# Patient Record
Sex: Male | Born: 1960 | Race: White | Hispanic: No | Marital: Married | Smoking: Former smoker
Health system: Southern US, Community
[De-identification: ages and names within clinical notes are randomized; demographics above are authoritative.]

## PROBLEM LIST (undated history)

## (undated) DIAGNOSIS — E785 Hyperlipidemia, unspecified: Secondary | ICD-10-CM

## (undated) DIAGNOSIS — H3320 Serous retinal detachment, unspecified eye: Secondary | ICD-10-CM

## (undated) DIAGNOSIS — I10 Essential (primary) hypertension: Secondary | ICD-10-CM

## (undated) DIAGNOSIS — I219 Acute myocardial infarction, unspecified: Secondary | ICD-10-CM

## (undated) DIAGNOSIS — J45909 Unspecified asthma, uncomplicated: Secondary | ICD-10-CM

## (undated) DIAGNOSIS — I2510 Atherosclerotic heart disease of native coronary artery without angina pectoris: Secondary | ICD-10-CM

## (undated) DIAGNOSIS — K219 Gastro-esophageal reflux disease without esophagitis: Secondary | ICD-10-CM

## (undated) HISTORY — PX: KNEE SURGERY: SHX244

## (undated) HISTORY — DX: Hyperlipidemia, unspecified: E78.5

## (undated) HISTORY — DX: Unspecified asthma, uncomplicated: J45.909

## (undated) HISTORY — PX: OTHER SURGICAL HISTORY: SHX169

## (undated) HISTORY — DX: Gastro-esophageal reflux disease without esophagitis: K21.9

## (undated) HISTORY — PX: CORONARY ARTERY BYPASS GRAFT: SHX141

## (undated) HISTORY — PX: TONSILLECTOMY: SUR1361

---

## 1999-11-22 ENCOUNTER — Ambulatory Visit (HOSPITAL_BASED_OUTPATIENT_CLINIC_OR_DEPARTMENT_OTHER): Admission: RE | Admit: 1999-11-22 | Discharge: 1999-11-22 | Payer: Self-pay | Admitting: Orthopaedic Surgery

## 2005-07-21 ENCOUNTER — Ambulatory Visit (HOSPITAL_BASED_OUTPATIENT_CLINIC_OR_DEPARTMENT_OTHER): Admission: RE | Admit: 2005-07-21 | Discharge: 2005-07-21 | Payer: Self-pay | Admitting: Internal Medicine

## 2005-07-30 ENCOUNTER — Ambulatory Visit: Payer: Self-pay | Admitting: Internal Medicine

## 2006-05-25 ENCOUNTER — Ambulatory Visit (HOSPITAL_BASED_OUTPATIENT_CLINIC_OR_DEPARTMENT_OTHER): Admission: RE | Admit: 2006-05-25 | Discharge: 2006-05-25 | Payer: Self-pay | Admitting: Orthopedic Surgery

## 2013-06-03 ENCOUNTER — Ambulatory Visit: Payer: BC Managed Care – PPO | Admitting: Emergency Medicine

## 2013-06-03 ENCOUNTER — Ambulatory Visit: Payer: BC Managed Care – PPO

## 2013-06-03 VITALS — BP 156/90 | HR 85 | Temp 97.9°F | Resp 18 | Ht 66.0 in | Wt 161.0 lb

## 2013-06-03 DIAGNOSIS — M25551 Pain in right hip: Secondary | ICD-10-CM

## 2013-06-03 DIAGNOSIS — M25559 Pain in unspecified hip: Secondary | ICD-10-CM

## 2013-06-03 DIAGNOSIS — I10 Essential (primary) hypertension: Secondary | ICD-10-CM

## 2013-06-03 MED ORDER — HYDROCODONE-ACETAMINOPHEN 5-325 MG PO TABS: 1.0000 | ORAL_TABLET | ORAL | Status: DC | PRN

## 2013-06-03 MED ORDER — NAPROXEN SODIUM 550 MG PO TABS: 550.0000 mg | ORAL_TABLET | Freq: Two times a day (BID) | ORAL | Status: AC

## 2013-06-03 MED ORDER — LISINOPRIL-HYDROCHLOROTHIAZIDE 20-12.5 MG PO TABS: 1.0000 | ORAL_TABLET | Freq: Every day | ORAL | Status: DC

## 2013-06-03 NOTE — Patient Instructions (Addendum)
Hip Pain  The hips join the upper legs to the lower pelvis. The bones, cartilage, tendons, and muscles of the hip joint perform a lot of work each day holding your body weight and allowing you to move around.  Hip pain is a common symptom. It can range from a minor ache to severe pain on 1 or both hips. Pain may be felt on the inside of the hip joint near the groin, or the outside near the buttocks and upper thigh. There may be swelling or stiffness as well. It occurs more often when a person walks or performs activity. There are many reasons hip pain can develop.  CAUSES   It is important to work with your caregiver to identify the cause since many conditions can impact the bones, cartilage, muscles, and tendons of the hips. Causes for hip pain include:  · Broken (fractured) bones.  · Separation of the thighbone from the hip socket (dislocation).  · Torn cartilage of the hip joint.  · Swelling (inflammation) of a tendon (tendonitis), the sac within the hip joint (bursitis), or a joint.  · A weakening in the abdominal wall (hernia), affecting the nerves to the hip.  · Arthritis in the hip joint or lining of the hip joint.  · Pinched nerves in the back, hip, or upper thigh.  · A bulging disc in the spine (herniated disc).  · Rarely, bone infection or cancer.  DIAGNOSIS   The location of your hip pain will help your caregiver understand what may be causing the pain. A diagnosis is based on your medical history, your symptoms, results from your physical exam, and results from diagnostic tests. Diagnostic tests may include X-ray exams, a computerized magnetic scan (magnetic resonance imaging, MRI), or bone scan.  TREATMENT   Treatment will depend on the cause of your hip pain. Treatment may include:  · Limiting activities and resting until symptoms improve.  · Crutches or other walking supports (a cane or brace).  · Ice, elevation, and compression.  · Physical therapy or home exercises.  · Shoe inserts or special  shoes.  · Losing weight.  · Medications to reduce pain.  · Undergoing surgery.  HOME CARE INSTRUCTIONS   · Only take over-the-counter or prescription medicines for pain, discomfort, or fever as directed by your caregiver.  · Put ice on the injured area:  · Put ice in a plastic bag.  · Place a towel between your skin and the bag.  · Leave the ice on for 15-20 minutes at a time, 3-4 times a day.  · Keep your leg raised (elevated) when possible to lessen swelling.  · Avoid activities that cause pain.  · Follow specific exercises as directed by your caregiver.  · Sleep with a pillow between your legs on your most comfortable side.  · Record how often you have hip pain, the location of the pain, and what it feels like. This information may be helpful to you and your caregiver.  · Ask your caregiver about returning to work or sports and whether you should drive.  · Follow up with your caregiver for further exams, therapy, or testing as directed.  SEEK MEDICAL CARE IF:   · Your pain or swelling continues or worsens after 1 week.  · You are feeling unwell or have chills.  · You have increasing difficulty with walking.  · You have a loss of sensation or other new symptoms.  · You have questions or concerns.  SEEK   IMMEDIATE MEDICAL CARE IF:   · You cannot put weight on the affected hip.  · You have fallen.  · You have a sudden increase in pain and swelling in your hip.  · You have a fever.  MAKE SURE YOU:   · Understand these instructions.  · Will watch your condition.  · Will get help right away if you are not doing well or get worse.  Document Released: 04/05/2010 Document Revised: 01/08/2012 Document Reviewed: 04/05/2010  ExitCare® Patient Information ©2014 [employer].

## 2013-06-03 NOTE — Progress Notes (Signed)
[facility] [address], [place] [address] [phone]  Date:  06/03/2013   Name:  Cheston Coury Louks   DOB:  05/05/1960   MRN:  [id number]  PCP:  No primary provider on file.    Chief Complaint: right leg pain   History of Present Illness:  Shawn Chang is a 52 y.o. very pleasant male patient who presents with the following:  Pain and unstable feeling in right hip as though the hip is out of place and slips back in as he walks and turns.   Has no history of hip injury or remote problems.  No fever or chills. No weakness in leg.  Pain worse with standing or walking and less with recumbent posture or ASA.  No improvement with over the counter medications or other home remedies.  Denies other complaint or health concern today.  Out of antihypertensive for 6 weeks   There are no active problems to display for this patient.   History reviewed. No pertinent past medical history.  History reviewed. No pertinent past surgical history.  History  Substance Use Topics  . Smoking status: Current Every Day Smoker -- 0.50 packs/day  . Smokeless tobacco: Not on file  . Alcohol Use: Not on file    Family History  Problem Relation Age of Onset  . Heart disease Mother   . Heart disease Father     No Known Allergies  Medication list has been reviewed and updated.  No current outpatient prescriptions on file prior to visit.   No current facility-administered medications on file prior to visit.    Review of Systems:  As per HPI, otherwise negative.    Physical Examination: Filed Vitals:   06/03/13 1522  BP: 156/90  Pulse: 85  Temp: 97.9 F (36.6 C)  Resp: 18   Filed Vitals:   06/03/13 1522  Height: 5\' 6"  (1.676 m)  Weight: 161 lb (73.029 kg)   Body mass index is 26 kg/(m^2). Ideal Body Weight: Weight in (lb) to have BMI = 25: 154.6   GEN: WDWN, NAD, Non-toxic, Alert & Oriented x 3 HEENT: Atraumatic, Normocephalic.  Ears and Nose: No  external deformity. EXTR: No clubbing/cyanosis/edema NEURO: Normal gait.  PSYCH: Normally interactive. Conversant. Not depressed or anxious appearing.  Calm demeanor.  RIGHT hip:  Full active and passive painless ROM.  No crepitus or tenderness  Assessment and Plan: Right hip pain vicodin Anaprox Ortho consult  Signed,  Phillips Odor, MD   [facility] reading (PRIMARY) by  Dr. Dareen Piano.  Negative .

## 2013-12-04 ENCOUNTER — Other Ambulatory Visit: Payer: Self-pay | Admitting: Emergency Medicine

## 2015-02-15 ENCOUNTER — Ambulatory Visit (INDEPENDENT_AMBULATORY_CARE_PROVIDER_SITE_OTHER): Payer: 59 | Admitting: Family Medicine

## 2015-02-15 VITALS — BP 184/98 | HR 96 | Temp 98.0°F | Resp 16 | Ht 65.5 in | Wt 161.4 lb

## 2015-02-15 DIAGNOSIS — I10 Essential (primary) hypertension: Secondary | ICD-10-CM

## 2015-02-15 MED ORDER — LISINOPRIL-HYDROCHLOROTHIAZIDE 20-12.5 MG PO TABS: 1.0000 | ORAL_TABLET | Freq: Every day | ORAL | Status: DC

## 2015-02-15 NOTE — Progress Notes (Signed)
° °  Subjective:  This chart was scribed for [name] MD by Veverly FellsHatice Demirci,scribe, at [facility].  This patient was seen in room 13 and the patient's care was started at 6:08 PM.    Patient ID: Shawn NestAlan J Lauf, male    DOB: 03/23/1961, 54 y.o.   MRN: [id number] Chief Complaint  Patient presents with   Medication Refill   HPI  HPI Comments: Shawn Chang is a 54 y.o. male with a history of hypertension who presents to [facility] for medication refill for his blood pressure which he has been out of for the past two weeks. His Lisinopril has been working well for him and he has no complaints regarding the medication.  Patient states he can tell when his blood pressure goes up.   He is looking for a physician to [facility] at [facility] since he is constantly traveling. 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     Review of Systems  Constitutional: Negative for fever and chills.  Respiratory: Negative for cough and shortness of breath.        Objective:   Physical Exam BP 184/98 mmHg   Pulse 96   Temp(Src) 98 F (36.7 C) (Oral)   Resp 16   Ht 5' 5.5" (1.664 m)   Wt 161 lb 6.4 oz (73.211 kg)   BMI 26.44 kg/m2   SpO2 98% Is a healthy-appearing middle-aged man in no acute distress Neck is supple Chest is clear Heart is regular without murmur or gallop Extremities show no edema     Assessment & Plan:   This chart was scribed in my presence and reviewed by me personally.    ICD-9-CM ICD-10-CM   1. Essential hypertension 401.9 I10 lisinopril-hydrochlorothiazide (PRINZIDE,ZESTORETIC) 20-12.5 MG per tablet     Signed, [name], MD \

## 2015-02-15 NOTE — Patient Instructions (Signed)
You need to have your blood pressure rechecked in 6-8 weeks..   Hypertension Hypertension, commonly called high blood pressure, is when the force of blood pumping through your arteries is too strong. Your arteries are the blood vessels that carry blood from your heart throughout your body. A blood pressure reading consists of a higher number over a lower number, such as 110/72. The higher number (systolic) is the pressure inside your arteries when your heart pumps. The lower number (diastolic) is the pressure inside your arteries when your heart relaxes. Ideally you want your blood pressure below 120/80. Hypertension forces your heart to work harder to pump blood. Your arteries may become narrow or stiff. Having hypertension puts you at risk for heart disease, stroke, and other problems.  RISK FACTORS Some risk factors for high blood pressure are controllable. Others are not.  Risk factors you cannot control include:   Race. You may be at higher risk if you are African American.  Age. Risk increases with age.  Gender. Men are at higher risk than women before age 54 years. After age 54, women are at higher risk than men. Risk factors you can control include:  Not getting enough exercise or physical activity.  Being overweight.  Getting too much fat, sugar, calories, or salt in your diet.  Drinking too much alcohol. SIGNS AND SYMPTOMS Hypertension does not usually cause signs or symptoms. Extremely high blood pressure (hypertensive crisis) may cause headache, anxiety, shortness of breath, and nosebleed. DIAGNOSIS  To check if you have hypertension, your health care provider will measure your blood pressure while you are seated, with your arm held at the level of your heart. It should be measured at least twice using the same arm. Certain conditions can cause a difference in blood pressure between your right and left arms. A blood pressure reading that is higher than normal on one occasion  does not mean that you need treatment. If one blood pressure reading is high, ask your health care provider about having it checked again. TREATMENT  Treating high blood pressure includes making lifestyle changes and possibly taking medicine. Living a healthy lifestyle can help lower high blood pressure. You may need to change some of your habits. Lifestyle changes may include:  Following the DASH diet. This diet is high in fruits, vegetables, and whole grains. It is low in salt, red meat, and added sugars.  Getting at least 2 hours of brisk physical activity every week.  Losing weight if necessary.  Not smoking.  Limiting alcoholic beverages.  Learning ways to reduce stress. If lifestyle changes are not enough to get your blood pressure under control, your health care provider may prescribe medicine. You may need to take more than one. Work closely with your health care provider to understand the risks and benefits. HOME CARE INSTRUCTIONS  Have your blood pressure rechecked as directed by your health care provider.   Take medicines only as directed by your health care provider. Follow the directions carefully. Blood pressure medicines must be taken as prescribed. The medicine does not work as well when you skip doses. Skipping doses also puts you at risk for problems.   Do not smoke.   Monitor your blood pressure at home as directed by your health care provider. SEEK MEDICAL CARE IF:   You think you are having a reaction to medicines taken.  You have recurrent headaches or feel dizzy.  You have swelling in your ankles.  You have trouble with your vision.  SEEK IMMEDIATE MEDICAL CARE IF:  You develop a severe headache or confusion.  You have unusual weakness, numbness, or feel faint.  You have severe chest or abdominal pain.  You vomit repeatedly.  You have trouble breathing. MAKE SURE YOU:   Understand these instructions.  Will watch your condition.  Will get  help right away if you are not doing well or get worse. Document Released: 10/16/2005 Document Revised: 03/02/2014 Document Reviewed: 08/08/2013 [facility] Patient Information 2015 [place], [place]. This information is not intended to replace advice given to you by your health care provider. Make sure you discuss any questions you have with your health care provider.

## 2015-08-30 ENCOUNTER — Emergency Department (HOSPITAL_COMMUNITY): Payer: PRIVATE HEALTH INSURANCE

## 2015-08-30 ENCOUNTER — Encounter (HOSPITAL_COMMUNITY): Payer: Self-pay | Admitting: Emergency Medicine

## 2015-08-30 ENCOUNTER — Emergency Department (HOSPITAL_COMMUNITY)
Admission: EM | Admit: 2015-08-30 | Discharge: 2015-08-31 | Disposition: A | Discharge status: Home / Self Care | Payer: PRIVATE HEALTH INSURANCE | Attending: Emergency Medicine | Admitting: Emergency Medicine

## 2015-08-30 DIAGNOSIS — R1013 Epigastric pain: Secondary | ICD-10-CM | POA: Diagnosis not present

## 2015-08-30 DIAGNOSIS — Z720 Tobacco use: Secondary | ICD-10-CM | POA: Insufficient documentation

## 2015-08-30 DIAGNOSIS — R142 Eructation: Secondary | ICD-10-CM | POA: Insufficient documentation

## 2015-08-30 DIAGNOSIS — I10 Essential (primary) hypertension: Secondary | ICD-10-CM | POA: Diagnosis not present

## 2015-08-30 DIAGNOSIS — Z79899 Other long term (current) drug therapy: Secondary | ICD-10-CM | POA: Diagnosis not present

## 2015-08-30 DIAGNOSIS — R002 Palpitations: Secondary | ICD-10-CM | POA: Diagnosis not present

## 2015-08-30 DIAGNOSIS — R110 Nausea: Secondary | ICD-10-CM | POA: Insufficient documentation

## 2015-08-30 DIAGNOSIS — R432 Parageusia: Secondary | ICD-10-CM | POA: Insufficient documentation

## 2015-08-30 DIAGNOSIS — R079 Chest pain, unspecified: Secondary | ICD-10-CM | POA: Diagnosis present

## 2015-08-30 HISTORY — DX: Essential (primary) hypertension: I10

## 2015-08-30 LAB — BASIC METABOLIC PANEL
ANION GAP: 9 (ref 5–15)
BUN: 17 mg/dL (ref 6–20)
CALCIUM: 9.2 mg/dL (ref 8.9–10.3)
CHLORIDE: 100 mmol/L — AB (ref 101–111)
CO2: 30 mmol/L (ref 22–32)
Creatinine, Ser: 1.42 mg/dL — ABNORMAL HIGH (ref 0.61–1.24)
GFR calc Af Amer: >60 mL/min (ref >60)
GFR calc non Af Amer: 55 mL/min — ABNORMAL LOW (ref >60)
GLUCOSE: 106 mg/dL — AB (ref 65–99)
Potassium: 3.4 mmol/L — ABNORMAL LOW (ref 3.5–5.1)
Sodium: 139 mmol/L (ref 135–145)

## 2015-08-30 LAB — CBC
HCT: 37.4 % — ABNORMAL LOW (ref 39.0–52.0)
HEMOGLOBIN: 12.4 g/dL — AB (ref 13.0–17.0)
MCH: 31.3 pg (ref 26.0–34.0)
MCHC: 33.2 g/dL (ref 30.0–36.0)
MCV: 94.4 fL (ref 78.0–100.0)
PLATELETS: 213 10*3/uL (ref 150–400)
RBC: 3.96 MIL/uL — AB (ref 4.22–5.81)
RDW: 13.3 % (ref 11.5–15.5)
WBC: 10.4 10*3/uL (ref 4.0–10.5)

## 2015-08-30 LAB — I-STAT TROPONIN, ED: TROPONIN I, POC: 0 ng/mL (ref 0.00–0.08)

## 2015-08-30 MED ORDER — ACETAMINOPHEN 500 MG PO TABS
1000.0000 mg | ORAL_TABLET | Freq: Once | ORAL | Status: AC
  Administered 2015-08-31: 1000 mg via ORAL
  Filled 2015-08-30: qty 2

## 2015-08-30 MED ORDER — ALUM & MAG HYDROXIDE-SIMETH 200-200-20 MG/5ML PO SUSP
15.0000 mL | Freq: Once | ORAL | Status: AC
  Administered 2015-08-31: 15 mL via ORAL
  Filled 2015-08-30: qty 30

## 2015-08-30 MED ORDER — ASPIRIN 81 MG PO CHEW
324.0000 mg | CHEWABLE_TABLET | Freq: Once | ORAL | Status: AC
  Administered 2015-08-31: 324 mg via ORAL
  Filled 2015-08-30: qty 4

## 2015-08-30 MED ORDER — NITROGLYCERIN 0.4 MG SL SUBL: 0.4000 mg | SUBLINGUAL_TABLET | SUBLINGUAL | Status: DC | PRN

## 2015-08-30 MED ORDER — LIDOCAINE VISCOUS 2 % MT SOLN
15.0000 mL | Freq: Once | OROMUCOSAL | Status: AC
  Administered 2015-08-31: 15 mL via OROMUCOSAL
  Filled 2015-08-30: qty 15

## 2015-08-30 NOTE — ED Notes (Signed)
Pt states he was at work today and 1st and 2nd finger of right hand began feeling numb. When he looked at his fingers, they were both white and it took about 30 minutes of rubbing fingers to get color back. Pt also reports metallic taste in mouth when chest pain radiated to jaw/teeth.

## 2015-08-30 NOTE — ED Notes (Signed)
Pt. reports central chest pressure " heart burn" , palpitations with SOB and dry cough onset today , denies nausea or diaphoresis .

## 2015-08-30 NOTE — ED Provider Notes (Signed)
CSN: [id number]   Arrival date & time 08/30/15 2146  History  By signing my name below, I, Shawn Chang, attest that this documentation has been prepared under the direction and in the presence of Melene Plan, DO. Electronically Signed: Bethel Chang, ED Scribe. 08/31/2015. 1:44 AM.  Chief Complaint  Patient presents with  . Chest Pain    HPI The history is provided by the patient. No language interpreter was used.   Shawn Chang is a 54 y.o. male with history of HTN who presents to the Emergency Department complaining of intermittent central chest pain with onset tonight around 9:30 PM. He describes the non-radiating pain as a "rolling pressure" and rates it 7/10 in severity with no modifying factors. This pain is not pleuritic in nature and is different than heartburn. Pt notes taking Prilosec daily.  Associated symptoms include a metallic taste is the mouth and dental pain at the onset of chest pain,racing palpitations, intermittent SOB, belching, and nausea. He notes a tremendous leg cramp 5 days ago that woke him from sleeping. Pt denies diaphoresis, vomiting, LE swelling.  Tobacco+. Family history of MI (an uncle at 1, father at 44, brother at 24). 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 No personal history of DVT/PE.   Past Medical History  Diagnosis Date  . Hypertension     Past Surgical History  Procedure Laterality Date  . Knee surgery    . Shoulder surgery      Family History  Problem Relation Age of Onset  . Heart disease Mother   . Heart disease Father     Social History  Substance Use Topics  . Smoking status: Current Every Day Smoker -- 0.00 packs/day    Types: Cigarettes  . Smokeless tobacco: None  . Alcohol Use: Yes     Review of Systems  Constitutional: Negative for fever, chills and diaphoresis.  HENT: Negative for congestion and facial swelling.   Eyes: Negative for discharge and visual disturbance.  Respiratory: Negative for shortness of breath.    Cardiovascular: Positive for chest pain and palpitations. Negative for leg swelling.  Gastrointestinal: Positive for nausea. Negative for vomiting, abdominal pain and diarrhea.       Belching Metallic taste in the mouth  Musculoskeletal: Negative for myalgias and arthralgias.  Skin: Negative for color change and rash.  Neurological: Negative for tremors, syncope and headaches.  Psychiatric/Behavioral: Negative for confusion and dysphoric mood.   Home Medications   Prior to Admission medications   Medication Sig Start Date End Date Taking? Authorizing Provider  Aspirin-Acetaminophen-Caffeine (GOODY HEADACHE PO) Take 1 packet by mouth every 3 (three) hours as needed (headache).   Yes Historical Provider, MD  cetirizine (ZYRTEC) 10 MG tablet Take 10 mg by mouth daily.   Yes Historical Provider, MD  lisinopril-hydrochlorothiazide (PRINZIDE,ZESTORETIC) 20-12.5 MG per tablet Take 1 tablet by mouth daily. 02/15/15  Yes Elvina Sidle, MD  omeprazole (PRILOSEC) 10 MG capsule Take 10 mg by mouth daily.   Yes Historical Provider, MD    Allergies  Review of patient's allergies indicates no known allergies.  Triage Vitals: BP 129/83 mmHg  Pulse 87  Temp(Src) 98.3 F (36.8 C) (Oral)  Resp 12  Ht  (1.676 m)  Wt 160 lb (72.576 kg)  BMI 25.84 kg/m2  SpO2 100%  Physical Exam  Constitutional: He is oriented to person, place, and time. He appears well-developed and well-nourished.  HENT:  Head: Normocephalic and atraumatic.  Eyes: EOM are normal. Pupils are equal,  round, and reactive to light.  Neck: Normal range of motion. Neck supple. No JVD present.  Cardiovascular: Normal rate and regular rhythm.  Exam reveals no gallop and no friction rub.   No murmur heard. DP Pulses 2+ and equal Pain is reproducible with palpation to the epigastrium.  Pulmonary/Chest: No respiratory distress. He has no wheezes.  Abdominal: Soft. He exhibits no distension. There is no tenderness. There is no  rebound and no guarding.  Musculoskeletal: Normal range of motion.  No noted edema.  Neurological: He is alert and oriented to person, place, and time.  Skin: No rash noted. No pallor.  Psychiatric: He has a normal mood and affect. His behavior is normal.  Nursing note and vitals reviewed.   ED Course  Procedures   DIAGNOSTIC STUDIES: Oxygen Saturation is 100% on RA, normal by my interpretation.    COORDINATION OF CARE: 11:56 PM Discussed treatment plan which includes CXR, lab work, EKG, a GI cocktail, NTG, and aspirin with pt at bedside and pt agreed to plan  1:43 AM I re-evaluated the patient and informed him of his negative second troponin.  Labs Reviewed  BASIC METABOLIC PANEL - Abnormal; Notable for the following:    Potassium 3.4 (*)    Chloride 100 (*)    Glucose, Bld 106 (*)    Creatinine, Ser 1.42 (*)    GFR calc non Af Amer 55 (*)    All other components within normal limits  CBC - Abnormal; Notable for the following:    RBC 3.96 (*)    Hemoglobin 12.4 (*)    HCT 37.4 (*)    All other components within normal limits  D-DIMER, QUANTITATIVE (NOT AT [facility])  I-STAT TROPOININ, ED  Rosezena Sensor, ED    Imaging Review Dg Chest 2 View  08/30/2015  CLINICAL DATA:  Mid chest pain and shortness of breath for 1 day EXAM: CHEST - 2 VIEW COMPARISON:  None. FINDINGS: The heart size and mediastinal contours are within normal limits. Both lungs are clear. The visualized skeletal structures are unremarkable. IMPRESSION: No active disease. Electronically Signed   By: Alcide Clever M.D.   On: 08/30/2015 22:53    I personally reviewed and evaluated these images and lab results as a part of my medical decision-making.   EKG Interpretation  Date/Time:  Monday August 30 2015 21:49:21 EDT Ventricular Rate:  109 PR Interval:  164 QRS Duration: 106 QT Interval:  328 QTC Calculation: 441 R Axis:   54 Text Interpretation:  Sinus tachycardia Otherwise normal ECG No old tracing to  compare Confirmed by Mayleigh Tetrault MD, DANIEL [phone]) on 08/30/2015 11:46:57 PM    MDM   Final diagnoses:  Chest pain, unspecified chest pain type    54 yo M with a chief complaint chest pain. This pain is epigastric comes and goes squeezing in nature. This occurred right after eating [place] food. Improved with belching. Pain is reproduced on palpation to the epigastrium. Patient with significant risk factors, will obtain a delta troponin. Patient also with significant travel history d-dimer negative. Given GI cocktail, with complete resolution of his pain. Delta troponin negative.  5:11 AM:  I have discussed the diagnosis/risks/treatment options with the patient and believe the pt to be eligible for discharge home to follow-up with PCP. We also discussed returning to the ED immediately if new or worsening sx occur. We discussed the sx which are most concerning (e.g., sudden worsening pain, fever, inability to tolerate by mouth) that necessitate immediate return.  Medications administered to the patient during their visit and any new prescriptions provided to the patient are listed below.  Medications given during this visit Medications  nitroGLYCERIN (NITROSTAT) SL tablet 0.4 mg (not administered)  alum & mag hydroxide-simeth (MAALOX/MYLANTA) 200-200-20 MG/5ML suspension 15 mL (15 mLs Oral Given 08/31/15 0019)  lidocaine (XYLOCAINE) 2 % viscous mouth solution 15 mL (15 mLs Mouth/Throat Given 08/31/15 0019)  aspirin chewable tablet 324 mg (324 mg Oral Given 08/31/15 0019)  acetaminophen (TYLENOL) tablet 1,000 mg (1,000 mg Oral Given 08/31/15 0018)    Discharge Medication List as of 08/31/2015  1:41 AM      The patient appears reasonably screen and/or stabilized for discharge and I doubt any other medical condition or other [facility] requiring further screening, evaluation, or treatment in the ED at this time prior to discharge.   I personally performed the services described in this documentation, which  was scribed in my presence. The recorded information has been reviewed and is accurate.     Melene Planan Kyli Sorter, DO 08/31/15 [phone]

## 2015-08-31 ENCOUNTER — Telehealth: Payer: Self-pay

## 2015-08-31 LAB — D-DIMER, QUANTITATIVE (NOT AT ARMC): D DIMER QUANT: 0.3 ug{FEU}/mL (ref 0.00–0.48)

## 2015-08-31 LAB — I-STAT TROPONIN, ED: TROPONIN I, POC: 0.01 ng/mL (ref 0.00–0.08)

## 2015-08-31 NOTE — Telephone Encounter (Signed)
Patient is calling because he would like a cardiology referral to to see Dr. Jacinto HalimGanji. Patient doesn't know the name of the office.  [phone]

## 2015-08-31 NOTE — Discharge Instructions (Signed)
Try Zantac twice a day for this pain. Nonspecific Chest Pain  Chest pain can be caused by many different conditions. There is always a chance that your pain could be related to something serious, such as a heart attack or a blood clot in your lungs. Chest pain can also be caused by conditions that are not life-threatening. If you have chest pain, it is very important to follow up with your health care provider. CAUSES  Chest pain can be caused by:  Heartburn.  Pneumonia or bronchitis.  Anxiety or stress.  Inflammation around your heart (pericarditis) or lung (pleuritis or pleurisy).  A blood clot in your lung.  A collapsed lung (pneumothorax). It can develop suddenly on its own (spontaneous pneumothorax) or from trauma to the chest.  Shingles infection (varicella-zoster virus).  Heart attack.  Damage to the bones, muscles, and cartilage that make up your chest wall. This can include:  Bruised bones due to injury.  Strained muscles or cartilage due to frequent or repeated coughing or overwork.  Fracture to one or more ribs.  Sore cartilage due to inflammation (costochondritis). RISK FACTORS  Risk factors for chest pain may include:  Activities that increase your risk for trauma or injury to your chest.  Respiratory infections or conditions that cause frequent coughing.  Medical conditions or overeating that can cause heartburn.  Heart disease or family history of heart disease.  Conditions or health behaviors that increase your risk of developing a blood clot.  Having had chicken pox (varicella zoster). SIGNS AND SYMPTOMS Chest pain can feel like:  Burning or tingling on the surface of your chest or deep in your chest.  Crushing, pressure, aching, or squeezing pain.  Dull or sharp pain that is worse when you move, cough, or take a deep breath.  Pain that is also felt in your back, neck, shoulder, or arm, or pain that spreads to any of these areas. Your chest  pain may come and go, or it may stay constant. DIAGNOSIS Lab tests or other studies may be needed to find the cause of your pain. Your health care provider may have you take a test called an ambulatory ECG (electrocardiogram). An ECG records your heartbeat patterns at the time the test is performed. You may also have other tests, such as:  Transthoracic echocardiogram (TTE). During echocardiography, sound waves are used to create a picture of all of the heart structures and to look at how blood flows through your heart.  Transesophageal echocardiogram (TEE).This is a more advanced imaging test that obtains images from inside your body. It allows your health care provider to see your heart in finer detail.  Cardiac monitoring. This allows your health care provider to monitor your heart rate and rhythm in real time.  Holter monitor. This is a portable device that records your heartbeat and can help to diagnose abnormal heartbeats. It allows your health care provider to track your heart activity for several days, if needed.  Stress tests. These can be done through exercise or by taking medicine that makes your heart beat more quickly.  Blood tests.  Imaging tests. TREATMENT  Your treatment depends on what is causing your chest pain. Treatment may include:  Medicines. These may include:  Acid blockers for heartburn.  Anti-inflammatory medicine.  Pain medicine for inflammatory conditions.  Antibiotic medicine, if an infection is present.  Medicines to dissolve blood clots.  Medicines to treat coronary artery disease.  Supportive care for conditions that do not require medicines.  This may include:  Resting.  Applying heat or cold packs to injured areas.  Limiting activities until pain decreases. HOME CARE INSTRUCTIONS  If you were prescribed an antibiotic medicine, finish it all even if you start to feel better.  Avoid any activities that bring on chest pain.  Do not use any  tobacco products, including cigarettes, chewing tobacco, or electronic cigarettes. If you need help quitting, ask your health care provider.  Do not drink alcohol.  Take medicines only as directed by your health care provider.  Keep all follow-up visits as directed by your health care provider. This is important. This includes any further testing if your chest pain does not go away.  If heartburn is the cause for your chest pain, you may be told to keep your head raised (elevated) while sleeping. This reduces the chance that acid will go from your stomach into your esophagus.  Make lifestyle changes as directed by your health care provider. These may include:  Getting regular exercise. Ask your health care provider to suggest some activities that are safe for you.  Eating a heart-healthy diet. A registered dietitian can help you to learn healthy eating options.  Maintaining a healthy weight.  Managing diabetes, if necessary.  Reducing stress. SEEK MEDICAL CARE IF:  Your chest pain does not go away after treatment.  You have a rash with blisters on your chest.  You have a fever. SEEK IMMEDIATE MEDICAL CARE IF:   Your chest pain is worse.  You have an increasing cough, or you cough up blood.  You have severe abdominal pain.  You have severe weakness.  You faint.  You have chills.  You have sudden, unexplained chest discomfort.  You have sudden, unexplained discomfort in your arms, back, neck, or jaw.  You have shortness of breath at any time.  You suddenly start to sweat, or your skin gets clammy.  You feel nauseous or you vomit.  You suddenly feel light-headed or dizzy.  Your heart begins to beat quickly, or it feels like it is skipping beats. These symptoms may represent a serious problem that is an emergency. Do not wait to see if the symptoms will go away. Get medical help right away. Call your local emergency services (911 in the [place] Do not drive yourself  to the hospital.   This information is not intended to replace advice given to you by your health care provider. Make sure you discuss any questions you have with your health care provider.   Document Released: 07/26/2005 Document Revised: 11/06/2014 Document Reviewed: 05/22/2014 Elsevier Interactive Patient Education [employer]

## 2015-09-01 NOTE — Telephone Encounter (Signed)
Left message for pt to call back.  Why does he want referral? Has not been here in months. He may need to RTC.

## 2015-09-01 NOTE — Telephone Encounter (Signed)
Patient returned call. He was seen in ED, thought he was having a MI. They said there was a 99% chance it wasn't and a 1% chance it was. He was given some names of cardiologist so he could get in with them.

## 2015-09-09 ENCOUNTER — Encounter: Payer: Self-pay | Admitting: Physician Assistant

## 2015-09-09 ENCOUNTER — Ambulatory Visit (INDEPENDENT_AMBULATORY_CARE_PROVIDER_SITE_OTHER): Payer: Managed Care, Other (non HMO) | Admitting: Physician Assistant

## 2015-09-09 VITALS — BP 113/71 | HR 110 | Temp 98.8°F | Resp 18 | Ht 66.5 in | Wt 157.4 lb

## 2015-09-09 DIAGNOSIS — Z87898 Personal history of other specified conditions: Secondary | ICD-10-CM

## 2015-09-09 DIAGNOSIS — Z09 Encounter for follow-up examination after completed treatment for conditions other than malignant neoplasm: Secondary | ICD-10-CM

## 2015-09-09 DIAGNOSIS — Z299 Encounter for prophylactic measures, unspecified: Secondary | ICD-10-CM

## 2015-09-09 DIAGNOSIS — I209 Angina pectoris, unspecified: Secondary | ICD-10-CM | POA: Insufficient documentation

## 2015-09-09 DIAGNOSIS — R062 Wheezing: Secondary | ICD-10-CM | POA: Diagnosis not present

## 2015-09-09 DIAGNOSIS — Z418 Encounter for other procedures for purposes other than remedying health state: Secondary | ICD-10-CM

## 2015-09-09 DIAGNOSIS — Z139 Encounter for screening, unspecified: Secondary | ICD-10-CM | POA: Diagnosis not present

## 2015-09-09 LAB — LIPID PANEL
CHOLESTEROL: 105 mg/dL — AB (ref 125–200)
HDL: 31 mg/dL — ABNORMAL LOW (ref >=40)
LDL Cholesterol: 58 mg/dL (ref <130)
Total CHOL/HDL Ratio: 3.4 Ratio (ref <=5.0)
Triglycerides: 80 mg/dL (ref <150)
VLDL: 16 mg/dL (ref <30)

## 2015-09-09 LAB — COMPLETE METABOLIC PANEL WITH GFR
ALT: 17 U/L (ref 9–46)
AST: 23 U/L (ref 10–35)
Albumin: 4 g/dL (ref 3.6–5.1)
Alkaline Phosphatase: 63 U/L (ref 40–115)
BUN: 16 mg/dL (ref 7–25)
CHLORIDE: 103 mmol/L (ref 98–110)
CO2: 27 mmol/L (ref 20–31)
CREATININE: 1.1 mg/dL (ref 0.70–1.33)
Calcium: 9.1 mg/dL (ref 8.6–10.3)
GFR, Est African American: 88 mL/min (ref >=60)
GFR, Est Non African American: 76 mL/min (ref >=60)
Glucose, Bld: 93 mg/dL (ref 65–99)
POTASSIUM: 3.8 mmol/L (ref 3.5–5.3)
Sodium: 137 mmol/L (ref 135–146)
Total Bilirubin: 0.4 mg/dL (ref 0.2–1.2)
Total Protein: 6.5 g/dL (ref 6.1–8.1)

## 2015-09-09 LAB — POCT GLYCOSYLATED HEMOGLOBIN (HGB A1C): HEMOGLOBIN A1C: 5.2

## 2015-09-09 LAB — HEMOGLOBIN A1C: HEMOGLOBIN A1C: 5.2 % (ref 4.0–6.0)

## 2015-09-09 LAB — TSH: TSH: 0.597 u[IU]/mL (ref 0.350–4.500)

## 2015-09-09 MED ORDER — NITROGLYCERIN 0.3 MG SL SUBL: 0.3000 mg | SUBLINGUAL_TABLET | SUBLINGUAL | Status: DC | PRN

## 2015-09-09 MED ORDER — ALBUTEROL SULFATE HFA 108 (90 BASE) MCG/ACT IN AERS: 2.0000 | INHALATION_SPRAY | Freq: Four times a day (QID) | RESPIRATORY_TRACT | Status: DC | PRN

## 2015-09-09 MED ORDER — DOXYCYCLINE HYCLATE 100 MG PO CAPS: 100.0000 mg | ORAL_CAPSULE | Freq: Two times a day (BID) | ORAL | Status: AC

## 2015-09-09 MED ORDER — ALBUTEROL SULFATE (2.5 MG/3ML) 0.083% IN NEBU
2.5000 mg | INHALATION_SOLUTION | Freq: Once | RESPIRATORY_TRACT | Status: AC
  Administered 2015-09-09: 2.5 mg via RESPIRATORY_TRACT

## 2015-09-09 MED ORDER — ASPIRIN 81 MG PO TABS: 81.0000 mg | ORAL_TABLET | Freq: Every day | ORAL | Status: DC

## 2015-09-09 MED ORDER — IPRATROPIUM BROMIDE 0.02 % IN SOLN
0.5000 mg | Freq: Once | RESPIRATORY_TRACT | Status: AC
  Administered 2015-09-09: 0.5 mg via RESPIRATORY_TRACT

## 2015-09-09 MED ORDER — ALBUTEROL SULFATE (2.5 MG/3ML) 0.083% IN NEBU: 2.5000 mg | INHALATION_SOLUTION | Freq: Four times a day (QID) | RESPIRATORY_TRACT | Status: DC | PRN

## 2015-09-09 MED ORDER — FLUTICASONE PROPIONATE 50 MCG/ACT NA SUSP: 2.0000 | Freq: Every day | NASAL | Status: DC

## 2015-09-09 NOTE — Progress Notes (Signed)
09/09/2015 at 2:13 PM  Shawn Chang / DOB: Sep 26, 1961 / MRN: [id number]  The patient has Essential hypertension and History of chest pain on his problem list.  SUBJECTIVE  Shawn Chang is a 54 y.o. male who complains of need for follow up after a visit to the ED on 09/01/15.  He requested a referral to cardiology via phone messaging and was advised by staff to come into the clinic.  He is 54 years old and there is a paucity of data regarding his health.  He has a history of HTN and was placed on Lisinopril-HCTZ 20-12.5 which appears to be a historical medication and refilled by this office.  He reports a family history of CAD in his mother and father.  He is a current smoker. BMI ranges from 25-26 since 2014.  Lipids have never been quantified. Most recent blood sugar at 106, no A1C in [facility].  Per [facility] he is behind on his vaccinations.  No colonoscopy as of yet. Most recent visit to the ED as follows:  "Shawn Chang is a 54 y.o. male with history of HTN who presents to the Emergency Department complaining of intermittent central chest pain with onset tonight around 9:30 PM. He describes the non-radiating pain as a "rolling pressure" and rates it 7/10 in severity with no modifying factors. This pain is not pleuritic in nature and is different than heartburn. Pt notes taking Prilosec daily. Associated symptoms include a metallic taste is the mouth and dental pain at the onset of chest pain,racing palpitations, intermittent SOB, belching, and nausea. He notes a tremendous leg cramp 5 days ago that woke him from sleeping. Pt denies diaphoresis, vomiting, LE swelling. Tobacco+. Family history of MI (an uncle at 78, father at 30, brother at 13). 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 No personal history of DVT/PE".    He was given ASA 324, Tylenol, and a GI cocktail with complete resolution of his symptoms at that time.   Since his visit to the ED he has been complaining of shortness of breath and  fatigue.  Reports that when he travels he ty he can typically walk at the airport with a 40 lbs pack without difficulty.  Since his visit to the ED he reports he has been riding the tram as he gets short of breath with normal physical activity.   He complains of a head cold and chest congestion at this time that has been occuring for roughly 4 days now.  Denies fever, chills, nausea, facial pressure.  Has a 40 pack year history of smoking.    Complains that his fingers have been turning white when he is outside.  Reports the digits burn and tingle, particularly upon rewarming which takes roughly thirty minutes.    He  has a past medical history of Hypertension.    Medications reviewed and updated by myself where necessary, and exist elsewhere in the encounter.   Shawn Chang has No Known Allergies. He  reports that he has been smoking Cigarettes.  He does not have any smokeless tobacco history on file. He reports that he drinks alcohol. He reports that he does not use illicit drugs. He  has no sexual activity history on file. The patient  has past surgical history that includes Knee surgery and shoulder surgery.  His family history includes Heart disease in his father and mother.  Review of Systems  Constitutional: Negative for fever and chills.  Respiratory: Negative for shortness  of breath.   Cardiovascular: Negative for chest pain.  Gastrointestinal: Negative for nausea and abdominal pain.  Genitourinary: Negative.   Skin: Negative for rash.  Neurological: Negative for dizziness and headaches.    OBJECTIVE  His  height is 5' 6.5" (1.689 m) and weight is 157 lb 6.4 oz (71.396 kg). His oral temperature is 98.8 F (37.1 C). His blood pressure is 113/71 and his pulse is 110. His respiration is 18 and oxygen saturation is 95%.  The patient's body mass index is 25.03 kg/(m^2).  Physical Exam  Vitals reviewed. Constitutional: He is oriented to person, place, and time. He appears  well-developed. No distress.  Eyes: EOM are normal. Pupils are equal, round, and reactive to light. No scleral icterus.  Neck: Normal range of motion.  Cardiovascular: Normal rate and regular rhythm.   Respiratory: Effort normal and breath sounds normal.  GI: He exhibits no distension.  Musculoskeletal: Normal range of motion.  Neurological: He is alert and oriented to person, place, and time. No cranial nerve deficit.  Skin: Skin is warm and dry. No rash noted. He is not diaphoretic.  Psychiatric: He has a normal mood and affect.     EXAM: CHEST - 2 VIEW  COMPARISON: None.  FINDINGS: The heart size and mediastinal contours are within normal limits. Both lungs are clear. The visualized skeletal structures are unremarkable.  IMPRESSION: No active disease.  EKG 10/31: Sinus tachy with rate of 109.  Questionable ST segment elevation of leads I, III, and AVF.    Recent Results (from the past 2160 hour(s))  D-dimer, quantitative (not at [facility])     Status: None   Collection Time: 08/30/15 12:08 AM  Result Value Ref Range   D-Dimer, Quant 0.30 0.00 - 0.48 ug/mL-FEU    Comment:        AT THE INHOUSE ESTABLISHED CUTOFF VALUE OF 0.48 ug/mL FEU, THIS ASSAY HAS BEEN DOCUMENTED IN THE LITERATURE TO HAVE A SENSITIVITY AND NEGATIVE PREDICTIVE VALUE OF AT LEAST 98 TO 99%.  THE TEST RESULT SHOULD BE CORRELATED WITH AN ASSESSMENT OF THE CLINICAL PROBABILITY OF DVT / VTE.   Basic metabolic panel     Status: Abnormal   Collection Time: 08/30/15 10:16 PM  Result Value Ref Range   Sodium 139 135 - 145 mmol/L   Potassium 3.4 (L) 3.5 - 5.1 mmol/L   Chloride 100 (L) 101 - 111 mmol/L   CO2 30 22 - 32 mmol/L   Glucose, Bld 106 (H) 65 - 99 mg/dL   BUN 17 6 - 20 mg/dL   Creatinine, Ser 1.42 (H) 0.61 - 1.24 mg/dL   Calcium 9.2 8.9 - 10.3 mg/dL   GFR calc non Af Amer 55 (L) >60 mL/min   GFR calc Af Amer >60 >60 mL/min    Comment: (NOTE) The eGFR has been calculated using the CKD EPI  equation. This calculation has not been validated in all clinical situations. eGFR's persistently <60 mL/min signify possible Chronic Kidney Disease.    Anion gap 9 5 - 15  CBC     Status: Abnormal   Collection Time: 08/30/15 10:16 PM  Result Value Ref Range   WBC 10.4 4.0 - 10.5 K/uL   RBC 3.96 (L) 4.22 - 5.81 MIL/uL   Hemoglobin 12.4 (L) 13.0 - 17.0 g/dL   HCT 37.4 (L) 39.0 - 52.0 %   MCV 94.4 78.0 - 100.0 fL   MCH 31.3 26.0 - 34.0 pg   MCHC 33.2 30.0 - 36.0 g/dL  RDW 13.3 11.5 - 15.5 %   Platelets 213 150 - 400 K/uL  I-stat troponin, ED     Status: None   Collection Time: 08/30/15 10:25 PM  Result Value Ref Range   Troponin i, poc 0.00 0.00 - 0.08 ng/mL   Comment 3            Comment: Due to the release kinetics of cTnI, a negative result within the first hours of the onset of symptoms does not rule out myocardial infarction with certainty. If myocardial infarction is still suspected, repeat the test at appropriate intervals.   I-stat troponin, ED     Status: None   Collection Time: 08/31/15  1:13 AM  Result Value Ref Range   Troponin i, poc 0.01 0.00 - 0.08 ng/mL   Comment 3            Comment: Due to the release kinetics of cTnI, a negative result within the first hours of the onset of symptoms does not rule out myocardial infarction with certainty. If myocardial infarction is still suspected, repeat the test at appropriate intervals.   POCT glycosylated hemoglobin (Hb A1C)     Status: None   Collection Time: 09/09/15  2:08 PM  Result Value Ref Range   Hemoglobin A1C 5.2       ASSESSMENT & PLAN  Shawn Chang was seen today for follow-up, cough and wheezing.  Diagnoses and all orders for this visit:  Follow-up examination: I am very concerned about this patient's symptoms and his risk factors.  EKG sent to Dr. Andria Meuse office for review, who felt his EKG was non emergent. We will stat refer this patient regardless, as he has a history of smoking, HTN, and family  history of early CAD.  Will await labs.  Prescribing Nitroglycerin and have advised that he stay close to [facility] until seeing Dr. Tamsen Snider next week.    History of chest pain -     Ambulatory referral to Cardiology -     Nitroglycerin 0.3 mg prn -     Referral to Dr. Tamsen Snider next Wednesday.   Screening -     Lipid panel -     TSH -     HIV antibody -     Hepatitis C antibody -     POCT glycosylated hemoglobin (Hb A1C) -     COMPLETE METABOLIC PANEL WITH GFR  Need for prophylactic measure -     aspirin 81 MG tablet; Take 1 tablet (81 mg total) by mouth daily. Take one daily. -     Flu Vaccine QUAD 36+ mos IM -     Tdap vaccine greater than or equal to 7yo IM  Wheezing: Most likely secondary to long history of smoking.  Will bring back in one month for further evaluation of this problem when he is well to establish a baseline.  Will write for Doxy for now along with an Albuterol inhaler. Given sinus involvement will start Flonase.        The patient was advised to call or come back to clinic if he does not see an improvement in symptoms, or worsens with the above plan.   Philis Fendt, MHS, PA-C [facility] 09/09/2015 2:13 PM

## 2015-09-09 NOTE — Patient Instructions (Addendum)
You have an appointment with Dr. Christie NottinghamGhangi's office at 2:30 on Wednesday November 16th.    Please stop smoking as quickly as possible.  I suggest that you buy one month's worth of 14 mg patches and 2 mg gum.  Wear the patch daily and when you have a craving then use the gum.

## 2015-09-10 LAB — HEPATITIS C ANTIBODY: HCV AB: NEGATIVE

## 2015-09-10 LAB — HIV ANTIBODY (ROUTINE TESTING W REFLEX): HIV 1&2 Ab, 4th Generation: NONREACTIVE

## 2015-09-24 DIAGNOSIS — R9439 Abnormal result of other cardiovascular function study: Secondary | ICD-10-CM | POA: Diagnosis present

## 2015-09-24 DIAGNOSIS — I2584 Coronary atherosclerosis due to calcified coronary lesion: Secondary | ICD-10-CM

## 2015-09-24 DIAGNOSIS — I2510 Atherosclerotic heart disease of native coronary artery without angina pectoris: Secondary | ICD-10-CM

## 2015-09-24 NOTE — H&P (Signed)
OFFICE VISIT NOTES COPIED TO EPIC FOR DOCUMENTATION  Tami Ribaslan Finnell 09/15/2015 1:23 PM Location: [facility] Patient #: [id number] DOB: 06/27/1961 Married / Language: [place] / Race: White Male   History of Present Illness Marcy Salvo(Bridgette Allison AGNP-C; 09/15/2015 9:02 PM) The patient is a 54 year old male who presents with chest pain. He presented to the ED on 08/30/2015 after experiencing sudden onset chest discomfort while sitting on his couch at home, accompanied by trouble catching his breath and sweating. ACS was ruled out and he followed up with his PCP the next week. He was told that his EKG looked different than the one performed during ED visit and he was referred here for further evaluation.  He denies any episode as severe as the one he had initially, however, has continued to have intermittent episodes of chest pain and dyspnea and feels more fatigued since that episode. He has a significant family history of CAD and a personal history of HTN and tobacco use with an approximate 30-40 pack year history of smoking. Fortunately, lipids are controlled and no hisotry of diabetes.    Problem List/Past Medical Alysia Penna(Charavina Reader; 09/15/2015 2:55 PM) Benign essential hypertension (I10)  Allergies (Charavina Reader; 09/15/2015 2:55 PM) No Known Drug Allergies11/16/2016  Family History Marcy Salvo(Bridgette Allison, AGNP-C; 09/15/2015 3:40 PM) Mother In poor health. 54 yrs old; CHF, HTN, CABG x 4 at age 54; MI at age 54; no strokes or other cardiovascular conditions Father Deceased. at age 54 from MI complications; first MI in his 5450s; patient unsure if father had any other cardiovascular conditions; no strokes Sister 1 In stable health. 16 yrs older; CVA but no other cardiovascular conditions Sister 2 In stable health. 9 yrs older; no cardiovascular conditions Brother 1 In stable health. 14 yrs older; MI age 54 Brother 2 In good health. 8 yrs older; HTN but no other  cardiovascular conditions  Social History Alysia Penna(Charavina Reader; 09/15/2015 3:04 PM) Current tobacco use Current every day smoker. 1/2 ppd-1ppd Alcohol Use Occasional alcohol use. Marital status Married. Living Situation Lives with spouse. Number of Children 2.  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 ZyrTEC Allergy (10MG  Capsule, 1 Oral daily) Active. Lisinopril-Hydrochlorothiazide (20-12.5MG  Tablet, 1 Oral daily) Active. Nitrostat (0.3MG  Tab Sublingual, 1 Sublingual as needed for chest pain) Active. Ventolin HFA (108 (90 Base)MCG/ACT Aerosol Soln, 1 puff Inhalation as needed) Active. Doxycycline Hyclate (100MG  Capsule, 1 Oral two times daily for ten days) Active. Fluticasone Propionate (50MCG/ACT Suspension, 1 spray each nostril Nasal as needed) Active. PriLOSEC OTC (20MG  Tablet DR, 1 Oral as needed) Active. Medications Reconciled  Diagnostic Studies History Alysia Penna(Charavina Reader; 09/15/2015 3:08 PM) Loney LaurenceLabwork 09/09/2015: Total cholesterol 105, triglycerides 80, HDL 31, LDL 58, TSH 0.597, creatinine 1.10, potassium 3.8, CMP normal, HbA1c 5.2% 08/30/2015: Creatinine 1.42, potassium 3.4, CBC normal, d-dimer negative, delta troponin negative  2 Chest X-ray10/31/2016 Negative Treadmill stress test2000 Echocardiogram2000    Review of Systems (Bridgette Allison AGNP-C; 09/15/2015 9:02 PM) General Present- Fatigue. Not Present- Anorexia and Fever. Respiratory Present- Dyspnea. Not Present- Cough and Decreased Exercise Tolerance. Cardiovascular Present- Chest Pain. Not Present- Claudications, Edema, Orthopnea, Paroxysmal Nocturnal Dyspnea and Shortness of Breath. Gastrointestinal Not Present-  Change in Bowel Habits, Constipation and Nausea. Neurological Not Present- Focal Neurological Symptoms. Endocrine Not Present- Appetite  Changes, Cold Intolerance and Heat Intolerance. Hematology Not Present- Anemia, Petechiae and Prolonged Bleeding.  Vitals Alysia Penna Reader; 09/15/2015 2:50 PM) 09/15/2015 2:46 PM Weight: 155 lb Height: 66in Body Surface Area: 1.79 m Body Mass Index: 25.02 kg/m  Pulse: 93 (Regular)  P.OX: 97% (Room air) BP: 130/86 (Sitting, Left Arm, Standard)       Physical Exam (Bridgette Allison AGNP-C; 09/15/2015 9:07 PM) General Mental Status-Alert. General Appearance-Cooperative, Appears stated age, Not in acute distress. Orientation-Oriented X3. Build & Nutrition-Well nourished and Moderately built.  Head and Neck Thyroid Gland Characteristics - no palpable nodules, no palpable enlargement.  Chest and Lung Exam Palpation Tender - No chest wall tenderness. Auscultation Breath sounds - Clear. Adventitious sounds - Expiratory wheeze - Both Lung Fields. Coarse rales - Both Lung Fields.  Cardiovascular Inspection Jugular vein - Right - No Distention. Auscultation Heart Sounds - S1 WNL, S2 WNL and No gallop present. Murmurs & Other Heart Sounds - Murmur - No murmur.  Abdomen Palpation/Percussion Palpation and Percussion of the abdomen reveal - Non Tender and No hepatosplenomegaly. Auscultation Auscultation of the abdomen reveals - Bowel sounds normal.  Peripheral Vascular Lower Extremity Inspection - Left - No Pigmentation, No Varicose veins. Right - No Pigmentation, No Varicose veins. Palpation - Edema - Left - No edema. Right - No edema. Femoral pulse - Left - Normal. Right - Normal. Popliteal pulse - Left - Normal. Right - Normal. Dorsalis pedis pulse - Left - Normal. Right - Normal. Posterior tibial pulse - Left - Normal. Right - Normal. Carotid arteries - Left-No Carotid bruit. Carotid arteries - Right-No Carotid  bruit. Abdomen-No prominent abdominal aortic pulsation, No epigastric bruit.  Neurologic Motor-Grossly intact without any focal deficits.  Musculoskeletal Global Assessment Left Lower Extremity - normal range of motion without pain. Right Lower Extremity - normal range of motion without pain.    Assessment & Plan (Bridgette Revonda Standard AGNP-C; 09/15/2015 9:06 PM) Chest pain, atypical (R07.89) Impression: EKG 09/15/2015: Normal sinus rhythm at rate of 86 bpm, normal axis, T wave inversion in the inferior leads cannot exclude ischemia. Current Plans Complete electrocardiogram (93000) Pt Education - Smoking: Ways to Quit: addiction Cardiac calcium scoring (16109) Future Plans 09/28/2015: Echocardiography, transthoracic, real-time with image documentation (2D), includes M-mode recording, when performed, complete, with spectral Doppler echocardiography, and with color flow Doppler echocardiography (60454) - one time 09/17/2015: Myocardial perfusion imaging, tomographic (SPECT) (including attenuation correction, qualitative or quantitative wall motion, ejection fraction by first pass or gated technique, additional quantification, when performed) - one time Benign essential hypertension (I10) Tobacco use disorder (F17.200) Family history of premature CAD (Z82.49) Current Plans Mechanism of underlying disease process and action of medications discussed with the patient. I discussed primary/secondary prevention and also dietary counseling was done. He presents for evaluation of chest pain, with somewhat atypical presentation, however with multiple risk factors including family history of premature CAD, HTN, and current tobacco use. Schedule for exercise myoview to evaluate for ischemic etiology of symptoms as well as echocardiogram to evaluate for structural abnormalities. Coronary calcium scoring was also ordered to further risk stratify due to strong family history of CAD, however, with normal  LDL. Smoking cessation was discussed at length, both by myself and by Dr. Jacinto Halim. Pt is willing to quit and seems motivated. Follow up after testing for reevaluation and further recommendation.  *I have discussed this case with Dr. Jacinto Halim and he personally examined the patient and participated in formulating the plan.*    Signed by Marcy Salvo, AGNP-C (09/15/2015  9:07 PM)   Addendum: Abnormal nuclear stress test (R94.39) Story: Exercise sestamibi stress test 09/17/2015: 1. The resting electrocardiogram demonstrated normal sinus rhythm, normal resting conduction, no resting arrhythmias and normal rest repolarization. The stress electrocardiogram was normal. The patient performed treadmill exercise using a Bruce protocol, completing 6:19 minutes. The patient completed an estimated workload of 7.50 METS, 87% of the maximum predicted heart rate. The stress test was terminated because of attainment of target heart rate and dyspnea. 2. The perfusion imaging study demonstrates a moderate-sized severe inferior wall ischemia extending from the base towards the mid ventricle and in walls the inferoseptal region. Left ventricular systolic function Calculated by QGS was 51% with inferior and inferoseptal hypokinesis. This is an intermediate risk study, clinical correlation recommended. Chest pain, atypical (R07.89) Impression: EKG 09/15/2015: Normal sinus rhythm at rate of 86 bpm, normal axis, T wave inversion in the inferior leads cannot exclude ischemia. Current Plans METABOLIC PANEL, BASIC (40981) CBC & PLATELETS (AUTO) (85027) PT (PROTHROMBIN TIME) (19147)  Coronary Calcium Scoring 11/182016: Very high calcium score of 2879. Large amount of plaque seen in all major arteries. Consistent with extensive atherosclerotic plaque. High likelihood of at least one signficant coronary narrowing.  Recommendation:  Discussed nuclear stress test and coronary calcium scoring results again. Pt had labwork this  morning. Beging atorvastatin and metoprolol as ordered yesterday. Continues to have intermittent episodes of chest discomfort, although not as severe as initial episode. Keeps SL Ntg with him but has not needed to use. Schedule for cardiac catheterization, and possible angioplasty. We discussed regarding risks, benefits, alternatives to this including stress testing, CTA and continued medical therapy. Patient wants to proceed. Understands <1-2% risk of death, stroke, MI, urgent CABG, bleeding, infection, renal failure but not limited to these. Again discussed smoking cessation. Wellbutrin given for assistance. Keep appt for echocardiogram. Follow up after cath.

## 2015-09-30 ENCOUNTER — Encounter: Payer: Self-pay | Admitting: Family Medicine

## 2015-10-01 ENCOUNTER — Ambulatory Visit (HOSPITAL_COMMUNITY)
Admission: RE | Admit: 2015-10-01 | Discharge: 2015-10-01 | Disposition: A | Discharge status: Home / Self Care | Payer: PRIVATE HEALTH INSURANCE | Source: Ambulatory Visit | Attending: Cardiology | Admitting: Cardiology

## 2015-10-01 ENCOUNTER — Encounter (HOSPITAL_COMMUNITY)
Admission: RE | Disposition: A | Discharge status: Home / Self Care | Payer: Self-pay | Source: Ambulatory Visit | Attending: Cardiology

## 2015-10-01 ENCOUNTER — Encounter (HOSPITAL_COMMUNITY): Payer: Self-pay | Admitting: Cardiology

## 2015-10-01 DIAGNOSIS — I25118 Atherosclerotic heart disease of native coronary artery with other forms of angina pectoris: Secondary | ICD-10-CM | POA: Insufficient documentation

## 2015-10-01 DIAGNOSIS — F17210 Nicotine dependence, cigarettes, uncomplicated: Secondary | ICD-10-CM | POA: Insufficient documentation

## 2015-10-01 DIAGNOSIS — I2510 Atherosclerotic heart disease of native coronary artery without angina pectoris: Secondary | ICD-10-CM

## 2015-10-01 DIAGNOSIS — Z8249 Family history of ischemic heart disease and other diseases of the circulatory system: Secondary | ICD-10-CM | POA: Diagnosis not present

## 2015-10-01 DIAGNOSIS — I2584 Coronary atherosclerosis due to calcified coronary lesion: Secondary | ICD-10-CM

## 2015-10-01 DIAGNOSIS — I10 Essential (primary) hypertension: Secondary | ICD-10-CM | POA: Diagnosis not present

## 2015-10-01 DIAGNOSIS — R9439 Abnormal result of other cardiovascular function study: Secondary | ICD-10-CM | POA: Diagnosis present

## 2015-10-01 DIAGNOSIS — I209 Angina pectoris, unspecified: Secondary | ICD-10-CM | POA: Diagnosis present

## 2015-10-01 HISTORY — PX: CARDIAC CATHETERIZATION: SHX172

## 2015-10-01 SURGERY — LEFT HEART CATH AND CORONARY ANGIOGRAPHY
Anesthesia: LOCAL

## 2015-10-01 MED ORDER — HYDROMORPHONE HCL 1 MG/ML IJ SOLN
INTRAMUSCULAR | Status: DC | PRN
  Administered 2015-10-01 (×2): 0.5 mg via INTRAVENOUS

## 2015-10-01 MED ORDER — SODIUM CHLORIDE 0.9 % IJ SOLN: 3.0000 mL | INTRAMUSCULAR | Status: DC | PRN

## 2015-10-01 MED ORDER — SODIUM CHLORIDE 0.9 % IV SOLN: 250.0000 mL | INTRAVENOUS | Status: DC | PRN

## 2015-10-01 MED ORDER — IOHEXOL 350 MG/ML SOLN
INTRAVENOUS | Status: DC | PRN
Start: 2015-10-01 — End: 2015-10-01
  Administered 2015-10-01: 90 mL via INTRAVENOUS

## 2015-10-01 MED ORDER — ASPIRIN 81 MG PO CHEW
81.0000 mg | CHEWABLE_TABLET | ORAL | Status: AC
  Administered 2015-10-01: 81 mg via ORAL

## 2015-10-01 MED ORDER — ASPIRIN 81 MG PO CHEW: 81.0000 mg | CHEWABLE_TABLET | Freq: Every day | ORAL | Status: DC

## 2015-10-01 MED ORDER — MIDAZOLAM HCL 2 MG/2ML IJ SOLN
INTRAMUSCULAR | Status: DC | PRN
  Administered 2015-10-01: 2 mg via INTRAVENOUS

## 2015-10-01 MED ORDER — SODIUM CHLORIDE 0.9 % WEIGHT BASED INFUSION: 3.0000 mL/kg/h | INTRAVENOUS | Status: DC

## 2015-10-01 MED ORDER — NITROGLYCERIN 1 MG/10 ML FOR IR/CATH LAB
INTRA_ARTERIAL | Status: DC | PRN
  Administered 2015-10-01: 08:00:00

## 2015-10-01 MED ORDER — NITROGLYCERIN 1 MG/10 ML FOR IR/CATH LAB
INTRA_ARTERIAL | Status: AC
  Filled 2015-10-01: qty 10

## 2015-10-01 MED ORDER — HEPARIN SODIUM (PORCINE) 1000 UNIT/ML IJ SOLN
INTRAMUSCULAR | Status: AC
  Filled 2015-10-01: qty 1

## 2015-10-01 MED ORDER — LIDOCAINE HCL (PF) 1 % IJ SOLN
INTRAMUSCULAR | Status: AC
  Filled 2015-10-01: qty 30

## 2015-10-01 MED ORDER — VERAPAMIL HCL 2.5 MG/ML IV SOLN
INTRAVENOUS | Status: AC
  Filled 2015-10-01: qty 2

## 2015-10-01 MED ORDER — SODIUM CHLORIDE 0.9 % IJ SOLN: 3.0000 mL | Freq: Two times a day (BID) | INTRAMUSCULAR | Status: DC

## 2015-10-01 MED ORDER — MIDAZOLAM HCL 2 MG/2ML IJ SOLN
INTRAMUSCULAR | Status: AC
  Filled 2015-10-01: qty 2

## 2015-10-01 MED ORDER — HEPARIN SODIUM (PORCINE) 1000 UNIT/ML IJ SOLN
INTRAMUSCULAR | Status: DC | PRN
  Administered 2015-10-01: 6000 [IU] via INTRAVENOUS

## 2015-10-01 MED ORDER — SODIUM CHLORIDE 0.9 % WEIGHT BASED INFUSION
3.0000 mL/kg/h | INTRAVENOUS | Status: DC
  Administered 2015-10-01: 3 mL/kg/h via INTRAVENOUS

## 2015-10-01 MED ORDER — ASPIRIN 81 MG PO CHEW
CHEWABLE_TABLET | ORAL | Status: AC
  Administered 2015-10-01: 81 mg via ORAL
  Filled 2015-10-01: qty 1

## 2015-10-01 MED ORDER — SODIUM CHLORIDE 0.9 % WEIGHT BASED INFUSION: 1.0000 mL/kg/h | INTRAVENOUS | Status: DC

## 2015-10-01 MED ORDER — VERAPAMIL HCL 2.5 MG/ML IV SOLN
INTRA_ARTERIAL | Status: DC | PRN
  Administered 2015-10-01: 7 mL via INTRA_ARTERIAL

## 2015-10-01 MED ORDER — HYDROMORPHONE HCL 1 MG/ML IJ SOLN
INTRAMUSCULAR | Status: AC
  Filled 2015-10-01: qty 1

## 2015-10-01 MED ORDER — HEPARIN (PORCINE) IN NACL 2-0.9 UNIT/ML-% IJ SOLN
INTRAMUSCULAR | Status: AC
  Filled 2015-10-01: qty 1000

## 2015-10-01 MED ORDER — BIVALIRUDIN 250 MG IV SOLR
INTRAVENOUS | Status: AC
  Filled 2015-10-01: qty 250

## 2015-10-01 SURGICAL SUPPLY — 9 items
CATH INFINITI 5 FR JL3.5 (CATHETERS) ×1 IMPLANT
CATH OPTITORQUE TIG 4.0 5F (CATHETERS) ×2 IMPLANT
DEVICE RAD COMP TR BAND LRG (VASCULAR PRODUCTS) ×2 IMPLANT
GLIDESHEATH SLEND A-KIT 6F 20G (SHEATH) ×2 IMPLANT
KIT HEART LEFT (KITS) ×2 IMPLANT
PACK CARDIAC CATHETERIZATION (CUSTOM PROCEDURE TRAY) ×2 IMPLANT
TRANSDUCER W/STOPCOCK (MISCELLANEOUS) ×2 IMPLANT
TUBING CIL FLEX 10 FLL-RA (TUBING) ×2 IMPLANT
WIRE SAFE-T 1.5MM-J .035X260CM (WIRE) ×2 IMPLANT

## 2015-10-01 NOTE — Discharge Instructions (Signed)
Radial Site Care Refer to this sheet in the next few weeks. These instructions provide you with information about caring for yourself after your procedure. Your health care provider may also give you more specific instructions. Your treatment has been planned according to current medical practices, but problems sometimes occur. Call your health care provider if you have any problems or questions after your procedure. WHAT TO EXPECT AFTER THE PROCEDURE After your procedure, it is typical to have the following:  Bruising at the radial site that usually fades within 1-2 weeks.  Blood collecting in the tissue (hematoma) that may be painful to the touch. It should usually decrease in size and tenderness within 1-2 weeks. HOME CARE INSTRUCTIONS  Take medicines only as directed by your health care provider.  You may shower 24-48 hours after the procedure or as directed by your health care provider. Remove the bandage (dressing) and gently wash the site with plain soap and water. Pat the area dry with a clean towel. Do not rub the site, because this may cause bleeding.  Do not take baths, swim, or use a hot tub until your health care provider approves.  Check your insertion site every day for redness, swelling, or drainage.  Do not apply powder or lotion to the site.  Do not flex or bend the affected arm for 24 hours or as directed by your health care provider.  Do not push or pull heavy objects with the affected arm for 24 hours or as directed by your health care provider.  Do not lift over 10 lb (4.5 kg) for 5 days after your procedure or as directed by your health care provider.  Ask your health care provider when it is okay to:  Return to work or school.  Resume usual physical activities or sports.  Resume sexual activity.  Do not drive home if you are discharged the same day as the procedure. Have someone else drive you.  You may drive 24 hours after the procedure unless otherwise  instructed by your health care provider.  Do not operate machinery or power tools for 24 hours after the procedure.  If your procedure was done as an outpatient procedure, which means that you went home the same day as your procedure, a responsible adult should be with you for the first 24 hours after you arrive home.  Keep all follow-up visits as directed by your health care provider. This is important. SEEK MEDICAL CARE IF:  You have a fever.  You have chills.  You have increased bleeding from the radial site. Hold pressure on the site and call 911. SEEK IMMEDIATE MEDICAL CARE IF:  You have unusual pain at the radial site.  You have redness, warmth, or swelling at the radial site.  You have drainage (other than a small amount of blood on the dressing) from the radial site.  The radial site is bleeding, and the bleeding does not stop after 30 minutes of holding steady pressure on the site.  Your arm or hand becomes pale, cool, tingly, or numb.   This information is not intended to replace advice given to you by your health care provider. Make sure you discuss any questions you have with your health care provider.   Document Released: 11/18/2010 Document Revised: 11/06/2014 Document Reviewed: 05/04/2014 Elsevier Interactive Patient Education 2016 [employer] Radial Site Care Refer to this sheet in the next few weeks. These instructions provide you with information about caring for yourself after your procedure. Your  health care provider may also give you more specific instructions. Your treatment has been planned according to current medical practices, but problems sometimes occur. Call your health care provider if you have any problems or questions after your procedure. WHAT TO EXPECT AFTER THE PROCEDURE After your procedure, it is typical to have the following:  Bruising at the radial site that usually fades within 1-2 weeks.  Blood collecting in the tissue (hematoma) that  may be painful to the touch. It should usually decrease in size and tenderness within 1-2 weeks. HOME CARE INSTRUCTIONS  Take medicines only as directed by your health care provider.  You may shower 24-48 hours after the procedure or as directed by your health care provider. Remove the bandage (dressing) and gently wash the site with plain soap and water. Pat the area dry with a clean towel. Do not rub the site, because this may cause bleeding.  Do not take baths, swim, or use a hot tub until your health care provider approves.  Check your insertion site every day for redness, swelling, or drainage.  Do not apply powder or lotion to the site.  Do not flex or bend the affected arm for 24 hours or as directed by your health care provider.  Do not push or pull heavy objects with the affected arm for 24 hours or as directed by your health care provider.  Do not lift over 10 lb (4.5 kg) for 5 days after your procedure or as directed by your health care provider.  Ask your health care provider when it is okay to:  Return to work or school.  Resume usual physical activities or sports.  Resume sexual activity.  Do not drive home if you are discharged the same day as the procedure. Have someone else drive you.  You may drive 24 hours after the procedure unless otherwise instructed by your health care provider.  Do not operate machinery or power tools for 24 hours after the procedure.  If your procedure was done as an outpatient procedure, which means that you went home the same day as your procedure, a responsible adult should be with you for the first 24 hours after you arrive home.  Keep all follow-up visits as directed by your health care provider. This is important. SEEK MEDICAL CARE IF:  You have a fever.  You have chills.  You have increased bleeding from the radial site. Hold pressure on the site. SEEK IMMEDIATE MEDICAL CARE IF:  You have unusual pain at the radial  site.  You have redness, warmth, or swelling at the radial site.  You have drainage (other than a small amount of blood on the dressing) from the radial site.  The radial site is bleeding, and the bleeding does not stop after 30 minutes of holding steady pressure on the site.  Your arm or hand becomes pale, cool, tingly, or numb.   This information is not intended to replace advice given to you by your health care provider. Make sure you discuss any questions you have with your health care provider.   Document Released: 11/18/2010 Document Revised: 11/06/2014 Document Reviewed: 05/04/2014 Elsevier Interactive Patient Education [employer]

## 2015-10-01 NOTE — Interval H&P Note (Signed)
History and Physical Interval Note:  10/01/2015 6:02 AM  Dorena DewAlan J Chiaramonte  has presented today for surgery, with the diagnosis of c/p  The various methods of treatment have been discussed with the patient and family. After consideration of risks, benefits and other options for treatment, the patient has consented to  Procedure(s): Left Heart Cath and Coronary Angiography (N/A) and possible PCI as a surgical intervention .  The patient's history has been reviewed, patient examined, no change in status, stable for surgery.  I have reviewed the patient's chart and labs.  Questions were answered to the patient's satisfaction.   Ischemic Symptoms? CCS II (Slight limitation of ordinary activity) Anti-ischemic Medical Therapy? Minimal Therapy (1 class of medications) Non-invasive Test Results? Intermediate-risk stress test findings: cardiac mortality 1-3%/year Prior CABG? No Previous CABG   Patient Information:   1-2V CAD, no prox LAD  U (5)  Indication: 16; Score: 5   Patient Information:   CTO of 1 vessel, no other CAD  U (4)  Indication: 26; Score: 4   Patient Information:   1V CAD with prox LAD  U (6)  Indication: 32; Score: 6   Patient Information:   2V-CAD with prox LAD  A (7)  Indication: 38; Score: 7   Patient Information:   3V-CAD without LMCA  A (7)  Indication: 44; Score: 7   Patient Information:   3V-CAD without LMCA With Abnormal LV systolic function  A (9)  Indication: 48; Score: 9   Patient Information:   LMCA-CAD  A (9)  Indication: 49; Score: 9   Patient Information:   2V-CAD with prox LAD PCI  A (7)  Indication: 62; Score: 7   Patient Information:   2V-CAD with prox LAD CABG  A (8)  Indication: 62; Score: 8   Patient Information:   3V-CAD without LMCA With Low CAD burden(i.e., 3 focal stenoses, low SYNTAX score) PCI  A (7)  Indication: 63; Score: 7   Patient Information:   3V-CAD without LMCA With Low CAD burden(i.e.,  3 focal stenoses, low SYNTAX score) CABG  A (9)  Indication: 63; Score: 9   Patient Information:   3V-CAD without LMCA E06c - Intermediate-high CAD burden (i.e., multiple diffuse lesions, presence of CTO, or high SYNTAX score) PCI  U (4)  Indication: 64; Score: 4   Patient Information:   3V-CAD without LMCA E06c - Intermediate-high CAD burden (i.e., multiple diffuse lesions, presence of CTO, or high SYNTAX score) CABG  A (9)  Indication: 64; Score: 9   Patient Information:   LMCA-CAD With Isolated LMCA stenosis  PCI  U (6)  Indication: 65; Score: 6   Patient Information:   LMCA-CAD With Isolated LMCA stenosis  CABG  A (9)  Indication: 65; Score: 9   Patient Information:   LMCA-CAD Additional CAD, low CAD burden (i.e., 1- to 2-vessel additional involvement, low SYNTAX score) PCI  U (5)  Indication: 66; Score: 5   Patient Information:   LMCA-CAD Additional CAD, low CAD burden (i.e., 1- to 2-vessel additional involvement, low SYNTAX score) CABG  A (9)  Indication: 66; Score: 9   Patient Information:   LMCA-CAD Additional CAD, intermediate-high CAD burden (i.e., 3-vessel involvement, presence of CTO, or high SYNTAX score) PCI  I (3)  Indication: 67; Score: 3   Patient Information:   LMCA-CAD Additional CAD, intermediate-high CAD burden (i.e., 3-vessel involvement, presence of CTO, or high SYNTAX score) CABG  A (9)  Indication: 67; Score: 9   Arwin Bisceglia

## 2015-10-01 NOTE — Progress Notes (Signed)
I had a very long discussion with the patient at the bedside and also had a long discussion with the patient's wife in the waiting room and described the coronary anatomy.  I have drawn a handwritten diagram of the coronary anatomy.  Given her options of getting surgical opinion and also consideration for percutaneous coronary revascularization was explained in detail.  I also explained to them that percutaneous revascularization will be incomplete as obtuse marginal one is diffusely diseased and I do not plan on revascularization of that.  Right coronary artery complexity was discussed however appears to be feasible for PCI.  LAD PCI is relatively straightforward, moderately complex circumflex disease that needs angioplasty to OM 2 and OM 3.  Need for long-term dual antiplatelet therapy was again discussed in detail.  Risk of slight increased recurrence of angina pectoris with PCI also explained to the patient's wife in detail.  They want to proceed with PCI, understanding all the risks and benefits with CABG.  I will set this up on a to basis, I will start the patient on Brilinta 90 mg by mouth twice a day along with aspirin 81 mg by mouth daily that he is already on which will be continued.  He'll continue on high-dose atorvastatin.

## 2015-10-05 ENCOUNTER — Encounter (HOSPITAL_COMMUNITY): Payer: Self-pay | Admitting: Pharmacy Technician

## 2015-10-08 ENCOUNTER — Encounter (HOSPITAL_COMMUNITY)
Admission: AD | Disposition: A | Discharge status: Home / Self Care | Payer: Self-pay | Source: Ambulatory Visit | Attending: Cardiothoracic Surgery

## 2015-10-08 ENCOUNTER — Encounter (HOSPITAL_COMMUNITY): Payer: Self-pay | Admitting: Cardiology

## 2015-10-08 ENCOUNTER — Inpatient Hospital Stay (HOSPITAL_COMMUNITY)
Admission: AD | Admit: 2015-10-08 | Discharge: 2015-10-17 | DRG: 233 | Disposition: A | Discharge status: Home / Self Care | Payer: PRIVATE HEALTH INSURANCE | Source: Ambulatory Visit | Attending: Cardiothoracic Surgery | Admitting: Cardiothoracic Surgery

## 2015-10-08 ENCOUNTER — Inpatient Hospital Stay (HOSPITAL_COMMUNITY): Payer: PRIVATE HEALTH INSURANCE

## 2015-10-08 ENCOUNTER — Encounter (HOSPITAL_COMMUNITY): Payer: Self-pay | Admitting: Anesthesiology

## 2015-10-08 ENCOUNTER — Other Ambulatory Visit: Payer: Self-pay | Admitting: *Deleted

## 2015-10-08 ENCOUNTER — Other Ambulatory Visit: Payer: Self-pay

## 2015-10-08 DIAGNOSIS — Z7982 Long term (current) use of aspirin: Secondary | ICD-10-CM | POA: Diagnosis not present

## 2015-10-08 DIAGNOSIS — D696 Thrombocytopenia, unspecified: Secondary | ICD-10-CM | POA: Diagnosis not present

## 2015-10-08 DIAGNOSIS — F17200 Nicotine dependence, unspecified, uncomplicated: Secondary | ICD-10-CM | POA: Diagnosis present

## 2015-10-08 DIAGNOSIS — I25119 Atherosclerotic heart disease of native coronary artery with unspecified angina pectoris: Secondary | ICD-10-CM

## 2015-10-08 DIAGNOSIS — E8770 Fluid overload, unspecified: Secondary | ICD-10-CM | POA: Diagnosis not present

## 2015-10-08 DIAGNOSIS — D62 Acute posthemorrhagic anemia: Secondary | ICD-10-CM | POA: Diagnosis not present

## 2015-10-08 DIAGNOSIS — I708 Atherosclerosis of other arteries: Secondary | ICD-10-CM | POA: Diagnosis present

## 2015-10-08 DIAGNOSIS — I2510 Atherosclerotic heart disease of native coronary artery without angina pectoris: Secondary | ICD-10-CM

## 2015-10-08 DIAGNOSIS — I209 Angina pectoris, unspecified: Secondary | ICD-10-CM | POA: Diagnosis present

## 2015-10-08 DIAGNOSIS — I25110 Atherosclerotic heart disease of native coronary artery with unstable angina pectoris: Secondary | ICD-10-CM | POA: Diagnosis present

## 2015-10-08 DIAGNOSIS — I25118 Atherosclerotic heart disease of native coronary artery with other forms of angina pectoris: Secondary | ICD-10-CM | POA: Diagnosis present

## 2015-10-08 DIAGNOSIS — Z8249 Family history of ischemic heart disease and other diseases of the circulatory system: Secondary | ICD-10-CM | POA: Diagnosis not present

## 2015-10-08 DIAGNOSIS — I2542 Coronary artery dissection: Secondary | ICD-10-CM | POA: Diagnosis not present

## 2015-10-08 DIAGNOSIS — E785 Hyperlipidemia, unspecified: Secondary | ICD-10-CM | POA: Diagnosis present

## 2015-10-08 DIAGNOSIS — I700 Atherosclerosis of aorta: Secondary | ICD-10-CM | POA: Diagnosis present

## 2015-10-08 DIAGNOSIS — I214 Non-ST elevation (NSTEMI) myocardial infarction: Secondary | ICD-10-CM | POA: Diagnosis not present

## 2015-10-08 DIAGNOSIS — Z96612 Presence of left artificial shoulder joint: Secondary | ICD-10-CM | POA: Diagnosis present

## 2015-10-08 DIAGNOSIS — I739 Peripheral vascular disease, unspecified: Secondary | ICD-10-CM | POA: Diagnosis present

## 2015-10-08 DIAGNOSIS — Z79899 Other long term (current) drug therapy: Secondary | ICD-10-CM | POA: Diagnosis not present

## 2015-10-08 DIAGNOSIS — I10 Essential (primary) hypertension: Secondary | ICD-10-CM | POA: Diagnosis present

## 2015-10-08 DIAGNOSIS — Z951 Presence of aortocoronary bypass graft: Secondary | ICD-10-CM

## 2015-10-08 HISTORY — PX: PERIPHERAL VASCULAR CATHETERIZATION: SHX172C

## 2015-10-08 HISTORY — PX: CARDIAC CATHETERIZATION: SHX172

## 2015-10-08 LAB — URINALYSIS, ROUTINE W REFLEX MICROSCOPIC
Bilirubin Urine: NEGATIVE
Glucose, UA: 500 mg/dL — AB
Hgb urine dipstick: NEGATIVE
Ketones, ur: NEGATIVE mg/dL
Leukocytes, UA: NEGATIVE
Nitrite: NEGATIVE
Protein, ur: NEGATIVE mg/dL
Specific Gravity, Urine: >1.046 — ABNORMAL HIGH (ref 1.005–1.030)
pH: 5.5 (ref 5.0–8.0)

## 2015-10-08 LAB — SPIROMETRY WITH GRAPH
FEF 25-75 Pre: 2.35 L/s
FEF2575-%Pred-Pre: 81 %
FEV1-%Pred-Pre: 76 %
FEV1-Pre: 2.54 L
FEV1FVC-%Pred-Pre: 92 %
FEV6-%Pred-Pre: 79 %
FEV6-Pre: 3.25 L
FEV6FVC-%Pred-Pre: 104 %
FVC-%Pred-Pre: 82 %
FVC-Pre: 3.54 L
Pre FEV1/FVC ratio: 72 %
Pre FEV6/FVC Ratio: 100 %

## 2015-10-08 LAB — CBC
HEMATOCRIT: 39.9 % (ref 39.0–52.0)
Hemoglobin: 12.8 g/dL — ABNORMAL LOW (ref 13.0–17.0)
MCH: 29.7 pg (ref 26.0–34.0)
MCHC: 32.1 g/dL (ref 30.0–36.0)
MCV: 92.6 fL (ref 78.0–100.0)
PLATELETS: 198 10*3/uL (ref 150–400)
RBC: 4.31 MIL/uL (ref 4.22–5.81)
RDW: 12.9 % (ref 11.5–15.5)
WBC: 10.2 10*3/uL (ref 4.0–10.5)

## 2015-10-08 LAB — MRSA PCR SCREENING: MRSA by PCR: NEGATIVE

## 2015-10-08 LAB — SURGICAL PCR SCREEN
MRSA, PCR: NEGATIVE
Staphylococcus aureus: NEGATIVE

## 2015-10-08 LAB — ABO/RH: ABO/RH(D): A POS

## 2015-10-08 LAB — POCT ACTIVATED CLOTTING TIME
ACTIVATED CLOTTING TIME: 183 s
Activated Clotting Time: 379 s

## 2015-10-08 LAB — HEPARIN LEVEL (UNFRACTIONATED): Heparin Unfractionated: 0.16 IU/mL — ABNORMAL LOW (ref 0.30–0.70)

## 2015-10-08 LAB — PLATELET INHIBITION P2Y12: PLATELET FUNCTION P2Y12: 4 [PRU] — AB (ref 194–418)

## 2015-10-08 SURGERY — CORONARY STENT INTERVENTION
Anesthesia: LOCAL

## 2015-10-08 MED ORDER — PLASMA-LYTE 148 IV SOLN
INTRAVENOUS | Status: DC
  Filled 2015-10-08: qty 2.5

## 2015-10-08 MED ORDER — HYDROMORPHONE HCL 1 MG/ML IJ SOLN
INTRAMUSCULAR | Status: AC
  Filled 2015-10-08: qty 1

## 2015-10-08 MED ORDER — IOHEXOL 350 MG/ML SOLN
INTRAVENOUS | Status: DC | PRN
  Administered 2015-10-08: 100 mL via INTRAVENOUS

## 2015-10-08 MED ORDER — NITROGLYCERIN 0.4 MG SL SUBL
0.4000 mg | SUBLINGUAL_TABLET | SUBLINGUAL | Status: DC | PRN
  Filled 2015-10-08: qty 1

## 2015-10-08 MED ORDER — ONDANSETRON HCL 4 MG/2ML IJ SOLN
INTRAMUSCULAR | Status: DC | PRN
  Administered 2015-10-08: 4 mg via INTRAVENOUS

## 2015-10-08 MED ORDER — BIVALIRUDIN 250 MG IV SOLR
250.0000 mg | INTRAVENOUS | Status: DC | PRN
  Administered 2015-10-08: 1.75 mg/kg/h via INTRAVENOUS

## 2015-10-08 MED ORDER — NITROGLYCERIN IN D5W 200-5 MCG/ML-% IV SOLN
2.0000 ug/min | INTRAVENOUS | Status: DC
  Filled 2015-10-08: qty 250

## 2015-10-08 MED ORDER — LORATADINE 10 MG PO TABS
10.0000 mg | ORAL_TABLET | Freq: Every day | ORAL | Status: DC
  Administered 2015-10-09 – 2015-10-16 (×7): 10 mg via ORAL
  Filled 2015-10-08 (×9): qty 1

## 2015-10-08 MED ORDER — HYDROMORPHONE HCL 1 MG/ML IJ SOLN
INTRAMUSCULAR | Status: DC | PRN
  Administered 2015-10-08 (×3): 0.5 mg via INTRAVENOUS

## 2015-10-08 MED ORDER — ATORVASTATIN CALCIUM 80 MG PO TABS
80.0000 mg | ORAL_TABLET | Freq: Every day | ORAL | Status: DC
  Administered 2015-10-08 – 2015-10-16 (×8): 80 mg via ORAL
  Filled 2015-10-08 (×9): qty 1

## 2015-10-08 MED ORDER — HEPARIN SODIUM (PORCINE) 1000 UNIT/ML IJ SOLN
INTRAMUSCULAR | Status: AC
  Filled 2015-10-08: qty 1

## 2015-10-08 MED ORDER — MAGNESIUM SULFATE 50 % IJ SOLN
40.0000 meq | INTRAMUSCULAR | Status: DC
  Filled 2015-10-08: qty 10

## 2015-10-08 MED ORDER — DIAZEPAM 5 MG PO TABS
5.0000 mg | ORAL_TABLET | ORAL | Status: DC | PRN
  Administered 2015-10-08: 5 mg via ORAL
  Filled 2015-10-08 (×2): qty 1

## 2015-10-08 MED ORDER — SODIUM CHLORIDE 0.9 % IV SOLN
INTRAVENOUS | Status: DC
  Filled 2015-10-08: qty 2.5

## 2015-10-08 MED ORDER — HYDROCHLOROTHIAZIDE 12.5 MG PO CAPS
12.5000 mg | ORAL_CAPSULE | Freq: Every day | ORAL | Status: DC
  Administered 2015-10-08 – 2015-10-11 (×4): 12.5 mg via ORAL
  Filled 2015-10-08 (×5): qty 1

## 2015-10-08 MED ORDER — VERAPAMIL HCL 2.5 MG/ML IV SOLN
INTRA_ARTERIAL | Status: DC | PRN
  Administered 2015-10-08: 15 mL via INTRA_ARTERIAL

## 2015-10-08 MED ORDER — BIVALIRUDIN 250 MG IV SOLR
INTRAVENOUS | Status: AC
  Filled 2015-10-08: qty 250

## 2015-10-08 MED ORDER — LISINOPRIL-HYDROCHLOROTHIAZIDE 20-12.5 MG PO TABS: 1.0000 | ORAL_TABLET | Freq: Every day | ORAL | Status: DC

## 2015-10-08 MED ORDER — SODIUM CHLORIDE 0.9 % IV SOLN: 250.0000 mL | INTRAVENOUS | Status: DC | PRN

## 2015-10-08 MED ORDER — MIDAZOLAM HCL 2 MG/2ML IJ SOLN
INTRAMUSCULAR | Status: DC | PRN
  Administered 2015-10-08: 2 mg via INTRAVENOUS
  Administered 2015-10-08: 1 mg via INTRAVENOUS

## 2015-10-08 MED ORDER — BUDESONIDE-FORMOTEROL FUMARATE 160-4.5 MCG/ACT IN AERO
2.0000 | INHALATION_SPRAY | Freq: Two times a day (BID) | RESPIRATORY_TRACT | Status: DC
  Filled 2015-10-08: qty 6

## 2015-10-08 MED ORDER — FENTANYL CITRATE (PF) 100 MCG/2ML IJ SOLN
INTRAMUSCULAR | Status: AC
  Filled 2015-10-08: qty 2

## 2015-10-08 MED ORDER — LIDOCAINE HCL (PF) 1 % IJ SOLN
INTRAMUSCULAR | Status: AC
  Filled 2015-10-08: qty 30

## 2015-10-08 MED ORDER — SODIUM CHLORIDE 0.9 % WEIGHT BASED INFUSION
1.0000 mL/kg/h | INTRAVENOUS | Status: DC
  Administered 2015-10-08: 500 mL via INTRAVENOUS

## 2015-10-08 MED ORDER — NITROGLYCERIN 1 MG/10 ML FOR IR/CATH LAB
INTRA_ARTERIAL | Status: DC | PRN
  Administered 2015-10-08 (×2)

## 2015-10-08 MED ORDER — BUPROPION HCL ER (SR) 150 MG PO TB12
150.0000 mg | ORAL_TABLET | Freq: Two times a day (BID) | ORAL | Status: DC
  Administered 2015-10-08 – 2015-10-16 (×15): 150 mg via ORAL
  Filled 2015-10-08 (×16): qty 1

## 2015-10-08 MED ORDER — SODIUM CHLORIDE 0.9 % IJ SOLN: 3.0000 mL | INTRAMUSCULAR | Status: DC | PRN

## 2015-10-08 MED ORDER — SODIUM CHLORIDE 0.9 % IV SOLN
INTRAVENOUS | Status: AC
  Administered 2015-10-08: 400 mL via INTRAVENOUS

## 2015-10-08 MED ORDER — METOPROLOL TARTRATE 25 MG PO TABS
25.0000 mg | ORAL_TABLET | Freq: Two times a day (BID) | ORAL | Status: DC
  Administered 2015-10-08 – 2015-10-10 (×5): 25 mg via ORAL
  Administered 2015-10-11: 12.5 mg via ORAL
  Administered 2015-10-11: 25 mg via ORAL
  Filled 2015-10-08 (×8): qty 1

## 2015-10-08 MED ORDER — MIDAZOLAM HCL 2 MG/2ML IJ SOLN
INTRAMUSCULAR | Status: AC
  Filled 2015-10-08: qty 2

## 2015-10-08 MED ORDER — PHENYLEPHRINE HCL 10 MG/ML IJ SOLN
30.0000 ug/min | INTRAVENOUS | Status: DC
  Filled 2015-10-08: qty 2

## 2015-10-08 MED ORDER — HEPARIN (PORCINE) IN NACL 2-0.9 UNIT/ML-% IJ SOLN
INTRAMUSCULAR | Status: AC
  Filled 2015-10-08: qty 2000

## 2015-10-08 MED ORDER — HEPARIN (PORCINE) IN NACL 2-0.9 UNIT/ML-% IJ SOLN
INTRAMUSCULAR | Status: DC | PRN
  Administered 2015-10-08: 09:00:00

## 2015-10-08 MED ORDER — OXYCODONE-ACETAMINOPHEN 5-325 MG PO TABS: 1.0000 | ORAL_TABLET | ORAL | Status: DC | PRN

## 2015-10-08 MED ORDER — LISINOPRIL 20 MG PO TABS
20.0000 mg | ORAL_TABLET | Freq: Every day | ORAL | Status: DC
  Administered 2015-10-08 – 2015-10-11 (×4): 20 mg via ORAL
  Filled 2015-10-08 (×4): qty 1

## 2015-10-08 MED ORDER — ASPIRIN 81 MG PO CHEW: 81.0000 mg | CHEWABLE_TABLET | ORAL | Status: DC

## 2015-10-08 MED ORDER — DEXTROSE 5 % IV SOLN
0.0000 ug/min | INTRAVENOUS | Status: DC
  Filled 2015-10-08: qty 4

## 2015-10-08 MED ORDER — ALBUTEROL SULFATE (2.5 MG/3ML) 0.083% IN NEBU: 2.5000 mg | INHALATION_SOLUTION | Freq: Four times a day (QID) | RESPIRATORY_TRACT | Status: DC | PRN

## 2015-10-08 MED ORDER — DOPAMINE-DEXTROSE 3.2-5 MG/ML-% IV SOLN
0.0000 ug/kg/min | INTRAVENOUS | Status: DC
  Filled 2015-10-08: qty 250

## 2015-10-08 MED ORDER — VANCOMYCIN HCL 10 G IV SOLR
1250.0000 mg | INTRAVENOUS | Status: DC
  Filled 2015-10-08: qty 1250

## 2015-10-08 MED ORDER — ALBUTEROL SULFATE (2.5 MG/3ML) 0.083% IN NEBU
2.5000 mg | INHALATION_SOLUTION | Freq: Once | RESPIRATORY_TRACT | Status: AC
  Administered 2015-10-08: 2.5 mg via RESPIRATORY_TRACT

## 2015-10-08 MED ORDER — ONDANSETRON HCL 4 MG/2ML IJ SOLN
4.0000 mg | Freq: Four times a day (QID) | INTRAMUSCULAR | Status: DC | PRN
  Administered 2015-10-08: 4 mg via INTRAVENOUS
  Filled 2015-10-08 (×2): qty 2

## 2015-10-08 MED ORDER — ASPIRIN EC 81 MG PO TBEC
81.0000 mg | DELAYED_RELEASE_TABLET | Freq: Every day | ORAL | Status: DC
  Administered 2015-10-09 – 2015-10-11 (×3): 81 mg via ORAL
  Filled 2015-10-08 (×4): qty 1

## 2015-10-08 MED ORDER — SODIUM CHLORIDE 0.9 % IJ SOLN: 3.0000 mL | Freq: Two times a day (BID) | INTRAMUSCULAR | Status: DC

## 2015-10-08 MED ORDER — DEXMEDETOMIDINE HCL IN NACL 400 MCG/100ML IV SOLN
0.1000 ug/kg/h | INTRAVENOUS | Status: DC
  Filled 2015-10-08: qty 100

## 2015-10-08 MED ORDER — SODIUM CHLORIDE 0.9 % WEIGHT BASED INFUSION
3.0000 mL/kg/h | INTRAVENOUS | Status: DC
  Administered 2015-10-08: 3 mL/kg/h via INTRAVENOUS

## 2015-10-08 MED ORDER — VANCOMYCIN HCL 1000 MG IV SOLR
INTRAVENOUS | Status: DC
  Filled 2015-10-08: qty 1000

## 2015-10-08 MED ORDER — DEXTROSE 5 % IV SOLN
750.0000 mg | INTRAVENOUS | Status: DC
  Filled 2015-10-08: qty 750

## 2015-10-08 MED ORDER — HEPARIN (PORCINE) IN NACL 100-0.45 UNIT/ML-% IJ SOLN
1050.0000 [IU]/h | INTRAMUSCULAR | Status: DC
  Administered 2015-10-08: 900 [IU]/h via INTRAVENOUS
  Administered 2015-10-09: 1200 [IU]/h via INTRAVENOUS
  Administered 2015-10-10 – 2015-10-11 (×2): 1050 [IU]/h via INTRAVENOUS
  Filled 2015-10-08 (×4): qty 250

## 2015-10-08 MED ORDER — SODIUM CHLORIDE 0.9 % IJ SOLN
3.0000 mL | Freq: Two times a day (BID) | INTRAMUSCULAR | Status: DC
  Administered 2015-10-08 – 2015-10-10 (×5): 3 mL via INTRAVENOUS

## 2015-10-08 MED ORDER — ACETAMINOPHEN 325 MG PO TABS
650.0000 mg | ORAL_TABLET | ORAL | Status: DC | PRN
  Administered 2015-10-09 – 2015-10-10 (×3): 650 mg via ORAL
  Filled 2015-10-08 (×3): qty 2

## 2015-10-08 MED ORDER — PANTOPRAZOLE SODIUM 40 MG PO TBEC
40.0000 mg | DELAYED_RELEASE_TABLET | Freq: Every day | ORAL | Status: DC
  Administered 2015-10-09 – 2015-10-11 (×3): 40 mg via ORAL
  Filled 2015-10-08 (×4): qty 1

## 2015-10-08 MED ORDER — SODIUM CHLORIDE 0.9 % IV SOLN
INTRAVENOUS | Status: DC
  Filled 2015-10-08: qty 30

## 2015-10-08 MED ORDER — VERAPAMIL HCL 2.5 MG/ML IV SOLN
INTRAVENOUS | Status: AC
  Filled 2015-10-08: qty 2

## 2015-10-08 MED ORDER — SODIUM CHLORIDE 0.9 % IV SOLN
INTRAVENOUS | Status: DC
  Filled 2015-10-08: qty 40

## 2015-10-08 MED ORDER — POTASSIUM CHLORIDE 2 MEQ/ML IV SOLN
80.0000 meq | INTRAVENOUS | Status: DC
  Filled 2015-10-08: qty 40

## 2015-10-08 MED ORDER — NITROGLYCERIN 1 MG/10 ML FOR IR/CATH LAB
INTRA_ARTERIAL | Status: AC
  Filled 2015-10-08: qty 10

## 2015-10-08 MED ORDER — DEXTROSE 5 % IV SOLN
1.5000 g | INTRAVENOUS | Status: DC
  Filled 2015-10-08: qty 1.5

## 2015-10-08 MED ORDER — BIVALIRUDIN BOLUS VIA INFUSION - CUPID
INTRAVENOUS | Status: DC | PRN
  Administered 2015-10-08: 53.1 mg via INTRAVENOUS

## 2015-10-08 MED ORDER — FLUTICASONE PROPIONATE 50 MCG/ACT NA SUSP: 2.0000 | Freq: Every day | NASAL | Status: DC | PRN

## 2015-10-08 SURGICAL SUPPLY — 16 items
BALLN EUPHORA RX 2.0X12 (BALLOONS) ×3
BALLOON EUPHORA RX 2.0X12 (BALLOONS) IMPLANT
CATH HEARTRAIL 6F IL4.0 (CATHETERS) ×1 IMPLANT
CATH INFINITI 5FR ANG PIGTAIL (CATHETERS) ×1 IMPLANT
CATH INFINITI JR4 5F (CATHETERS) ×1 IMPLANT
DEVICE RAD COMP TR BAND LRG (VASCULAR PRODUCTS) ×1 IMPLANT
GLIDESHEATH SLEND A-KIT 6F 20G (SHEATH) ×1 IMPLANT
KIT ENCORE 26 ADVANTAGE (KITS) ×3 IMPLANT
KIT HEART LEFT (KITS) ×3 IMPLANT
PACK CARDIAC CATHETERIZATION (CUSTOM PROCEDURE TRAY) ×3 IMPLANT
SHEATH PINNACLE 5F 10CM (SHEATH) ×1 IMPLANT
SYR MEDRAD MARK V 150ML (SYRINGE) ×1 IMPLANT
TRANSDUCER W/STOPCOCK (MISCELLANEOUS) ×3 IMPLANT
TUBING CIL FLEX 10 FLL-RA (TUBING) ×3 IMPLANT
WIRE ASAHI FIELDER XT 190CM (WIRE) ×2 IMPLANT
WIRE COUGAR XT STRL 190CM (WIRE) ×1 IMPLANT

## 2015-10-08 NOTE — Progress Notes (Signed)
CARDIAC REHAB PHASE I   Pt on bedrest post cath, hold ambulation today. Pre-op education done with pt. Reviewed IS, sternal precautions, activity progression, cardiac surgery book, and OHS guidelines. Gave pt instructions for cardiac surgery videos. Pt verbalized understanding. Pt in bed, call bell within reach. Will follow.    [id number] Joylene GrapesMonge, Suzan Manon C, RN, BSN 10/08/2015 3:23 PM

## 2015-10-08 NOTE — Interval H&P Note (Signed)
History and Physical Interval Note:  10/08/2015 6:31 AM  Shawn Chang  has presented today for surgery, with the diagnosis of CAD  The various methods of treatment have been discussed with the patient and family. After consideration of risks, benefits and other options for treatment, the patient has consented to  Procedure(s): Coronary Stent Intervention (N/A) as a surgical intervention .  The patient's history has been reviewed, patient examined, no change in status, stable for surgery.  I have reviewed the patient's chart and labs.  Questions were answered to the patient's satisfaction.   Please see AUC done previously.  Cath Lab Visit (complete for each Cath Lab visit)  Clinical Evaluation Leading to the Procedure:   ACS: No.  Non-ACS:    Anginal Classification: CCS III  Anti-ischemic medical therapy: Minimal Therapy (1 class of medications)  Non-Invasive Test Results: Intermediate-risk stress test findings: cardiac mortality 1-3%/year  Prior CABG: No previous CABG        Yates DecampGANJI, Deashia Soule

## 2015-10-08 NOTE — Progress Notes (Signed)
  Echocardiogram 2D Echocardiogram has been performed.  Shawn Chang, Shawn Chang 10/08/2015, 3:14 PM

## 2015-10-08 NOTE — Progress Notes (Signed)
ANTICOAGULATION CONSULT NOTE Pharmacy Consult for heparin Indication: chest pain/ACS  No Known Allergies  Patient Measurements: Height: 5\' 6"  (167.6 cm) Weight: 156 lb (70.761 kg) IBW/kg (Calculated) : 63.8 Heparin Dosing Weight: 70kg  Vital Signs: Temp: 97.9 F (36.6 C) (12/09 1951) Temp Source: Oral (12/09 1951) BP: 114/67 mmHg (12/09 2000) Pulse Rate: 77 (12/09 2100)  Labs:  Recent Labs  10/08/15 1550 10/08/15 2119  HGB 12.8*  --   HCT 39.9  --   PLT 198  --   HEPARINUNFRC  --  0.16*    CrCl cannot be calculated (Patient has no serum creatinine result on file.).   Medical History: Past Medical History  Diagnosis Date  . Hypertension    Assessment: 54 year old male no with previous cardiac history except hypertension referred to cardiology for abnormal ECG in November, diagnostic cath last week and now patient presents today for scheduled PCI.   Patient found to have an 99% occluded RCA which failed angioplasty repair. Patient also suffered dissection at the lesion. New orders to start IV heparin and allow ticagrelor to washout before proceeding with CABG next week.  Goal of Therapy:  Heparin level 0.3-0.7 units/ml Monitor platelets by anticoagulation protocol: Yes   Plan:  Will aim for lower end of goal for first 24 hours given post cath and dissection Start heparin infusion at 900 units/hr Check anti-Xa level in 6 hours and daily while on heparin Continue to monitor H&H and platelets  Sheppard CoilFrank Wilson PharmD., BCPS Clinical Pharmacist Pager [phone] 10/08/2015 9:49 PM     Addendum -Heparin level low  -Increase to 1050 units/hr -Monitor s/sx bleeding -Next level with AM labs    MastersDarl Householder, Lucie Friedlander M  10/08/2015 .9:49 PM

## 2015-10-08 NOTE — H&P (View-Only) (Signed)
I had a very long discussion with the patient at the bedside and also had a long discussion with the patient's wife in the waiting room and described the coronary anatomy.  I have drawn a handwritten diagram of the coronary anatomy.  Given her options of getting surgical opinion and also consideration for percutaneous coronary revascularization was explained in detail.  I also explained to them that percutaneous revascularization will be incomplete as obtuse marginal one is diffusely diseased and I do not plan on revascularization of that.  Right coronary artery complexity was discussed however appears to be feasible for PCI.  LAD PCI is relatively straightforward, moderately complex circumflex disease that needs angioplasty to OM 2 and OM 3.  Need for long-term dual antiplatelet therapy was again discussed in detail.  Risk of slight increased recurrence of angina pectoris with PCI also explained to the patient's wife in detail.  They want to proceed with PCI, understanding all the risks and benefits with CABG.  I will set this up on a to basis, I will start the patient on Brilinta 90 mg by mouth twice a day along with aspirin 81 mg by mouth daily that he is already on which will be continued.  He'll continue on high-dose atorvastatin. 

## 2015-10-08 NOTE — Progress Notes (Signed)
Utilization review completed.  

## 2015-10-08 NOTE — Consult Note (Signed)
[address]       [place] [id number]             [phone]        Jason Nestlan J [facility] Medical Record #[id number] Date of Birth: 02/09/1961  Referring: No ref. provider found Primary Care: No PCP Per Patient  Chief Complaint:   Class III progressive angina  History of Present Illness:     Patient examined,2-D echocardiogram and cardiac catheterization personally reviewed and discussed in cath lab with patient's cardiologist Dr Jacinto HalimGanji  54 year old Caucasian male smoker with strong family history of CAD has had recent symptoms of exertional chest pain and decreased exercise tolerance in early fatigue. A stress test was positive for inferior ischemia. Diagnostic catheterization was performed December 2 showing severe three-vessel coronary disease with preserved LV function. Echocardiogram performed today shows normal biventricular function. The patient was placed on Brillinta and underwent outpatient cath for planned staged PCI  During the procedure at attempted PCI of a heavily diseased mid and distal RCA there was vessel dissection in the PCI was aborted. Patient had transient chest pain but then stabilized and had normal EKG. The plan is to admit the patient, allow washout of the Brillinta and perform multivessel CABG later next week.the patient currently asymptomatic and is planning on starting a heparin drip once the groin sheath is removed.   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   Zubrod Score: At the time of surgery this patient's most appropriate activity status/level should be described as: []     0    Normal activity, no symptoms [x]     1    Restricted in physical strenuous activity but ambulatory, able to do out light work []     2    Ambulatory and capable of self care, unable to do work activities, up and about                 more than 50%  Of the  time                            []     3    Only limited self care, in bed greater than 50% of waking hours []     4    Completely disabled, no self care, confined to bed or chair []     5    Moribund  Past Medical History  Diagnosis Date  . Hypertension     Past Surgical History  Procedure Laterality Date  . Knee surgery    . Shoulder surgery    . Cardiac catheterization N/A 10/01/2015    Procedure: Left Heart Cath and Coronary Angiography;  Surgeon: Yates DecampJay Ganji, MD;  [facility];  Service: Cardiovascular;  Laterality: N/A;  . Cardiac catheterization N/A 10/08/2015    Procedure: Coronary Stent Intervention;  Surgeon: Yates DecampJay Ganji, MD;  [facility];  Service: Cardiovascular;  Laterality: N/A;  . Peripheral vascular catheterization  10/08/2015    Procedure: Abdominal Aortogram;  Surgeon: Yates DecampJay Ganji, MD;  [facility];  Service: Cardiovascular;;  . Peripheral vascular catheterization  10/08/2015    Procedure: Aortic Arch Angiography;  Surgeon: Yates DecampJay Ganji, MD;  [facility];  Service: Cardiovascular;;    History  Smoking status  . Current Every Day Smoker  .  Types: Cigarettes  Smokeless tobacco  . Not on file    History  Alcohol Use  . 0.0 oz/week  . 0 Standard drinks or equivalent per week    Social History   Social History  . Marital Status: Married    Spouse Name: N/A  . Number of Children: N/A  . Years of Education: N/A   Occupational History  . Not on file.   Social History Main Topics  . Smoking status: Current Every Day Smoker    Types: Cigarettes  . Smokeless tobacco: Not on file  . Alcohol Use: 0.0 oz/week    0 Standard drinks or equivalent per week  . Drug Use: No  . Sexual Activity: Not on file   Other Topics Concern  . Not on file   Social History Narrative    No Known Allergies  Current Facility-Administered Medications  Medication Dose Route Frequency Provider Last Rate Last Dose  . 0.9 %   sodium chloride infusion  250 mL Intravenous PRN Yates Decamp, MD      . acetaminophen (TYLENOL) tablet 650 mg  650 mg Oral Q4H PRN Yates Decamp, MD      . albuterol (PROVENTIL) (2.5 MG/3ML) 0.083% nebulizer solution 2.5 mg  2.5 mg Inhalation Q6H PRN Yates Decamp, MD      . albuterol (PROVENTIL) (2.5 MG/3ML) 0.083% nebulizer solution 2.5 mg  2.5 mg Nebulization Once Kerin Perna, MD      . Melene Muller ON 10/09/2015] aspirin EC tablet 81 mg  81 mg Oral Daily Yates Decamp, MD      . atorvastatin (LIPITOR) tablet 80 mg  80 mg Oral Daily Yates Decamp, MD   80 mg at 10/08/15 1458  . budesonide-formoterol (SYMBICORT) 160-4.5 MCG/ACT inhaler 2 puff  2 puff Inhalation BID Yates Decamp, MD      . buPROPion [facility] SR) 12 hr tablet 150 mg  150 mg Oral BID Yates Decamp, MD      . diazepam (VALIUM) tablet 5 mg  5 mg Oral Q4H PRN Yates Decamp, MD   5 mg at 10/08/15 1154  . fluticasone (FLONASE) 50 MCG/ACT nasal spray 2 spray  2 spray Each Nare Daily PRN Yates Decamp, MD      . heparin ADULT infusion 100 units/mL (25000 units/250 mL)  900 Units/hr Intravenous Continuous Earnie Larsson, RPH 9 mL/hr at 10/08/15 1500 900 Units/hr at 10/08/15 1500  . lisinopril (PRINIVIL,ZESTRIL) tablet 20 mg  20 mg Oral Daily Yates Decamp, MD   20 mg at 10/08/15 1154   And  . hydrochlorothiazide (MICROZIDE) capsule 12.5 mg  12.5 mg Oral Daily Yates Decamp, MD   12.5 mg at 10/08/15 1458  . [START ON 10/09/2015] loratadine (CLARITIN) tablet 10 mg  10 mg Oral Daily Yates Decamp, MD      . metoprolol tartrate (LOPRESSOR) tablet 25 mg  25 mg Oral BID Yates Decamp, MD      . nitroGLYCERIN (NITROSTAT) SL tablet 0.4 mg  0.4 mg Sublingual Q5 min PRN Yates Decamp, MD      . ondansetron [facility]) injection 4 mg  4 mg Intravenous Q6H PRN Yates Decamp, MD      . oxyCODONE-acetaminophen (PERCOCET/ROXICET) 5-325 MG per tablet 1-2 tablet  1-2 tablet Oral Q4H PRN Yates Decamp, MD      . Melene Muller ON 10/09/2015] pantoprazole (PROTONIX) EC tablet 40 mg  40 mg Oral Daily Yates Decamp, MD      .  sodium chloride 0.9 % injection 3 mL  3 mL Intravenous Q12H Yates Decamp, MD   3 mL at 10/08/15 1459  . sodium chloride 0.9 % injection 3 mL  3 mL Intravenous PRN Yates Decamp, MD        Prescriptions prior to admission  Medication Sig Dispense Refill Last Dose  . albuterol (PROVENTIL HFA;VENTOLIN HFA) 108 (90 BASE) MCG/ACT inhaler Inhale 2 puffs into the lungs every 6 (six) hours as needed for wheezing or shortness of breath. 1 Inhaler 0 Past Week at Unknown time  . aspirin 81 MG tablet Take 1 tablet (81 mg total) by mouth daily. Take one daily. 30 tablet 11 10/08/2015 at 0600  . atorvastatin (LIPITOR) 80 MG tablet Take 80 mg by mouth daily.   10/07/2015 at Unknown time  . buPROPion (WELLBUTRIN SR) 150 MG 12 hr tablet Take 1 tablet by mouth 2 (two) times daily.  0 10/08/2015 at Unknown time  . cetirizine (ZYRTEC) 10 MG tablet Take 10 mg by mouth daily.   10/08/2015 at Unknown time  . fluticasone (FLONASE) 50 MCG/ACT nasal spray Place 2 sprays into both nostrils daily. Take two sprays in each nostril daily. (Patient taking differently: Place 2 sprays into both nostrils daily as needed for allergies. Take two sprays in each nostril daily.) 16 g 12 not needed yet  . lisinopril-hydrochlorothiazide (PRINZIDE,ZESTORETIC) 20-12.5 MG per tablet Take 1 tablet by mouth daily. 90 tablet 3 10/07/2015 at Unknown time  . metoprolol tartrate (LOPRESSOR) 25 MG tablet Take 25 mg by mouth 2 (two) times daily.   10/08/2015 at Unknown time  . nitroGLYCERIN (NITROSTAT) 0.3 MG SL tablet Place 1 tablet (0.3 mg total) under the tongue every 5 (five) minutes as needed for chest pain. Call 911 after taking the first tab. 20 tablet 0 never  . omeprazole (PRILOSEC) 10 MG capsule Take 10 mg by mouth daily.   10/08/2015 at Unknown time  . ticagrelor (BRILINTA) 90 MG TABS tablet Take 90 mg by mouth 2 (two) times daily.   10/08/2015 at Unknown time    Family History  Problem Relation Age of Onset  . Heart disease Mother   . Heart disease  Father      Review of Systems:  Patient is right-hand dominant No previous thoracic trauma or pneumothorax Surgical history positive for left shoulder reconstruction, knee surgery, and sinus surgery. No anesthetic or surgical problems from these procedures other than some bleeding from sinus surgery.  Patient denies any significant bleeding problems from starting the Brillinta except for a mild single nosebleed     Cardiac Review of Systems: Y or N  Chest Pain [   yes ]  Resting SOB [ no ] Exertional SOB  [no  ]  Orthopnea no ]   Pedal Edema [  no ]    Palpitations [ no ] Syncope  [  no]   Presyncope [ no  ]  General Review of Systems: [Y] = yes [  ]=no Constitional: recent weight change [  ]; anorexia [  ]; fatigue [  ]; nausea [  ]; night sweats [  ]; fever [  ]; or chills [  ]                                                               Dental:  poor dentition[  ]; Last Dentist visit:one year   Eye : blurred vision [  ]; diplopia [   ]; vision changes [  ];  Amaurosis fugax[  ]; Resp: cough [  ];  wheezing[  ];  hemoptysis[  ]; shortness of breath[  ]; paroxysmal nocturnal dyspnea[  ]; dyspnea on exertion[  ]; or orthopnea[  ];  GI:  gallstones[  ], vomiting[  ];  dysphagia[  ]; melena[  ];  hematochezia [  ]; heartburn[  ];   Hx of  Colonoscopy[  ]; GU: kidney stones [  ]; hematuria[  ];   dysuria [  ];  nocturia[  ];  history of     obstruction [  ]; urinary frequency [  ]             Skin: rash, swelling[  ];, hair loss[  ];  peripheral edema[  ];  or itching[  ]; Musculosketetal: myalgias[  ];  joint swelling[  ];  joint erythema[  ];  joint pain[  ];  back pain[  ];  Heme/Lymph: bruising[  ];  bleeding[  ];  anemia[  ];  Neuro: TIA[  ];  headaches[  ];  stroke[  ];  vertigo[  ];  seizures[  ];   paresthesias[  ];  difficulty walking[  ];  Psych:depression[  ]; anxiety[  ];  Endocrine: diabetes[ no ];  thyroid dysfunction[  ];  Immunizations: Flu [  ]; Pneumococcal[   ];  Other:patient stopped smoking one month ago  Physical Exam: BP 109/75 mmHg  Pulse 82  Temp(Src) 96.3 F (35.7 C) (Axillary)  Resp 18  Ht  (1.676 m)  Wt 156 lb (70.761 kg)  BMI 25.19 kg/m2  SpO2 98%       Physical Exam  General: well-nourished middle-aged Caucasian male in the CCU no acute distress HEENT: Normocephalic pupils equal , dentition adequate Neck: Supple without JVD, adenopathy, or bruit Chest: Clear to auscultation, symmetrical breath sounds, no rhonchi, no tenderness             or deformity Cardiovascular: Regular rate and rhythm, no murmur, no gallop, peripheral pulses             palpable in all extremities Abdomen:  Soft, nontender, no palpable mass or organomegaly Extremities: Warm, well-perfused, no clubbing cyanosis edema or tenderness,              no venous stasis changes of the legs. No groin hematoma at cath site Rectal/GU: Deferred Neuro: Grossly non--focal and symmetrical throughout Skin: Clean and dry without rash or ulceration   Diagnostic Studies & Laboratory data:     Recent Radiology Findings:   No results found.   I have independently reviewed the above radiologic studies.  Recent Lab Findings: Lab Results  Component Value Date   WBC 10.4 08/30/2015   HGB 12.4* 08/30/2015   HCT 37.4* 08/30/2015   PLT 213 08/30/2015   GLUCOSE 93 09/09/2015   CHOL 105* 09/09/2015   TRIG 80 09/09/2015   HDL 31* 09/09/2015   LDLCALC 58 09/09/2015   ALT 17 09/09/2015   AST 23 09/09/2015   NA 137 09/09/2015   K 3.8 09/09/2015   CL 103 09/09/2015   CREATININE 1.10 09/09/2015   BUN 16 09/09/2015   CO2 27 09/09/2015   TSH 0.597 09/09/2015   HGBA1C 5.2 09/09/2015      Assessment / Plan:      PATIENT WOULD BENEFIT FROM MULTIVESSEL  CABG after washout of the brillinta to minimize risk of life-threatening hemorrhage.  Patient was admitted for IV heparin and we will follow serial P2 Y. 12 assay and determine when platelet function is  adequate for multivessel bypass grafting. The procedure CABG in the plan of care was discussed with patient and family and they understand and agree. The issue of the risk of serious hemorrhage during heart surgery with the current level of severe platelet inhibition was also reviewed with patient and family.      @ME1 @ 10/08/2015 3:49 PM

## 2015-10-08 NOTE — Progress Notes (Signed)
ANTICOAGULATION CONSULT NOTE - Initial Consult  Pharmacy Consult for heparin Indication: chest pain/ACS  No Known Allergies  Patient Measurements: Height: 5\' 6"  (167.6 cm) Weight: 156 lb (70.761 kg) IBW/kg (Calculated) : 63.8 Heparin Dosing Weight: 70kg  Vital Signs: Temp: 96.3 F (35.7 C) (12/09 1151) Temp Source: Axillary (12/09 1151) BP: 107/74 mmHg (12/09 1200) Pulse Rate: 67 (12/09 1115)  Labs: No results for input(s): HGB, HCT, PLT, APTT, LABPROT, INR, HEPARINUNFRC, CREATININE, CKTOTAL, CKMB, TROPONINI in the last 72 hours.  CrCl cannot be calculated (Patient has no serum creatinine result on file.).   Medical History: Past Medical History  Diagnosis Date  . Hypertension    Assessment: 54 year old male no with previous cardiac history except hypertension referred to cardiology for abnormal ECG in November, diagnostic cath last week and now patient presents today for scheduled PCI.   Patient found to have an 99% occluded RCA which failed angioplasty repair. Patient also suffered dissection at the lesion. New orders to start IV heparin and allow ticagrelor to washout before proceeding with CABG next week.  Goal of Therapy:  Heparin level 0.3-0.7 units/ml Monitor platelets by anticoagulation protocol: Yes   Plan:  Will aim for lower end of goal for first 24 hours given post cath and dissection Start heparin infusion at 900 units/hr Check anti-Xa level in 6 hours and daily while on heparin Continue to monitor H&H and platelets  Sheppard CoilFrank Wilson PharmD., BCPS Clinical Pharmacist Pager [phone] 10/08/2015 1:07 PM

## 2015-10-08 NOTE — Progress Notes (Signed)
I met with the family, explained to his wife regarding the dissection that occurred in the right coronary artery and that the vessel had spontaneously recanalized and hence no need for emergent CABG but will have to keep him in the hospital just in case he deteriorates or needs emergent CABG.  Plan is for bypass surgery sometime next week Monday or Tuesday.  I'll also obtain platelet inhibition testing.  All questions answered to the family.  I will start the patient on IV heparin after the right femoral arterial sheath.  I did not place an intra-aortic balloon pump due to the fact patient had antegrade dissection to the RCA and essentially can make dissection worse.

## 2015-10-08 NOTE — Progress Notes (Addendum)
Site area: rfa Site Prior to Removal:  Level 0 Pressure Applied For:35 min Manual:   yes Patient Status During Pull: stable  Post Pull Site:  Level 0 Post Pull Instructions Given:  yes Post Pull Pulses Present:palpable  Dressing Applied: clear  Bedrest begins @ 1100 till1500 Comments:

## 2015-10-09 ENCOUNTER — Other Ambulatory Visit (HOSPITAL_COMMUNITY): Payer: PRIVATE HEALTH INSURANCE

## 2015-10-09 ENCOUNTER — Inpatient Hospital Stay (HOSPITAL_COMMUNITY): Payer: PRIVATE HEALTH INSURANCE

## 2015-10-09 LAB — HEPARIN LEVEL (UNFRACTIONATED)
HEPARIN UNFRACTIONATED: 0.26 [IU]/mL — AB (ref 0.30–0.70)
HEPARIN UNFRACTIONATED: 0.45 [IU]/mL (ref 0.30–0.70)
HEPARIN UNFRACTIONATED: 0.94 [IU]/mL — AB (ref 0.30–0.70)
Heparin Unfractionated: 0.44 IU/mL (ref 0.30–0.70)

## 2015-10-09 LAB — PREPARE PLATELET PHERESIS: Unit division: 0

## 2015-10-09 LAB — BASIC METABOLIC PANEL
Anion gap: 6 (ref 5–15)
BUN: 15 mg/dL (ref 6–20)
CHLORIDE: 104 mmol/L (ref 101–111)
CO2: 28 mmol/L (ref 22–32)
CREATININE: 1.34 mg/dL — AB (ref 0.61–1.24)
Calcium: 9.1 mg/dL (ref 8.9–10.3)
GFR, EST NON AFRICAN AMERICAN: 59 mL/min — AB (ref >60)
Glucose, Bld: 122 mg/dL — ABNORMAL HIGH (ref 65–99)
Potassium: 3.8 mmol/L (ref 3.5–5.1)
SODIUM: 138 mmol/L (ref 135–145)

## 2015-10-09 LAB — CBC
HEMATOCRIT: 38.3 % — AB (ref 39.0–52.0)
HEMOGLOBIN: 12.6 g/dL — AB (ref 13.0–17.0)
MCH: 30.5 pg (ref 26.0–34.0)
MCHC: 32.9 g/dL (ref 30.0–36.0)
MCV: 92.7 fL (ref 78.0–100.0)
Platelets: 187 10*3/uL (ref 150–400)
RBC: 4.13 MIL/uL — ABNORMAL LOW (ref 4.22–5.81)
RDW: 13.1 % (ref 11.5–15.5)
WBC: 10.9 10*3/uL — AB (ref 4.0–10.5)

## 2015-10-09 LAB — APTT: APTT: 80 s — AB (ref 24–37)

## 2015-10-09 LAB — MAGNESIUM: Magnesium: 1.9 mg/dL (ref 1.7–2.4)

## 2015-10-09 LAB — PROTIME-INR
INR: 1.11 (ref 0.00–1.49)
PROTHROMBIN TIME: 14.4 s (ref 11.6–15.2)

## 2015-10-09 LAB — PLATELET INHIBITION P2Y12: Platelet Function  P2Y12: 44 [PRU] — ABNORMAL LOW (ref 194–418)

## 2015-10-09 NOTE — Progress Notes (Signed)
ANTICOAGULATION CONSULT NOTE - Follow Up Consult  Pharmacy Consult for heparin Indication: awaiting CABG  No Known Allergies  Patient Measurements: Height: 5\' 6"  (167.6 cm) Weight: 156 lb (70.761 kg) IBW/kg (Calculated) : 63.8  Vital Signs: Temp: 98 F (36.7 C) (12/10 0800) Temp Source: Oral (12/10 0800) BP: 101/70 mmHg (12/10 1000) Pulse Rate: 82 (12/10 0800)  Labs:  Recent Labs  10/08/15 1550 10/08/15 2119 10/09/15 0230 10/09/15 0935  HGB 12.8*  --  12.6*  --   HCT 39.9  --  38.3*  --   PLT 198  --  187  --   APTT  --   --  80*  --   LABPROT  --   --  14.4  --   INR  --   --  1.11  --   HEPARINUNFRC  --  0.16* 0.26* 0.45  CREATININE  --   --  1.34*  --     Estimated Creatinine Clearance: 56.9 mL/min (by C-G formula based on Cr of 1.34).   Assessment: 54 yo m on heparin awaiting CABG.  F/u HL this AM is therapeutic at 0.45 on 1200 units/hr. CBC good/stable. No issues per RN.  Goal of Therapy:  Heparin level 0.3-0.7 units/ml Monitor platelets by anticoagulation protocol: Yes   Plan:  Continue heparin infusion at 1200 units/hr 6-hr confirmatory HL @ 1530 Daily HL,CBC Monitor s/sx of bleeding  Adaleen Hulgan L. Roseanne RenoStewart, PharmD Clinical Pharmacy Resident Pager: [phone] 10/09/2015 11:18 AM

## 2015-10-09 NOTE — Progress Notes (Signed)
Another run of vtach 6bit/asymptomatic-Dr Hochrein made aware and ordered to add mg level w/ am labs.

## 2015-10-09 NOTE — Progress Notes (Signed)
Pt had 7 bit run vtach around 0011 denied any symptoms Dr Antoine PocheHochrein made aware.

## 2015-10-09 NOTE — Progress Notes (Addendum)
ANTICOAGULATION CONSULT NOTE - Follow Up Consult  Pharmacy Consult for Heparin  Indication: s/p cath awaiting CABG after Brilinta washout period  No Known Allergies  Patient Measurements: Height: 5\' 6"  (167.6 cm) Weight: 156 lb (70.761 kg) IBW/kg (Calculated) : 63.8   Vital Signs: Temp: 97.8 F (36.6 C) (12/10 2000) Temp Source: Oral (12/10 2000) BP: 106/73 mmHg (12/10 2200)  Labs:  Recent Labs  10/08/15 1550  10/09/15 0230 10/09/15 0935 10/09/15 1555 10/09/15 2315  HGB 12.8*  --  12.6*  --   --   --   HCT 39.9  --  38.3*  --   --   --   PLT 198  --  187  --   --   --   APTT  --   --  80*  --   --   --   LABPROT  --   --  14.4  --   --   --   INR  --   --  1.11  --   --   --   HEPARINUNFRC  --   < > 0.26* 0.45 0.94* 0.44  CREATININE  --   --  1.34*  --   --   --   < > = values in this interval not displayed.  Estimated Creatinine Clearance: 56.9 mL/min (by C-G formula based on Cr of 1.34).   Assessment: Therapeutic heparin level after rate decrease, awaiting CABG this week at P2Y12 allows  Goal of Therapy:  Heparin level 0.3-0.7 units/ml Monitor platelets by anticoagulation protocol: Yes   Plan:  -Continue heparin at 1050 units/hr -Confirmatory HL with AM labs -Daily CBC/HL -Monitor for bleeding  Shawn Chang, Shawn Chang 10/09/2015,11:40 PM

## 2015-10-09 NOTE — Progress Notes (Addendum)
Subjective:  Doing well, no chest pain or shortness of breath.  Objective:  Vital Signs in the last 24 hours: Temp:  [96.3 F (35.7 C)-98.1 F (36.7 C)] 98 F (36.7 C) (12/10 0800) Pulse Rate:  [60-87] 82 (12/10 0800) Resp:  [10-23] 18 (12/10 0800) BP: (100-173)/(62-101) 114/74 mmHg (12/10 0800) SpO2:  [97 %-100 %] 100 % (12/10 0800)  Intake/Output from previous day: 12/09 0701 - 12/10 0700 In: 2496.5 [P.O.:890; I.V.:1606.5] Out: 1700 [Urine:1700]  Physical Exam:   General appearance: alert, cooperative, appears stated age and no distress Eyes: negative findings: lids and lashes normal and wears glasses Neck: no adenopathy, no carotid bruit, no JVD, supple, symmetrical, trachea midline and thyroid not enlarged, symmetric, no tenderness/mass/nodules Neck: JVP - normal, carotids 2+= without bruits Resp: clear to auscultation bilaterally Chest wall: no tenderness Cardio: regular rate and rhythm, S1, S2 normal, no murmur, click, rub or gallop GI: soft, non-tender; bowel sounds normal; no masses,  no organomegaly Extremities: extremities normal, atraumatic, no cyanosis or edema and Right radial arterial access site and right femoral arterial access site without any complications.    Lab Results: BMP  Recent Labs  08/30/15 2216 09/09/15 1351 10/09/15 0230  NA 139 137 138  K 3.4* 3.8 3.8  CL 100* 103 104  CO2 30 27 28   GLUCOSE 106* 93 122*  BUN 17 16 15   CREATININE 1.42* 1.10 1.34*  CALCIUM 9.2 9.1 9.1  GFRNONAA 55* 76 59*  GFRAA >60 88 >60    CBC  Recent Labs Lab 10/09/15 0230  WBC 10.9*  RBC 4.13*  HGB 12.6*  HCT 38.3*  PLT 187  MCV 92.7  MCH 30.5  MCHC 32.9  RDW 13.1    HEMOGLOBIN A1C Lab Results  Component Value Date   HGBA1C 5.2 09/09/2015    Cardiac Panel (last 3 results) No results for input(s): CKTOTAL, CKMB, TROPONINI, RELINDX in the last 8760 hours.  BNP (last 3 results) No results for input(s): PROBNP in the last 8760  hours.  TSH  Recent Labs  09/09/15 1351  TSH 0.597    CHOLESTEROL  Recent Labs  09/09/15 1351  CHOL 105*    Hepatic Function Panel  Recent Labs  09/09/15 1351  PROT 6.5  ALBUMIN 4.0  AST 23  ALT 17  ALKPHOS 63  BILITOT 0.4    Imaging: Imaging results have been reviewed  Cardiac Studies: EKG 10/08/2015: Sinus rhythm with first degree AV block, inferior ST elevation acute injury pattern with reciprocal changes of ST depression in 1 and aVL.  Echocardiogram: 12 9 2016: Normal LV systolic function, no wall motion of normality, no significant valvular abnormality. No pericardial effusion.  Coronary angiogram 10/01/2015: 1. Normal LV systolic function, EF 60%. 2. Severe diffuse coronary calcification especially involving the left coronary arteries. 3. Separate ostia to the circumflex and LAD. Very large circumflex with 3 large marginals, OM1 diffusely diseased, OM 2 proximal long segment high-grade 90%, OM 3 proximal to mid high-grade 90% stenosis. 4. Mid LAD with a focal 90% stenosis. 5. Complex mid RCA just after the origin of RV branch high-grade 95% stenosis followed by ostial PDA 90% stenosis. Moderate to large sized PL branch disease-free. 6. Widely patent LIMA, RIMA and subclavian arteries bilaterally.  Assessment/Plan:  1. Severe triple-vessel coronary artery disease with preserved LV systolic function 2. Failed angioplasty attempt to the right coronary artery, leading to dissection and need for inpatient hospital admission and will need CABG, awaiting washout of Brilinta. Patient had transient  ST elevation with mild discomfort and chest, which resolved immediately at 10-15 minutes, patient stabilized, hence no need for emergent CABG. P2Y12 ordered for serial monitoring. 3. Hyperlipidemia 4. Strong family history of premature coronary artery disease 5. Tobacco use disorder, abstinent for the past 3 weeks. 6. Peripheral arterial disease, right external iliac  artery showing at least greater than 50% stenosis, no symptoms of claudication. Severe diffuse infrarenal abdominal aortic atherosclerosis and calcification.  Recommendation: Patient will be observed in the stepdown unit for 24 hours. Fortunately he has not had any complications over the past 24 hours since angiography. I suspect he will be stable to be transferred to a telemetry floor after 24 hours if he remains stable. Continue present medical therapy, continue IV heparin. Appreciate Dr. Theron Arista VanTrigt's involvement in care. I will repeat EKG.   Yates Decamp, M.D. 10/09/2015, 10:03 AM [facility] Pager: [phone] Office: [phone] If no answer: [phone]

## 2015-10-09 NOTE — Progress Notes (Signed)
ANTICOAGULATION CONSULT NOTE - Follow Up Consult  Pharmacy Consult for Heparin  Indication: s/p cath awaiting CABG after Brilinta washout period  No Known Allergies  Patient Measurements: Height: 5\' 6"  (167.6 cm) Weight: 156 lb (70.761 kg) IBW/kg (Calculated) : 63.8   Vital Signs: Temp: 97.9 F (36.6 C) (12/10 0000) Temp Source: Oral (12/10 0000) BP: 110/70 mmHg (12/10 0200) Pulse Rate: 68 (12/10 0300)  Labs:  Recent Labs  10/08/15 1550 10/08/15 2119 10/09/15 0230  HGB 12.8*  --  12.6*  HCT 39.9  --  38.3*  PLT 198  --  187  APTT  --   --  80*  LABPROT  --   --  14.4  INR  --   --  1.11  HEPARINUNFRC  --  0.16* 0.26*    CrCl cannot be calculated (Patient has no serum creatinine result on file.).   Assessment: Sub-therapeutic heparin level despite rate increase, no issues per RN, awaiting CABG  Goal of Therapy:  Heparin level 0.3-0.7 units/ml Monitor platelets by anticoagulation protocol: Yes   Plan:  -Increase heparin to 1200 units/hr -1000 HL  Dianelys Scinto 10/09/2015,3:11 AM

## 2015-10-09 NOTE — Progress Notes (Signed)
ANTICOAGULATION CONSULT NOTE - Follow Up Consult  Pharmacy Consult for heparin Indication: awaiting CABG  No Known Allergies  Patient Measurements: Height: 5\' 6"  (167.6 cm) Weight: 156 lb (70.761 kg) IBW/kg (Calculated) : 63.8  HDW: 70.8kg  Vital Signs: Temp: 98.1 F (36.7 C) (12/10 1200) Temp Source: Oral (12/10 1200) BP: 101/68 mmHg (12/10 1400) Pulse Rate: 82 (12/10 0800)  Labs:  Recent Labs  10/08/15 1550  10/09/15 0230 10/09/15 0935 10/09/15 1555  HGB 12.8*  --  12.6*  --   --   HCT 39.9  --  38.3*  --   --   PLT 198  --  187  --   --   APTT  --   --  80*  --   --   LABPROT  --   --  14.4  --   --   INR  --   --  1.11  --   --   HEPARINUNFRC  --   < > 0.26* 0.45 0.94*  CREATININE  --   --  1.34*  --   --   < > = values in this interval not displayed.  Estimated Creatinine Clearance: 56.9 mL/min (by C-G formula based on Cr of 1.34).   Assessment: 54 yo m on heparin for ACS awaiting CABG. No anticoag pta. F/u HL now supratherapeutic (0.94) after increase to 1200 units/h. CBC ok. No bleed/IV line issues per RN. Appears to have been drawn correctly. Decrease back to 1050 units/h and check 6h HL.  Goal of Therapy:  Heparin level 0.3-0.7 units/ml Monitor platelets by anticoagulation protocol: Yes   Plan:  Decrease heparin IV to 1050 units/h 6h HL, daily HL/CBC Mon s/sx bleeding  Babs BertinHaley Irelynd Zumstein, PharmD, [facility] Clinical Pharmacist Pager [phone] 10/09/2015 4:47 PM

## 2015-10-10 ENCOUNTER — Encounter (HOSPITAL_COMMUNITY): Payer: Self-pay | Admitting: *Deleted

## 2015-10-10 ENCOUNTER — Inpatient Hospital Stay (HOSPITAL_COMMUNITY): Payer: PRIVATE HEALTH INSURANCE

## 2015-10-10 DIAGNOSIS — I2510 Atherosclerotic heart disease of native coronary artery without angina pectoris: Secondary | ICD-10-CM

## 2015-10-10 LAB — BLOOD GAS, ARTERIAL
ACID-BASE EXCESS: 1.2 mmol/L (ref 0.0–2.0)
Bicarbonate: 25.1 mEq/L — ABNORMAL HIGH (ref 20.0–24.0)
DRAWN BY: 270221
FIO2: 0.21
O2 SAT: 96.6 %
PATIENT TEMPERATURE: 98.6
TCO2: 26.2 mmol/L (ref 0–100)
pCO2 arterial: 38.5 mmHg (ref 35.0–45.0)
pH, Arterial: 7.43 (ref 7.350–7.450)
pO2, Arterial: 85.2 mmHg (ref 80.0–100.0)

## 2015-10-10 LAB — CBC
HCT: 40.9 % (ref 39.0–52.0)
Hemoglobin: 13.5 g/dL (ref 13.0–17.0)
MCH: 30.4 pg (ref 26.0–34.0)
MCHC: 33 g/dL (ref 30.0–36.0)
MCV: 92.1 fL (ref 78.0–100.0)
PLATELETS: 198 10*3/uL (ref 150–400)
RBC: 4.44 MIL/uL (ref 4.22–5.81)
RDW: 13 % (ref 11.5–15.5)
WBC: 11.8 10*3/uL — AB (ref 4.0–10.5)

## 2015-10-10 LAB — PREPARE RBC (CROSSMATCH)

## 2015-10-10 LAB — PLATELET INHIBITION P2Y12: Platelet Function  P2Y12: 177 [PRU] — ABNORMAL LOW (ref 194–418)

## 2015-10-10 LAB — HEPARIN LEVEL (UNFRACTIONATED): HEPARIN UNFRACTIONATED: 0.48 [IU]/mL (ref 0.30–0.70)

## 2015-10-10 LAB — TROPONIN I: Troponin I: 7.85 ng/mL (ref <0.031)

## 2015-10-10 MED ORDER — DOPAMINE-DEXTROSE 3.2-5 MG/ML-% IV SOLN
0.0000 ug/kg/min | INTRAVENOUS | Status: DC
  Filled 2015-10-10 (×2): qty 250

## 2015-10-10 MED ORDER — VANCOMYCIN HCL 10 G IV SOLR
1250.0000 mg | INTRAVENOUS | Status: DC
  Filled 2015-10-10 (×2): qty 1250

## 2015-10-10 MED ORDER — DEXMEDETOMIDINE HCL IN NACL 400 MCG/100ML IV SOLN
0.1000 ug/kg/h | INTRAVENOUS | Status: DC
  Filled 2015-10-10 (×2): qty 100

## 2015-10-10 MED ORDER — PHENYLEPHRINE HCL 10 MG/ML IJ SOLN
30.0000 ug/min | INTRAVENOUS | Status: DC
  Filled 2015-10-10 (×3): qty 2

## 2015-10-10 MED ORDER — VANCOMYCIN HCL 10 G IV SOLR
1250.0000 mg | INTRAVENOUS | Status: DC
  Filled 2015-10-10: qty 1250

## 2015-10-10 MED ORDER — BISACODYL 5 MG PO TBEC
5.0000 mg | DELAYED_RELEASE_TABLET | Freq: Once | ORAL | Status: DC
  Filled 2015-10-10: qty 1

## 2015-10-10 MED ORDER — TEMAZEPAM 15 MG PO CAPS
15.0000 mg | ORAL_CAPSULE | Freq: Once | ORAL | Status: AC | PRN
  Administered 2015-10-10: 15 mg via ORAL
  Filled 2015-10-10: qty 1

## 2015-10-10 MED ORDER — EPINEPHRINE HCL 1 MG/ML IJ SOLN
0.0000 ug/min | INTRAVENOUS | Status: DC
  Filled 2015-10-10 (×2): qty 4

## 2015-10-10 MED ORDER — DEXTROSE 5 % IV SOLN
1.5000 g | INTRAVENOUS | Status: DC
  Filled 2015-10-10: qty 1.5

## 2015-10-10 MED ORDER — CHLORHEXIDINE GLUCONATE 4 % EX LIQD
60.0000 mL | Freq: Once | CUTANEOUS | Status: AC
  Administered 2015-10-11: 4 via TOPICAL
  Filled 2015-10-10: qty 60

## 2015-10-10 MED ORDER — METOPROLOL TARTRATE 12.5 MG HALF TABLET
12.5000 mg | ORAL_TABLET | Freq: Once | ORAL | Status: AC
  Administered 2015-10-11: 12.5 mg via ORAL
  Filled 2015-10-10: qty 1

## 2015-10-10 MED ORDER — CHLORHEXIDINE GLUCONATE 0.12 % MT SOLN
15.0000 mL | Freq: Once | OROMUCOSAL | Status: AC
  Administered 2015-10-11: 15 mL via OROMUCOSAL

## 2015-10-10 MED ORDER — SODIUM CHLORIDE 0.9 % IV SOLN
INTRAVENOUS | Status: DC
  Filled 2015-10-10 (×3): qty 2.5

## 2015-10-10 MED ORDER — NITROGLYCERIN IN D5W 200-5 MCG/ML-% IV SOLN
2.0000 ug/min | INTRAVENOUS | Status: DC
  Filled 2015-10-10 (×2): qty 250

## 2015-10-10 MED ORDER — POTASSIUM CHLORIDE 2 MEQ/ML IV SOLN
80.0000 meq | INTRAVENOUS | Status: DC
  Filled 2015-10-10 (×2): qty 40

## 2015-10-10 MED ORDER — SODIUM CHLORIDE 0.9 % IV SOLN
INTRAVENOUS | Status: DC
  Filled 2015-10-10 (×3): qty 40

## 2015-10-10 MED ORDER — CHLORHEXIDINE GLUCONATE 4 % EX LIQD
60.0000 mL | Freq: Once | CUTANEOUS | Status: AC
  Administered 2015-10-10: 4 via TOPICAL
  Filled 2015-10-10: qty 60

## 2015-10-10 MED ORDER — SODIUM CHLORIDE 0.9 % IV SOLN
INTRAVENOUS | Status: DC
  Filled 2015-10-10 (×2): qty 30

## 2015-10-10 MED ORDER — ALPRAZOLAM 0.25 MG PO TABS: 0.2500 mg | ORAL_TABLET | ORAL | Status: DC | PRN

## 2015-10-10 MED ORDER — PLASMA-LYTE 148 IV SOLN
INTRAVENOUS | Status: AC
  Filled 2015-10-10 (×2): qty 2.5

## 2015-10-10 MED ORDER — MAGNESIUM SULFATE 50 % IJ SOLN
40.0000 meq | INTRAMUSCULAR | Status: DC
  Filled 2015-10-10 (×3): qty 10

## 2015-10-10 MED ORDER — DEXTROSE 5 % IV SOLN
750.0000 mg | INTRAVENOUS | Status: DC
  Filled 2015-10-10 (×3): qty 750

## 2015-10-10 MED ORDER — DIAZEPAM 5 MG PO TABS
5.0000 mg | ORAL_TABLET | Freq: Once | ORAL | Status: AC
  Administered 2015-10-11: 5 mg via ORAL
  Filled 2015-10-10: qty 1

## 2015-10-10 MED ORDER — DEXTROSE 5 % IV SOLN
1.5000 g | INTRAVENOUS | Status: DC
  Filled 2015-10-10 (×2): qty 1.5

## 2015-10-10 NOTE — Progress Notes (Signed)
2 Days Post-Op Procedure(s) (LRB): Coronary Stent Intervention (N/A) Abdominal Aortogram Aortic Arch Angiography Subjective: Stable without chest pain Pre CABG Dopplers pending Platelet fx improvig with P2Y12 now up to 177 and rising Will plan CABG with LIMA and L radial artery and vein grafts in am- check P2Y12 in early am Objective: Vital signs in last 24 hours: Temp:  [97.8 F (36.6 C)-98.1 F (36.7 C)] 98.1 F (36.7 C) (12/11 0800) Cardiac Rhythm:  [-] Normal sinus rhythm (12/11 0431) Resp:  [10-21] 16 (12/11 0800) BP: (90-119)/(66-82) 108/66 mmHg (12/11 0800) SpO2:  [96 %-100 %] 96 % (12/11 0800)  Hemodynamic parameters for last 24 hours:  stable  Intake/Output from previous day: 12/10 0701 - 12/11 0700 In: 705 [P.O.:480; I.V.:225] Out: 550 [Urine:550] Intake/Output this shift: Total I/O In: 21 [I.V.:21] Out: -   Alert and comfortable No hematoma  Lab Results:  Recent Labs  10/09/15 0230 10/10/15 0239  WBC 10.9* 11.8*  HGB 12.6* 13.5  HCT 38.3* 40.9  PLT 187 198   BMET:  Recent Labs  10/09/15 0230  NA 138  K 3.8  CL 104  CO2 28  GLUCOSE 122*  BUN 15  CREATININE 1.34*  CALCIUM 9.1    PT/INR:  Recent Labs  10/09/15 0230  LABPROT 14.4  INR 1.11   ABG No results found for: PHART, HCO3, TCO2, ACIDBASEDEF, O2SAT CBG (last 3)  No results for input(s): GLUCAP in the last 72 hours.  Assessment/Plan: S/P Procedure(s) (LRB): Coronary Stent Intervention (N/A) Abdominal Aortogram Aortic Arch Angiography Holding Brillinta CABG with L radial artery in am Procedure d/w patient in detail- benefits and risks   LOS: 2 days    Kathlee Nationseter Van Trigt III 10/10/2015

## 2015-10-10 NOTE — Progress Notes (Signed)
Subjective:  Doing well, no chest pain or shortness of breath.  Objective:  Vital Signs in the last 24 hours: Temp:  [97.8 F (36.6 C)-98.1 F (36.7 C)] 98.1 F (36.7 C) (12/11 0800) Resp:  [10-21] 12 (12/11 1115) BP: (90-119)/(66-82) 108/66 mmHg (12/11 0800) SpO2:  [96 %-100 %] 96 % (12/11 0800) Weight:  [70 kg (154 lb 5.2 oz)] 70 kg (154 lb 5.2 oz) (12/11 1045)  Intake/Output from previous day: 12/10 0701 - 12/11 0700 In: 705 [P.O.:480; I.V.:225] Out: 550 [Urine:550]  Physical Exam: General appearance: alert, cooperative, appears stated age and no distress Eyes: negative findings: lids and lashes normal and wears glasses Neck: no adenopathy, no carotid bruit, no JVD, supple, symmetrical, trachea midline and thyroid not enlarged, symmetric, no tenderness/mass/nodules Resp: clear to auscultation bilaterally Chest wall: no tenderness Cardio: regular rate and rhythm, S1, S2 normal, no murmur, click, rub or gallop GI: soft, non-tender; bowel sounds normal; no masses,  no organomegaly Extremities: extremities normal, atraumatic, no cyanosis or edema and Right radial arterial access site and right femoral arterial access site without any complications.  Lab Results: BMP  Recent Labs  08/30/15 2216 09/09/15 1351 10/09/15 0230  NA 139 137 138  K 3.4* 3.8 3.8  CL 100* 103 104  CO2 30 27 28   GLUCOSE 106* 93 122*  BUN 17 16 15   CREATININE 1.42* 1.10 1.34*  CALCIUM 9.2 9.1 9.1  GFRNONAA 55* 76 59*  GFRAA >60 88 >60    CBC  Recent Labs Lab 10/10/15 0239  WBC 11.8*  RBC 4.44  HGB 13.5  HCT 40.9  PLT 198  MCV 92.1  MCH 30.4  MCHC 33.0  RDW 13.0    HEMOGLOBIN A1C Lab Results  Component Value Date   HGBA1C 5.2 09/09/2015    Cardiac Panel (last 3 results) No results for input(s): CKTOTAL, CKMB, TROPONINI, RELINDX in the last 8760 hours.  BNP (last 3 results) No results for input(s): PROBNP in the last 8760 hours.  TSH  Recent Labs  09/09/15 1351  TSH  0.597    CHOLESTEROL  Recent Labs  09/09/15 1351  CHOL 105*    Hepatic Function Panel  Recent Labs  09/09/15 1351  PROT 6.5  ALBUMIN 4.0  AST 23  ALT 17  ALKPHOS 63  BILITOT 0.4    Imaging: Imaging results have been reviewed  Cardiac Studies: EKG 10/08/2015: Sinus rhythm with first degree AV block, inferior ST elevation acute injury pattern with reciprocal changes of ST depression in 1 and aVL.  EKG 10/09/2015: sinus rhythm with 1st degree AV block at a rate of 67 bpm, old inferior infarct  Echocardiogram: 12 9 2016: Normal LV systolic function, no wall motion of normality, no significant valvular abnormality. No pericardial effusion.  Coronary angiogram 10/01/2015: 1. Normal LV systolic function, EF 60%. 2. Severe diffuse coronary calcification especially involving the left coronary arteries. 3. Separate ostia to the circumflex and LAD. Very large circumflex with 3 large marginals, OM1 diffusely diseased, OM 2 proximal long segment high-grade 90%, OM 3 proximal to mid high-grade 90% stenosis. 4. Mid LAD with a focal 90% stenosis. 5. Complex mid RCA just after the origin of RV branch high-grade 95% stenosis followed by ostial PDA 90% stenosis. Moderate to large sized PL branch disease-free. 6. Widely patent LIMA, RIMA and subclavian arteries bilaterally.  Assessment/Plan:  1. Severe triple-vessel coronary artery disease with preserved LV systolic function 2. Failed angioplasty attempt to the right coronary artery, leading to dissection and  need for inpatient hospital admission and will need CABG, awaiting washout of Brilinta. Patient had transient ST elevation with mild discomfort and chest, which resolved immediately at 10-15 minutes, patient stabilized, hence no need for emergent CABG. P2Y12 ordered for serial monitoring. 3. Hyperlipidemia 4. Strong family history of premature coronary artery disease 5. Tobacco use disorder, abstinent for the past 3 weeks. 6.  Peripheral arterial disease, right external iliac artery showing at least greater than 50% stenosis, no symptoms of claudication. Severe diffuse infrarenal abdominal aortic atherosclerosis and calcification.  Recommendation: Remains asymptomatic. Continue present medical therapy. Pt is scheduled for CABG first case tomorrow morning.  Will keep in 2H to monitor.   Erling Conte, NP-C 10/10/2015, 11:33 AM [facility] Pager: [phone] Office: [phone]

## 2015-10-10 NOTE — Progress Notes (Signed)
Pre-op Cardiac Surgery  Carotid Findings:  1-39% ICA plaquing.  Vertebral artery flow is antegrade.   Upper Extremity Right Left  Brachial Pressures 118T 123T  Radial Waveforms T T  Ulnar Waveforms T T  Palmar Arch (Allen's Test) Doppler signal obliterated with both radial and ulnar compression Doppler signal remains normal with radial compression and obliterates with ulnar compression.   Findings:      Lower  Extremity Right Left  Dorsalis Pedis T T  Anterior Tibial    Posterior Tibial T T  Ankle/Brachial Indices      Findings:  Waveforms normal

## 2015-10-10 NOTE — Progress Notes (Signed)
ANTICOAGULATION CONSULT NOTE - Follow Up Consult  Pharmacy Consult for heparin Indication: awaiting CABG  No Known Allergies  Patient Measurements: Height: 5\' 6"  (167.6 cm) Weight: 154 lb 5.2 oz (70 kg) IBW/kg (Calculated) : 63.8  Vital Signs: Temp: 98.1 F (36.7 C) (12/11 0800) Temp Source: Oral (12/11 0800) BP: 108/66 mmHg (12/11 0800)  Labs:  Recent Labs  10/08/15 1550  10/09/15 0230  10/09/15 1555 10/09/15 2315 10/10/15 0239 10/10/15 0339  HGB 12.8*  --  12.6*  --   --   --  13.5  --   HCT 39.9  --  38.3*  --   --   --  40.9  --   PLT 198  --  187  --   --   --  198  --   APTT  --   --  80*  --   --   --   --   --   LABPROT  --   --  14.4  --   --   --   --   --   INR  --   --  1.11  --   --   --   --   --   HEPARINUNFRC  --   < > 0.26*  < > 0.94* 0.44  --  0.48  CREATININE  --   --  1.34*  --   --   --   --   --   < > = values in this interval not displayed.  Estimated Creatinine Clearance: 56.9 mL/min (by C-G formula based on Cr of 1.34).   Assessment: 54 yo m on heparin awaiting CABG that is scheduled for tomorrow AM. HL has been therapeutic x 2 (0.44, 0.48) on 1050 units/hr. CBC stable.   Goal of Therapy:  Heparin level 0.3-0.7 units/ml Monitor platelets by anticoagulation protocol: Yes   Plan:  Continue heparin infusion at 1050 units/hr Daily HL, CBC (although will get d/c'ed tonight with surgery tomorrow) CABG tomorrow morning - heart pack ordered Monitor s/sx of bleeding  Marguerette Sheller L. Roseanne RenoStewart, PharmD Clinical Pharmacy Resident Pager: [phone] 10/10/2015 11:37 AM

## 2015-10-11 ENCOUNTER — Inpatient Hospital Stay (HOSPITAL_COMMUNITY): Payer: PRIVATE HEALTH INSURANCE

## 2015-10-11 ENCOUNTER — Encounter (HOSPITAL_COMMUNITY)
Admission: AD | Disposition: A | Discharge status: Home / Self Care | Payer: PRIVATE HEALTH INSURANCE | Source: Ambulatory Visit | Attending: Cardiothoracic Surgery

## 2015-10-11 LAB — HEMOGLOBIN A1C
Hgb A1c MFr Bld: 5.4 % (ref 4.8–5.6)
Mean Plasma Glucose: 108 mg/dL

## 2015-10-11 LAB — CBC
HCT: 41.7 % (ref 39.0–52.0)
HEMOGLOBIN: 13.6 g/dL (ref 13.0–17.0)
MCH: 30 pg (ref 26.0–34.0)
MCHC: 32.6 g/dL (ref 30.0–36.0)
MCV: 91.9 fL (ref 78.0–100.0)
PLATELETS: 205 10*3/uL (ref 150–400)
RBC: 4.54 MIL/uL (ref 4.22–5.81)
RDW: 12.9 % (ref 11.5–15.5)
WBC: 10.5 10*3/uL (ref 4.0–10.5)

## 2015-10-11 LAB — COMPREHENSIVE METABOLIC PANEL
ALK PHOS: 70 U/L (ref 38–126)
ALT: 31 U/L (ref 17–63)
AST: 41 U/L (ref 15–41)
Albumin: 3.4 g/dL — ABNORMAL LOW (ref 3.5–5.0)
Anion gap: 10 (ref 5–15)
BILIRUBIN TOTAL: 0.5 mg/dL (ref 0.3–1.2)
BUN: 18 mg/dL (ref 6–20)
CALCIUM: 9.4 mg/dL (ref 8.9–10.3)
CO2: 25 mmol/L (ref 22–32)
CREATININE: 1.36 mg/dL — AB (ref 0.61–1.24)
Chloride: 102 mmol/L (ref 101–111)
GFR, EST NON AFRICAN AMERICAN: 58 mL/min — AB (ref >60)
Glucose, Bld: 99 mg/dL (ref 65–99)
Potassium: 3.5 mmol/L (ref 3.5–5.1)
Sodium: 137 mmol/L (ref 135–145)
Total Protein: 6.5 g/dL (ref 6.5–8.1)

## 2015-10-11 LAB — PLATELET INHIBITION P2Y12: PLATELET FUNCTION P2Y12: 228 [PRU] (ref 194–418)

## 2015-10-11 LAB — HEPARIN LEVEL (UNFRACTIONATED): HEPARIN UNFRACTIONATED: 0.36 [IU]/mL (ref 0.30–0.70)

## 2015-10-11 LAB — TROPONIN I
Troponin I: 5.67 ng/mL (ref <0.031)
Troponin I: 5.36 ng/mL (ref <0.031)
Troponin I: 5.02 ng/mL (ref <0.031)

## 2015-10-11 LAB — PROTIME-INR
INR: 1.13 (ref 0.00–1.49)
Prothrombin Time: 14.6 seconds (ref 11.6–15.2)

## 2015-10-11 LAB — APTT: APTT: 85 s — AB (ref 24–37)

## 2015-10-11 SURGERY — CORONARY ARTERY BYPASS GRAFTING (CABG)
Anesthesia: General | Site: Chest

## 2015-10-11 MED ORDER — VANCOMYCIN HCL 10 G IV SOLR: 1250.0000 mg | Freq: Two times a day (BID) | INTRAVENOUS | Status: DC

## 2015-10-11 MED ORDER — CHLORHEXIDINE GLUCONATE 4 % EX LIQD
60.0000 mL | Freq: Once | CUTANEOUS | Status: AC
  Administered 2015-10-11: 4 via TOPICAL
  Filled 2015-10-11: qty 30

## 2015-10-11 MED ORDER — DIAZEPAM 5 MG PO TABS
5.0000 mg | ORAL_TABLET | Freq: Once | ORAL | Status: AC
  Administered 2015-10-12: 5 mg via ORAL
  Filled 2015-10-11: qty 1

## 2015-10-11 MED ORDER — DEXTROSE 5 % IV SOLN: 1.5000 g | Freq: Once | INTRAVENOUS | Status: DC

## 2015-10-11 MED ORDER — CHLORHEXIDINE GLUCONATE 4 % EX LIQD
60.0000 mL | Freq: Once | CUTANEOUS | Status: AC
  Administered 2015-10-12: 4 via TOPICAL
  Filled 2015-10-11: qty 60

## 2015-10-11 MED ORDER — TEMAZEPAM 15 MG PO CAPS
15.0000 mg | ORAL_CAPSULE | Freq: Once | ORAL | Status: AC
Start: 2015-10-11 — End: 2015-10-11
  Administered 2015-10-11: 15 mg via ORAL
  Filled 2015-10-11: qty 1

## 2015-10-11 NOTE — Progress Notes (Signed)
Subjective:  Doing well, no chest pain or shortness of breath. Wife present at bedside. Objective:  Vital Signs in the last 24 hours: Temp:  [97.8 F (36.6 C)-98.1 F (36.7 C)] 98.1 F (36.7 C) (12/12 0800) Resp:  [12-21] 19 (12/12 0800) BP: (108-134)/(77-85) 118/79 mmHg (12/12 0800) SpO2:  [98 %-99 %] 98 % (12/12 0800) Weight:  [70 kg (154 lb 5.2 oz)] 70 kg (154 lb 5.2 oz) (12/11 1045)  Intake/Output from previous day: 12/11 0701 - 12/12 0700 In: 741.5 [P.O.:500; I.V.:241.5] Out: 250 [Urine:250]  Physical Exam: General appearance: alert, cooperative, appears stated age and no distress Eyes: negative findings: lids and lashes normal and wears glasses Neck: no adenopathy, no carotid bruit, no JVD, supple, symmetrical, trachea midline and thyroid not enlarged, symmetric, no tenderness/mass/nodules Resp: clear to auscultation bilaterally, bibasilar crackles cleared by coughing Chest wall: no tenderness Cardio: regular rate and rhythm, S1, S2 normal, no murmur, click, rub or gallop GI: soft, non-tender; bowel sounds normal; no masses,  no organomegaly Extremities: extremities normal, atraumatic, no cyanosis or edema and Right radial arterial access site and right femoral arterial access site without any complications.  Lab Results: BMP  Recent Labs  09/09/15 1351 10/09/15 0230 10/11/15 0140  NA 137 138 137  K 3.8 3.8 3.5  CL 103 104 102  CO2 GLUCOSE 93 122* 99  BUN CREATININE 1.10 1.34* 1.36*  CALCIUM 9.1 9.1 9.4  GFRNONAA 76 59* 58*  GFRAA 88 >60 >60    CBC  Recent Labs Lab 10/11/15 0140  WBC 10.5  RBC 4.54  HGB 13.6  HCT 41.7  PLT 205  MCV 91.9  MCH 30.0  MCHC 32.6  RDW 12.9    HEMOGLOBIN A1C Lab Results  Component Value Date   HGBA1C 5.2 09/09/2015    Cardiac Panel (last 3 results)  Recent Labs  10/10/15 1032  TROPONINI 7.85*    TSH  Recent Labs  09/09/15 1351  TSH 0.597    CHOLESTEROL  Recent Labs   09/09/15 1351  CHOL 105*    Hepatic Function Panel  Recent Labs  09/09/15 1351 10/11/15 0140  PROT 6.5 6.5  ALBUMIN 4.0 3.4*  AST 23 41  ALT 17 31  ALKPHOS 63 70  BILITOT 0.4 0.5    Imaging: Imaging results have been reviewed  Cardiac Studies: EKG 10/08/2015: Sinus rhythm with first degree AV block, inferior ST elevation acute injury pattern with reciprocal changes of ST depression in 1 and aVL.  EKG 10/09/2015: sinus rhythm with 1st degree AV block at a rate of 67 bpm, old inferior infarct  Echocardiogram: 12 9 2016: Normal LV systolic function, no wall motion of normality, no significant valvular abnormality. No pericardial effusion.  Coronary angiogram 10/01/2015: 1. Normal LV systolic function, EF 60%. 2. Severe diffuse coronary calcification especially involving the left coronary arteries. 3. Separate ostia to the circumflex and LAD. Very large circumflex with 3 large marginals, OM1 diffusely diseased, OM 2 proximal long segment high-grade 90%, OM 3 proximal to mid high-grade 90% stenosis. 4. Mid LAD with a focal 90% stenosis. 5. Complex mid RCA just after the origin of RV branch high-grade 95% stenosis followed by ostial PDA 90% stenosis. Moderate to large sized PL branch disease-free. 6. Widely patent LIMA, RIMA and subclavian arteries bilaterally.  Assessment/Plan:  1. Severe triple-vessel coronary artery disease with preserved LV systolic function 2. Failed angioplasty attempt to the right coronary artery, leading to dissection and need  for inpatient hospital admission and will need CABG, awaiting washout of Brilinta. Patient had transient ST elevation with mild discomfort and chest, which resolved immediately at 10-15 minutes, patient stabilized, hence no need for emergent CABG. P2Y12 ordered for serial monitoring. Periprocedural MI, suspect very mild leak in Troponin and normal LVEF, expect good recovery 3. Hyperlipidemia 4. Strong family history of premature  coronary artery disease 5. Tobacco use disorder, abstinent for the past 3 weeks. 6. Peripheral arterial disease, right external iliac artery showing at least greater than 50% stenosis, no symptoms of claudication. Severe diffuse infrarenal abdominal aortic atherosclerosis and calcification.  Recommendation: Remains asymptomatic. Continue present medical therapy. Pt is scheduled for CABG first case tomorrow morning, postponed from today.  Will keep in 2H to monitor.  All questions answered to the patient and wife at bedside. They are fine waiting until morning for CABG.  Yates DecampGANJI, Justise Ehmann, M.D, [facility]ACC  10/11/2015, 9:27 AM [facility] Pager: [phone] Office: [phone]

## 2015-10-11 NOTE — Progress Notes (Signed)
ANTICOAGULATION CONSULT NOTE - Follow Up Consult  Pharmacy Consult for heparin Indication: awaiting CABG  No Known Allergies  Patient Measurements: Height: 5\' 6"  (167.6 cm) Weight: 154 lb 5.2 oz (70 kg) IBW/kg (Calculated) : 63.8  Vital Signs: Temp: 98.1 F (36.7 C) (12/12 0800) Temp Source: Oral (12/12 0800) BP: 118/79 mmHg (12/12 0800) Pulse Rate: 83 (12/12 0927)  Labs:  Recent Labs  10/09/15 0230  10/09/15 2315 10/10/15 0239 10/10/15 0339 10/10/15 1032 10/11/15 0140 10/11/15 0718  HGB 12.6*  --   --  13.5  --   --  13.6  --   HCT 38.3*  --   --  40.9  --   --  41.7  --   PLT 187  --   --  198  --   --  205  --   APTT 80*  --   --   --   --   --  85*  --   LABPROT 14.4  --   --   --   --   --  14.6  --   INR 1.11  --   --   --   --   --  1.13  --   HEPARINUNFRC 0.26*  < > 0.44  --  0.48  --  0.36  --   CREATININE 1.34*  --   --   --   --   --  1.36*  --   TROPONINI  --   --   --   --   --  7.85*  --  5.67*  < > = values in this interval not displayed.  Estimated Creatinine Clearance: 56 mL/min (by C-G formula based on Cr of 1.36).   Assessment: 54 yo m on heparin awaiting CABG scheduled for 12/13. HL remains therapeutic at 0.36 today at current rate. CBC stable.   Goal of Therapy:  Heparin level 0.3-0.7 units/ml Monitor platelets by anticoagulation protocol: Yes   Plan:  Continue heparin infusion at 1050 units/hr Daily HL CABG scheduled 12/13 Monitor CBC, s/sx of bleeding  Sherle Poeob Vincent, PharmD Clinical Pharmacy Resident Pager: [phone] 10/11/2015 10:01 AM

## 2015-10-12 ENCOUNTER — Encounter (HOSPITAL_COMMUNITY): Admission: RE | Payer: Self-pay | Source: Ambulatory Visit

## 2015-10-12 ENCOUNTER — Encounter (HOSPITAL_COMMUNITY)
Admission: AD | Disposition: A | Discharge status: Home / Self Care | Payer: PRIVATE HEALTH INSURANCE | Source: Ambulatory Visit | Attending: Cardiothoracic Surgery

## 2015-10-12 ENCOUNTER — Encounter (HOSPITAL_COMMUNITY): Payer: Self-pay | Admitting: Certified Registered Nurse Anesthetist

## 2015-10-12 ENCOUNTER — Inpatient Hospital Stay (HOSPITAL_COMMUNITY): Payer: PRIVATE HEALTH INSURANCE | Admitting: Certified Registered"

## 2015-10-12 ENCOUNTER — Inpatient Hospital Stay (HOSPITAL_COMMUNITY)
Admission: AD | Admit: 2015-10-12 | Discharge: 2015-10-12 | Disposition: A | Discharge status: Home / Self Care | Payer: PRIVATE HEALTH INSURANCE | Source: Ambulatory Visit | Attending: Cardiothoracic Surgery | Admitting: Cardiothoracic Surgery

## 2015-10-12 ENCOUNTER — Ambulatory Visit (HOSPITAL_COMMUNITY): Admission: RE | Admit: 2015-10-12 | Payer: PRIVATE HEALTH INSURANCE | Source: Ambulatory Visit | Admitting: Cardiology

## 2015-10-12 ENCOUNTER — Inpatient Hospital Stay (HOSPITAL_COMMUNITY): Payer: PRIVATE HEALTH INSURANCE

## 2015-10-12 DIAGNOSIS — I25110 Atherosclerotic heart disease of native coronary artery with unstable angina pectoris: Secondary | ICD-10-CM

## 2015-10-12 HISTORY — PX: TEE WITHOUT CARDIOVERSION: SHX5443

## 2015-10-12 HISTORY — PX: CORONARY ARTERY BYPASS GRAFT: SHX141

## 2015-10-12 LAB — POCT I-STAT, CHEM 8
BUN: 19 mg/dL (ref 6–20)
BUN: 18 mg/dL (ref 6–20)
BUN: 16 mg/dL (ref 6–20)
BUN: 15 mg/dL (ref 6–20)
BUN: 14 mg/dL (ref 6–20)
BUN: 14 mg/dL (ref 6–20)
CALCIUM ION: 1.25 mmol/L — AB (ref 1.12–1.23)
CALCIUM ION: 1.08 mmol/L — AB (ref 1.12–1.23)
CALCIUM ION: 1.17 mmol/L (ref 1.12–1.23)
CHLORIDE: 102 mmol/L (ref 101–111)
CHLORIDE: 101 mmol/L (ref 101–111)
CHLORIDE: 103 mmol/L (ref 101–111)
CREATININE: 1 mg/dL (ref 0.61–1.24)
CREATININE: 0.9 mg/dL (ref 0.61–1.24)
CREATININE: 0.9 mg/dL (ref 0.61–1.24)
Calcium, Ion: 1.27 mmol/L — ABNORMAL HIGH (ref 1.12–1.23)
Calcium, Ion: 1.01 mmol/L — ABNORMAL LOW (ref 1.12–1.23)
Calcium, Ion: 1.15 mmol/L (ref 1.12–1.23)
Chloride: 102 mmol/L (ref 101–111)
Chloride: 98 mmol/L — ABNORMAL LOW (ref 101–111)
Chloride: 100 mmol/L — ABNORMAL LOW (ref 101–111)
Creatinine, Ser: 0.9 mg/dL (ref 0.61–1.24)
Creatinine, Ser: 0.9 mg/dL (ref 0.61–1.24)
Creatinine, Ser: 1.1 mg/dL (ref 0.61–1.24)
GLUCOSE: 100 mg/dL — AB (ref 65–99)
GLUCOSE: 99 mg/dL (ref 65–99)
GLUCOSE: 107 mg/dL — AB (ref 65–99)
Glucose, Bld: 109 mg/dL — ABNORMAL HIGH (ref 65–99)
Glucose, Bld: 113 mg/dL — ABNORMAL HIGH (ref 65–99)
Glucose, Bld: 150 mg/dL — ABNORMAL HIGH (ref 65–99)
HCT: 41 % (ref 39.0–52.0)
HCT: 38 % — ABNORMAL LOW (ref 39.0–52.0)
HCT: 29 % — ABNORMAL LOW (ref 39.0–52.0)
HCT: 27 % — ABNORMAL LOW (ref 39.0–52.0)
HEMATOCRIT: 26 % — AB (ref 39.0–52.0)
HEMATOCRIT: 26 % — AB (ref 39.0–52.0)
HEMOGLOBIN: 13.9 g/dL (ref 13.0–17.0)
HEMOGLOBIN: 8.8 g/dL — AB (ref 13.0–17.0)
HEMOGLOBIN: 8.8 g/dL — AB (ref 13.0–17.0)
Hemoglobin: 12.9 g/dL — ABNORMAL LOW (ref 13.0–17.0)
Hemoglobin: 9.9 g/dL — ABNORMAL LOW (ref 13.0–17.0)
Hemoglobin: 9.2 g/dL — ABNORMAL LOW (ref 13.0–17.0)
POTASSIUM: 3.9 mmol/L (ref 3.5–5.1)
POTASSIUM: 4.6 mmol/L (ref 3.5–5.1)
POTASSIUM: 3.9 mmol/L (ref 3.5–5.1)
Potassium: 4.3 mmol/L (ref 3.5–5.1)
Potassium: 4.5 mmol/L (ref 3.5–5.1)
Potassium: 4.9 mmol/L (ref 3.5–5.1)
SODIUM: 137 mmol/L (ref 135–145)
SODIUM: 139 mmol/L (ref 135–145)
SODIUM: 139 mmol/L (ref 135–145)
Sodium: 140 mmol/L (ref 135–145)
Sodium: 139 mmol/L (ref 135–145)
Sodium: 137 mmol/L (ref 135–145)
TCO2: 29 mmol/L (ref 0–100)
TCO2: 28 mmol/L (ref 0–100)
TCO2: 28 mmol/L (ref 0–100)
TCO2: 26 mmol/L (ref 0–100)
TCO2: 25 mmol/L (ref 0–100)
TCO2: 24 mmol/L (ref 0–100)

## 2015-10-12 LAB — CREATININE, SERUM
Creatinine, Ser: 1.2 mg/dL (ref 0.61–1.24)
GFR calc Af Amer: >60 mL/min (ref >60)
GFR calc non Af Amer: >60 mL/min (ref >60)

## 2015-10-12 LAB — GLUCOSE, CAPILLARY
GLUCOSE-CAPILLARY: 112 mg/dL — AB (ref 65–99)
Glucose-Capillary: 121 mg/dL — ABNORMAL HIGH (ref 65–99)
Glucose-Capillary: 137 mg/dL — ABNORMAL HIGH (ref 65–99)

## 2015-10-12 LAB — MAGNESIUM: Magnesium: 3.1 mg/dL — ABNORMAL HIGH (ref 1.7–2.4)

## 2015-10-12 LAB — TYPE AND SCREEN
ABO/RH(D): A POS
ABO/RH(D): A POS
ANTIBODY SCREEN: NEGATIVE
Antibody Screen: NEGATIVE
Unit division: 0
Unit division: 0

## 2015-10-12 LAB — POCT I-STAT 3, ART BLOOD GAS (G3+)
ACID-BASE DEFICIT: 1 mmol/L (ref 0.0–2.0)
ACID-BASE DEFICIT: 1 mmol/L (ref 0.0–2.0)
ACID-BASE EXCESS: 4 mmol/L — AB (ref 0.0–2.0)
ACID-BASE EXCESS: 1 mmol/L (ref 0.0–2.0)
BICARBONATE: 25.3 meq/L — AB (ref 20.0–24.0)
Bicarbonate: 28.4 mEq/L — ABNORMAL HIGH (ref 20.0–24.0)
Bicarbonate: 24.9 mEq/L — ABNORMAL HIGH (ref 20.0–24.0)
Bicarbonate: 25 mEq/L — ABNORMAL HIGH (ref 20.0–24.0)
Bicarbonate: 25 mEq/L — ABNORMAL HIGH (ref 20.0–24.0)
O2 SAT: 100 %
O2 SAT: 97 %
O2 Saturation: 100 %
O2 Saturation: 96 %
O2 Saturation: 98 %
PCO2 ART: 40 mmHg (ref 35.0–45.0)
PCO2 ART: 39.9 mmHg (ref 35.0–45.0)
PCO2 ART: 45.1 mmHg — AB (ref 35.0–45.0)
PH ART: 7.424 (ref 7.350–7.450)
PH ART: 7.408 (ref 7.350–7.450)
PH ART: 7.401 (ref 7.350–7.450)
PH ART: 7.353 (ref 7.350–7.450)
PH ART: 7.354 (ref 7.350–7.450)
PO2 ART: 249 mmHg — AB (ref 80.0–100.0)
Patient temperature: 36.7
Patient temperature: 37.1
TCO2: 30 mmol/L (ref 0–100)
TCO2: 26 mmol/L (ref 0–100)
TCO2: 26 mmol/L (ref 0–100)
TCO2: 26 mmol/L (ref 0–100)
TCO2: 26 mmol/L (ref 0–100)
pCO2 arterial: 43.3 mmHg (ref 35.0–45.0)
pCO2 arterial: 44.6 mmHg (ref 35.0–45.0)
pO2, Arterial: 401 mmHg — ABNORMAL HIGH (ref 80.0–100.0)
pO2, Arterial: 81 mmHg (ref 80.0–100.0)
pO2, Arterial: 93 mmHg (ref 80.0–100.0)
pO2, Arterial: 100 mmHg (ref 80.0–100.0)

## 2015-10-12 LAB — HEMOGLOBIN AND HEMATOCRIT, BLOOD
HCT: 27.6 % — ABNORMAL LOW (ref 39.0–52.0)
Hemoglobin: 9.1 g/dL — ABNORMAL LOW (ref 13.0–17.0)

## 2015-10-12 LAB — APTT: APTT: 36 s (ref 24–37)

## 2015-10-12 LAB — CBC
HCT: 29.1 % — ABNORMAL LOW (ref 39.0–52.0)
HCT: 27.5 % — ABNORMAL LOW (ref 39.0–52.0)
HEMOGLOBIN: 9.8 g/dL — AB (ref 13.0–17.0)
Hemoglobin: 9.1 g/dL — ABNORMAL LOW (ref 13.0–17.0)
MCH: 30.4 pg (ref 26.0–34.0)
MCH: 30.1 pg (ref 26.0–34.0)
MCHC: 33.7 g/dL (ref 30.0–36.0)
MCHC: 33.1 g/dL (ref 30.0–36.0)
MCV: 90.4 fL (ref 78.0–100.0)
MCV: 91.1 fL (ref 78.0–100.0)
PLATELETS: 114 10*3/uL — AB (ref 150–400)
Platelets: 115 10*3/uL — ABNORMAL LOW (ref 150–400)
RBC: 3.22 MIL/uL — AB (ref 4.22–5.81)
RBC: 3.02 MIL/uL — ABNORMAL LOW (ref 4.22–5.81)
RDW: 12.9 % (ref 11.5–15.5)
RDW: 12.9 % (ref 11.5–15.5)
WBC: 18.5 10*3/uL — AB (ref 4.0–10.5)
WBC: 17.5 10*3/uL — ABNORMAL HIGH (ref 4.0–10.5)

## 2015-10-12 LAB — POCT I-STAT 4, (NA,K, GLUC, HGB,HCT)
GLUCOSE: 111 mg/dL — AB (ref 65–99)
HEMATOCRIT: 31 % — AB (ref 39.0–52.0)
HEMOGLOBIN: 10.5 g/dL — AB (ref 13.0–17.0)
POTASSIUM: 3.7 mmol/L (ref 3.5–5.1)
SODIUM: 139 mmol/L (ref 135–145)

## 2015-10-12 LAB — PROTIME-INR
INR: 1.51 — AB (ref 0.00–1.49)
PROTHROMBIN TIME: 18.3 s — AB (ref 11.6–15.2)

## 2015-10-12 LAB — HEMOGLOBIN A1C
Hgb A1c MFr Bld: 5.4 % (ref 4.8–5.6)
Mean Plasma Glucose: 108 mg/dL

## 2015-10-12 LAB — PLATELET COUNT: Platelets: 129 10*3/uL — ABNORMAL LOW (ref 150–400)

## 2015-10-12 SURGERY — CORONARY ARTERY BYPASS GRAFTING (CABG)
Anesthesia: General | Site: Chest

## 2015-10-12 SURGERY — CORONARY STENT INTERVENTION
Anesthesia: LOCAL

## 2015-10-12 MED ORDER — NITROGLYCERIN IN D5W 200-5 MCG/ML-% IV SOLN: 7.0000 ug/min | INTRAVENOUS | Status: DC

## 2015-10-12 MED ORDER — DEXTROSE 5 % IV SOLN
1.5000 g | Freq: Two times a day (BID) | INTRAVENOUS | Status: AC
  Administered 2015-10-12 – 2015-10-14 (×4): 1.5 g via INTRAVENOUS
  Filled 2015-10-12 (×4): qty 1.5

## 2015-10-12 MED ORDER — INSULIN REGULAR BOLUS VIA INFUSION
0.0000 [IU] | Freq: Three times a day (TID) | INTRAVENOUS | Status: DC
  Filled 2015-10-12: qty 10

## 2015-10-12 MED ORDER — BUDESONIDE-FORMOTEROL FUMARATE 160-4.5 MCG/ACT IN AERO
2.0000 | INHALATION_SPRAY | Freq: Two times a day (BID) | RESPIRATORY_TRACT | Status: DC
  Administered 2015-10-13 – 2015-10-17 (×9): 2 via RESPIRATORY_TRACT
  Filled 2015-10-12: qty 6

## 2015-10-12 MED ORDER — VANCOMYCIN HCL IN DEXTROSE 1-5 GM/200ML-% IV SOLN
1000.0000 mg | Freq: Once | INTRAVENOUS | Status: AC
  Administered 2015-10-12: 1000 mg via INTRAVENOUS
  Filled 2015-10-12: qty 200

## 2015-10-12 MED ORDER — ACETAMINOPHEN 500 MG PO TABS
1000.0000 mg | ORAL_TABLET | Freq: Four times a day (QID) | ORAL | Status: DC
  Administered 2015-10-13 – 2015-10-17 (×13): 1000 mg via ORAL
  Filled 2015-10-12 (×15): qty 2

## 2015-10-12 MED ORDER — HEPARIN SODIUM (PORCINE) 1000 UNIT/ML IJ SOLN
INTRAMUSCULAR | Status: DC | PRN
  Administered 2015-10-12: 2 mL via INTRAVENOUS
  Administered 2015-10-12: 28 mL via INTRAVENOUS
  Administered 2015-10-12: 2 mL via INTRAVENOUS

## 2015-10-12 MED ORDER — DOCUSATE SODIUM 100 MG PO CAPS
200.0000 mg | ORAL_CAPSULE | Freq: Every day | ORAL | Status: DC
  Administered 2015-10-13 – 2015-10-16 (×4): 200 mg via ORAL
  Filled 2015-10-12 (×5): qty 2

## 2015-10-12 MED ORDER — ACETAMINOPHEN 160 MG/5ML PO SOLN: 650.0000 mg | Freq: Once | ORAL | Status: AC

## 2015-10-12 MED ORDER — SODIUM CHLORIDE 0.9 % IJ SOLN
INTRAMUSCULAR | Status: DC | PRN
  Administered 2015-10-12 (×3): 4 mL via TOPICAL

## 2015-10-12 MED ORDER — DOPAMINE-DEXTROSE 3.2-5 MG/ML-% IV SOLN: 0.0000 ug/kg/min | INTRAVENOUS | Status: DC

## 2015-10-12 MED ORDER — LACTATED RINGERS IV SOLN
INTRAVENOUS | Status: DC | PRN
Start: 2015-10-12 — End: 2015-10-12
  Administered 2015-10-12 (×2): via INTRAVENOUS

## 2015-10-12 MED ORDER — LEVALBUTEROL HCL 1.25 MG/0.5ML IN NEBU: 1.2500 mg | INHALATION_SOLUTION | Freq: Four times a day (QID) | RESPIRATORY_TRACT | Status: DC

## 2015-10-12 MED ORDER — DEXTROSE 5 % IV SOLN
1.5000 g | INTRAVENOUS | Status: DC | PRN
  Administered 2015-10-12: 1.5 g via INTRAVENOUS
  Administered 2015-10-12: .75 g via INTRAVENOUS

## 2015-10-12 MED ORDER — LACTATED RINGERS IV SOLN: INTRAVENOUS | Status: DC

## 2015-10-12 MED ORDER — SODIUM CHLORIDE 0.9 % IV SOLN: INTRAVENOUS | Status: DC

## 2015-10-12 MED ORDER — SODIUM CHLORIDE 0.9 % IV SOLN
250.0000 mL | INTRAVENOUS | Status: DC
Start: 2015-10-13 — End: 2015-10-17

## 2015-10-12 MED ORDER — SODIUM CHLORIDE 0.9 % IV SOLN
200.0000 ug | INTRAVENOUS | Status: DC | PRN
  Administered 2015-10-12: 0.2 ug/kg/h via INTRAVENOUS

## 2015-10-12 MED ORDER — SODIUM CHLORIDE 0.9 % IV SOLN
10.0000 g | INTRAVENOUS | Status: DC | PRN
  Administered 2015-10-12: 5 g/h via INTRAVENOUS

## 2015-10-12 MED ORDER — MIDAZOLAM HCL 2 MG/2ML IJ SOLN: 2.0000 mg | INTRAMUSCULAR | Status: DC | PRN

## 2015-10-12 MED ORDER — KETOROLAC TROMETHAMINE 30 MG/ML IJ SOLN
15.0000 mg | Freq: Four times a day (QID) | INTRAMUSCULAR | Status: AC
  Administered 2015-10-12 – 2015-10-13 (×4): 15 mg via INTRAVENOUS
  Filled 2015-10-12 (×3): qty 1

## 2015-10-12 MED ORDER — LACTATED RINGERS IV SOLN
INTRAVENOUS | Status: DC | PRN
  Administered 2015-10-12: 08:00:00 via INTRAVENOUS

## 2015-10-12 MED ORDER — PANTOPRAZOLE SODIUM 40 MG PO TBEC
40.0000 mg | DELAYED_RELEASE_TABLET | Freq: Every day | ORAL | Status: DC
  Administered 2015-10-14 – 2015-10-16 (×3): 40 mg via ORAL
  Filled 2015-10-12 (×4): qty 1

## 2015-10-12 MED ORDER — MAGNESIUM SULFATE 4 GM/100ML IV SOLN
4.0000 g | Freq: Once | INTRAVENOUS | Status: AC
  Administered 2015-10-12: 4 g via INTRAVENOUS
  Filled 2015-10-12: qty 100

## 2015-10-12 MED ORDER — BISACODYL 10 MG RE SUPP
10.0000 mg | Freq: Every day | RECTAL | Status: DC
  Filled 2015-10-12 (×3): qty 1

## 2015-10-12 MED ORDER — ROCURONIUM BROMIDE 100 MG/10ML IV SOLN
INTRAVENOUS | Status: DC | PRN
  Administered 2015-10-12: 80 mg via INTRAVENOUS
  Administered 2015-10-12: 40 mg via INTRAVENOUS
  Administered 2015-10-12: 20 mg via INTRAVENOUS
  Administered 2015-10-12: 40 mg via INTRAVENOUS

## 2015-10-12 MED ORDER — MILRINONE IN DEXTROSE 20 MG/100ML IV SOLN
0.2500 ug/kg/min | INTRAVENOUS | Status: DC
  Administered 2015-10-13: 0.25 ug/kg/min via INTRAVENOUS
  Filled 2015-10-12: qty 100

## 2015-10-12 MED ORDER — METOPROLOL TARTRATE 25 MG/10 ML ORAL SUSPENSION: 12.5000 mg | Freq: Two times a day (BID) | ORAL | Status: DC

## 2015-10-12 MED ORDER — DOPAMINE-DEXTROSE 3.2-5 MG/ML-% IV SOLN
INTRAVENOUS | Status: DC | PRN
  Administered 2015-10-12: 3 ug/kg/min via INTRAVENOUS

## 2015-10-12 MED ORDER — PROPOFOL 10 MG/ML IV BOLUS
INTRAVENOUS | Status: DC | PRN
  Administered 2015-10-12: 50 mg via INTRAVENOUS
  Administered 2015-10-12: 70 mg via INTRAVENOUS

## 2015-10-12 MED ORDER — BISACODYL 5 MG PO TBEC
10.0000 mg | DELAYED_RELEASE_TABLET | Freq: Every day | ORAL | Status: DC
  Administered 2015-10-13 – 2015-10-14 (×2): 10 mg via ORAL
  Filled 2015-10-12 (×5): qty 2

## 2015-10-12 MED ORDER — ROCURONIUM BROMIDE 50 MG/5ML IV SOLN
INTRAVENOUS | Status: AC
Start: 2015-10-12 — End: 2015-10-12
  Filled 2015-10-12: qty 2

## 2015-10-12 MED ORDER — SODIUM CHLORIDE 0.45 % IV SOLN: INTRAVENOUS | Status: DC | PRN

## 2015-10-12 MED ORDER — HEMOSTATIC AGENTS (NO CHARGE) OPTIME
TOPICAL | Status: DC | PRN
  Administered 2015-10-12 (×2): 1 via TOPICAL

## 2015-10-12 MED ORDER — ALBUMIN HUMAN 5 % IV SOLN
250.0000 mL | INTRAVENOUS | Status: AC | PRN
Start: 2015-10-12 — End: 2015-10-13
  Administered 2015-10-12: 250 mL via INTRAVENOUS
  Filled 2015-10-12: qty 250

## 2015-10-12 MED ORDER — DEXTROSE 5 % IV SOLN
10.0000 mg | INTRAVENOUS | Status: DC | PRN
  Administered 2015-10-12: 20 ug/min via INTRAVENOUS

## 2015-10-12 MED ORDER — ROCURONIUM BROMIDE 50 MG/5ML IV SOLN
INTRAVENOUS | Status: AC
  Filled 2015-10-12: qty 1

## 2015-10-12 MED ORDER — PHENYLEPHRINE HCL 10 MG/ML IJ SOLN
0.0000 ug/min | INTRAVENOUS | Status: DC
  Filled 2015-10-12 (×2): qty 2

## 2015-10-12 MED ORDER — ANTISEPTIC ORAL RINSE SOLUTION (CORINZ)
7.0000 mL | Freq: Four times a day (QID) | OROMUCOSAL | Status: DC
  Administered 2015-10-13 (×2): 7 mL via OROMUCOSAL

## 2015-10-12 MED ORDER — DEXMEDETOMIDINE HCL IN NACL 200 MCG/50ML IV SOLN
0.0000 ug/kg/h | INTRAVENOUS | Status: DC
  Administered 2015-10-12: 0.3 ug/kg/h via INTRAVENOUS
  Filled 2015-10-12 (×2): qty 50

## 2015-10-12 MED ORDER — METOPROLOL TARTRATE 1 MG/ML IV SOLN: 2.5000 mg | INTRAVENOUS | Status: DC | PRN

## 2015-10-12 MED ORDER — CALCIUM CHLORIDE 10 % IV SOLN
INTRAVENOUS | Status: DC | PRN
  Administered 2015-10-12: 200 mg via INTRAVENOUS
  Administered 2015-10-12: 300 mg via INTRAVENOUS

## 2015-10-12 MED ORDER — ISOSORBIDE MONONITRATE ER 30 MG PO TB24
15.0000 mg | ORAL_TABLET | Freq: Every day | ORAL | Status: DC
  Administered 2015-10-13 – 2015-10-16 (×4): 15 mg via ORAL
  Filled 2015-10-12 (×6): qty 1

## 2015-10-12 MED ORDER — SUCCINYLCHOLINE CHLORIDE 20 MG/ML IJ SOLN
INTRAMUSCULAR | Status: AC
  Filled 2015-10-12: qty 1

## 2015-10-12 MED ORDER — MORPHINE SULFATE (PF) 2 MG/ML IV SOLN
1.0000 mg | INTRAVENOUS | Status: AC | PRN
  Administered 2015-10-12: 2 mg via INTRAVENOUS
  Administered 2015-10-13: 4 mg via INTRAVENOUS
  Filled 2015-10-12: qty 2

## 2015-10-12 MED ORDER — FAMOTIDINE IN NACL 20-0.9 MG/50ML-% IV SOLN
20.0000 mg | Freq: Two times a day (BID) | INTRAVENOUS | Status: AC
  Administered 2015-10-12 – 2015-10-13 (×2): 20 mg via INTRAVENOUS
  Filled 2015-10-12 (×2): qty 50

## 2015-10-12 MED ORDER — LACTATED RINGERS IV SOLN
INTRAVENOUS | Status: DC | PRN
  Administered 2015-10-12 (×2): via INTRAVENOUS

## 2015-10-12 MED ORDER — METOPROLOL TARTRATE 12.5 MG HALF TABLET
12.5000 mg | ORAL_TABLET | Freq: Two times a day (BID) | ORAL | Status: DC
  Administered 2015-10-13: 12.5 mg via ORAL
  Filled 2015-10-12 (×2): qty 1

## 2015-10-12 MED ORDER — TRAMADOL HCL 50 MG PO TABS
50.0000 mg | ORAL_TABLET | ORAL | Status: DC | PRN
  Administered 2015-10-13 – 2015-10-16 (×7): 100 mg via ORAL
  Filled 2015-10-12 (×7): qty 2

## 2015-10-12 MED ORDER — FENTANYL CITRATE (PF) 250 MCG/5ML IJ SOLN
INTRAMUSCULAR | Status: AC
  Filled 2015-10-12: qty 5

## 2015-10-12 MED ORDER — FENTANYL CITRATE (PF) 250 MCG/5ML IJ SOLN
INTRAMUSCULAR | Status: AC
Start: 2015-10-12 — End: 2015-10-12
  Filled 2015-10-12: qty 20

## 2015-10-12 MED ORDER — FENTANYL CITRATE (PF) 100 MCG/2ML IJ SOLN
INTRAMUSCULAR | Status: AC
  Administered 2015-10-12: 50 ug via INTRAVENOUS
  Administered 2015-10-12: 100 ug via INTRAVENOUS
  Administered 2015-10-12: 250 ug via INTRAVENOUS
  Administered 2015-10-12: 200 ug via INTRAVENOUS
  Administered 2015-10-12: 250 ug via INTRAVENOUS
  Administered 2015-10-12: 150 ug via INTRAVENOUS
  Administered 2015-10-12: 250 ug via INTRAVENOUS
  Administered 2015-10-12: 50 ug via INTRAVENOUS
  Administered 2015-10-12: 250 ug via INTRAVENOUS
  Filled 2015-10-12: qty 2

## 2015-10-12 MED ORDER — ASPIRIN 81 MG PO CHEW
324.0000 mg | CHEWABLE_TABLET | Freq: Every day | ORAL | Status: DC
  Administered 2015-10-15 – 2015-10-16 (×2): 324 mg
  Filled 2015-10-12 (×3): qty 4

## 2015-10-12 MED ORDER — HEPARIN SODIUM (PORCINE) 1000 UNIT/ML IJ SOLN
INTRAMUSCULAR | Status: AC
Start: 2015-10-12 — End: 2015-10-12
  Filled 2015-10-12: qty 1

## 2015-10-12 MED ORDER — MORPHINE SULFATE (PF) 2 MG/ML IV SOLN
2.0000 mg | INTRAVENOUS | Status: DC | PRN
  Filled 2015-10-12: qty 1

## 2015-10-12 MED ORDER — MIDAZOLAM HCL 2 MG/2ML IJ SOLN
INTRAMUSCULAR | Status: AC
  Administered 2015-10-12 (×3): 1 mg via INTRAVENOUS
  Administered 2015-10-12: 8 mg via INTRAVENOUS
  Administered 2015-10-12: 1 mg via INTRAVENOUS
  Administered 2015-10-12: 2 mg via INTRAVENOUS
  Filled 2015-10-12: qty 2

## 2015-10-12 MED ORDER — ACETAMINOPHEN 160 MG/5ML PO SOLN
1000.0000 mg | Freq: Four times a day (QID) | ORAL | Status: DC
  Filled 2015-10-12: qty 40.6

## 2015-10-12 MED ORDER — VANCOMYCIN HCL 1000 MG IV SOLR
1000.0000 mg | INTRAVENOUS | Status: DC | PRN
  Administered 2015-10-12: 1250 mg via INTRAVENOUS

## 2015-10-12 MED ORDER — CHLORHEXIDINE GLUCONATE 0.12 % MT SOLN
15.0000 mL | OROMUCOSAL | Status: AC
  Administered 2015-10-12: 15 mL via OROMUCOSAL
  Filled 2015-10-12: qty 15

## 2015-10-12 MED ORDER — SODIUM CHLORIDE 0.9 % IV SOLN
INTRAVENOUS | Status: DC
  Filled 2015-10-12: qty 2.5

## 2015-10-12 MED ORDER — LACTATED RINGERS IV SOLN: 500.0000 mL | Freq: Once | INTRAVENOUS | Status: DC | PRN

## 2015-10-12 MED ORDER — SODIUM CHLORIDE 0.9 % IJ SOLN
3.0000 mL | Freq: Two times a day (BID) | INTRAMUSCULAR | Status: DC
  Administered 2015-10-13 – 2015-10-15 (×4): 3 mL via INTRAVENOUS

## 2015-10-12 MED ORDER — CHLORHEXIDINE GLUCONATE 0.12% ORAL RINSE (MEDLINE KIT)
15.0000 mL | Freq: Two times a day (BID) | OROMUCOSAL | Status: DC
  Administered 2015-10-12: 15 mL via OROMUCOSAL

## 2015-10-12 MED ORDER — PROPOFOL 10 MG/ML IV BOLUS
INTRAVENOUS | Status: AC
  Filled 2015-10-12: qty 20

## 2015-10-12 MED ORDER — PROTAMINE SULFATE 10 MG/ML IV SOLN
INTRAVENOUS | Status: AC
  Filled 2015-10-12: qty 50

## 2015-10-12 MED ORDER — ASPIRIN EC 325 MG PO TBEC
325.0000 mg | DELAYED_RELEASE_TABLET | Freq: Every day | ORAL | Status: DC
  Administered 2015-10-13 – 2015-10-14 (×2): 325 mg via ORAL
  Filled 2015-10-12 (×5): qty 1

## 2015-10-12 MED ORDER — PLASMA-LYTE 148 IV SOLN
INTRAVENOUS | Status: DC | PRN
  Administered 2015-10-12 (×2): 500 mL via INTRAVASCULAR

## 2015-10-12 MED ORDER — PAPAVERINE HCL 30 MG/ML IJ SOLN
INTRAMUSCULAR | Status: DC
  Filled 2015-10-12: qty 2.5

## 2015-10-12 MED ORDER — OXYCODONE HCL 5 MG PO TABS
5.0000 mg | ORAL_TABLET | ORAL | Status: DC | PRN
  Administered 2015-10-13: 10 mg via ORAL
  Filled 2015-10-12: qty 2

## 2015-10-12 MED ORDER — MIDAZOLAM HCL 10 MG/2ML IJ SOLN
INTRAMUSCULAR | Status: AC
  Filled 2015-10-12: qty 2

## 2015-10-12 MED ORDER — MILRINONE IN DEXTROSE 20 MG/100ML IV SOLN
0.1250 ug/kg/min | INTRAVENOUS | Status: AC
  Administered 2015-10-12: .3 ug/kg/min via INTRAVENOUS
  Filled 2015-10-12: qty 100

## 2015-10-12 MED ORDER — NITROGLYCERIN IN D5W 200-5 MCG/ML-% IV SOLN
INTRAVENOUS | Status: DC | PRN
  Administered 2015-10-12: 5 ug/min via INTRAVENOUS

## 2015-10-12 MED ORDER — 0.9 % SODIUM CHLORIDE (POUR BTL) OPTIME
TOPICAL | Status: DC | PRN
  Administered 2015-10-12: 6000 mL

## 2015-10-12 MED ORDER — POTASSIUM CHLORIDE 10 MEQ/50ML IV SOLN
10.0000 meq | INTRAVENOUS | Status: AC
Start: 2015-10-12 — End: 2015-10-12
  Administered 2015-10-12 (×3): 10 meq via INTRAVENOUS

## 2015-10-12 MED ORDER — LEVALBUTEROL HCL 1.25 MG/0.5ML IN NEBU
1.2500 mg | INHALATION_SOLUTION | Freq: Four times a day (QID) | RESPIRATORY_TRACT | Status: DC
  Administered 2015-10-13: 1.25 mg via RESPIRATORY_TRACT
  Filled 2015-10-12: qty 0.5

## 2015-10-12 MED ORDER — ONDANSETRON HCL 4 MG/2ML IJ SOLN
4.0000 mg | Freq: Four times a day (QID) | INTRAMUSCULAR | Status: DC | PRN
  Administered 2015-10-12 – 2015-10-13 (×5): 4 mg via INTRAVENOUS
  Filled 2015-10-12 (×5): qty 2

## 2015-10-12 MED ORDER — PROPOFOL 10 MG/ML IV BOLUS
INTRAVENOUS | Status: AC
Start: 2015-10-12 — End: 2015-10-12
  Filled 2015-10-12: qty 40

## 2015-10-12 MED ORDER — SODIUM CHLORIDE 0.9 % IV SOLN
250.0000 [IU] | INTRAVENOUS | Status: DC | PRN
  Administered 2015-10-12: 1 [IU]/h via INTRAVENOUS

## 2015-10-12 MED ORDER — SODIUM CHLORIDE 0.9 % IJ SOLN
3.0000 mL | INTRAMUSCULAR | Status: DC | PRN
Start: 2015-10-13 — End: 2015-10-17

## 2015-10-12 MED ORDER — PROTAMINE SULFATE 10 MG/ML IV SOLN
INTRAVENOUS | Status: DC | PRN
  Administered 2015-10-12 (×2): 50 mg via INTRAVENOUS
  Administered 2015-10-12: 20 mg via INTRAVENOUS
  Administered 2015-10-12: 50 mg via INTRAVENOUS
  Administered 2015-10-12: 30 mg via INTRAVENOUS
  Administered 2015-10-12 (×2): 50 mg via INTRAVENOUS

## 2015-10-12 MED ORDER — PHENYLEPHRINE HCL 10 MG/ML IJ SOLN
INTRAMUSCULAR | Status: DC | PRN
  Administered 2015-10-12: 40 ug via INTRAVENOUS
  Administered 2015-10-12: 80 ug via INTRAVENOUS

## 2015-10-12 MED ORDER — PHENYLEPHRINE HCL 10 MG/ML IJ SOLN: 20.0000 mg | INTRAVENOUS | Status: DC | PRN

## 2015-10-12 MED ORDER — MIDAZOLAM HCL 2 MG/2ML IJ SOLN
INTRAMUSCULAR | Status: AC
  Filled 2015-10-12: qty 2

## 2015-10-12 MED ORDER — LIDOCAINE HCL (CARDIAC) 20 MG/ML IV SOLN
INTRAVENOUS | Status: DC | PRN
  Administered 2015-10-12: 80 mg via INTRAVENOUS

## 2015-10-12 MED ORDER — ALBUMIN HUMAN 5 % IV SOLN
INTRAVENOUS | Status: DC | PRN
  Administered 2015-10-12 (×2): via INTRAVENOUS

## 2015-10-12 MED ORDER — ACETAMINOPHEN 650 MG RE SUPP
650.0000 mg | Freq: Once | RECTAL | Status: AC
  Administered 2015-10-12: 650 mg via RECTAL

## 2015-10-12 MED ORDER — LIDOCAINE HCL (CARDIAC) 20 MG/ML IV SOLN
INTRAVENOUS | Status: AC
  Filled 2015-10-12: qty 5

## 2015-10-12 MED ORDER — ALBUMIN HUMAN 5 % IV SOLN: 250.0000 mL | INTRAVENOUS | Status: DC | PRN

## 2015-10-12 MED ORDER — ROCURONIUM BROMIDE 50 MG/5ML IV SOLN
INTRAVENOUS | Status: AC
  Filled 2015-10-12: qty 2

## 2015-10-12 MED FILL — Heparin Sodium (Porcine) Inj 1000 Unit/ML: INTRAMUSCULAR | Qty: 30 | Status: AC

## 2015-10-12 MED FILL — Magnesium Sulfate Inj 50%: INTRAMUSCULAR | Qty: 10 | Status: AC

## 2015-10-12 MED FILL — Potassium Chloride Inj 2 mEq/ML: INTRAVENOUS | Qty: 40 | Status: AC

## 2015-10-12 SURGICAL SUPPLY — 118 items
ADAPTER CARDIO PERF ANTE/RETRO (ADAPTER) ×4 IMPLANT
ADPR PRFSN 84XANTGRD RTRGD (ADAPTER) ×2
BAG DECANTER FOR FLEXI CONT (MISCELLANEOUS) ×4 IMPLANT
BANDAGE ELASTIC 4 VELCRO ST LF (GAUZE/BANDAGES/DRESSINGS) ×8 IMPLANT
BANDAGE ELASTIC 6 VELCRO ST LF (GAUZE/BANDAGES/DRESSINGS) ×6 IMPLANT
BASKET HEART  (ORDER IN 25'S) (MISCELLANEOUS) ×1
BASKET HEART (ORDER IN 25'S) (MISCELLANEOUS) ×1
BASKET HEART (ORDER IN 25S) (MISCELLANEOUS) ×2 IMPLANT
BLADE STERNUM SYSTEM 6 (BLADE) ×4 IMPLANT
BLADE SURG 12 STRL SS (BLADE) ×6 IMPLANT
BLADE SURG 15 STRL LF DISP TIS (BLADE) IMPLANT
BLADE SURG 15 STRL SS (BLADE) ×4
BLADE SURG ROTATE 9660 (MISCELLANEOUS) ×2 IMPLANT
BNDG GAUZE ELAST 4 BULKY (GAUZE/BANDAGES/DRESSINGS) ×8 IMPLANT
CANISTER SUCTION 2500CC (MISCELLANEOUS) ×4 IMPLANT
CANNULA GUNDRY RCSP 15FR (MISCELLANEOUS) ×4 IMPLANT
CANNULA VESSEL 3MM 2 BLNT TIP (CANNULA) ×2 IMPLANT
CANNULA VESSEL 3MM BLUNT TIP (CANNULA) ×4 IMPLANT
CATH CPB KIT VANTRIGT (MISCELLANEOUS) ×4 IMPLANT
CATH ROBINSON RED A/P 18FR (CATHETERS) ×12 IMPLANT
CATH THORACIC 36FR RT ANG (CATHETERS) ×4 IMPLANT
CLIP FOGARTY SPRING 6M (CLIP) ×4 IMPLANT
CLIP TI MEDIUM 24 (CLIP) ×2 IMPLANT
CLIP TI WIDE RED SMALL 24 (CLIP) ×8 IMPLANT
COVER BACK TABLE 80X110 HD (DRAPES) ×2 IMPLANT
COVER SURGICAL LIGHT HANDLE (MISCELLANEOUS) ×4 IMPLANT
CRADLE DONUT ADULT HEAD (MISCELLANEOUS) ×4 IMPLANT
DRAIN CHANNEL 32F RND 10.7 FF (WOUND CARE) ×4 IMPLANT
DRAPE CARDIOVASCULAR INCISE (DRAPES) ×4
DRAPE EXTREMITY T 121X128X90 (DRAPE) ×2 IMPLANT
DRAPE PROXIMA HALF (DRAPES) ×6 IMPLANT
DRAPE SLUSH/WARMER DISC (DRAPES) ×4 IMPLANT
DRAPE SRG 135X102X78XABS (DRAPES) ×2 IMPLANT
DRSG AQUACEL AG ADV 3.5X14 (GAUZE/BANDAGES/DRESSINGS) ×4 IMPLANT
ELECT BLADE 4.0 EZ CLEAN MEGAD (MISCELLANEOUS) ×4
ELECT BLADE 6.5 EXT (BLADE) ×4 IMPLANT
ELECT CAUTERY BLADE 6.4 (BLADE) ×4 IMPLANT
ELECT REM PT RETURN 9FT ADLT (ELECTROSURGICAL) ×8
ELECTRODE BLDE 4.0 EZ CLN MEGD (MISCELLANEOUS) ×2 IMPLANT
ELECTRODE REM PT RTRN 9FT ADLT (ELECTROSURGICAL) ×4 IMPLANT
GAUZE SPONGE 4X4 12PLY STRL (GAUZE/BANDAGES/DRESSINGS) ×8 IMPLANT
GLOVE BIO SURGEON STRL SZ 6 (GLOVE) ×4 IMPLANT
GLOVE BIO SURGEON STRL SZ 6.5 (GLOVE) ×4 IMPLANT
GLOVE BIO SURGEON STRL SZ7 (GLOVE) ×4 IMPLANT
GLOVE BIO SURGEON STRL SZ7.5 (GLOVE) ×12 IMPLANT
GLOVE BIO SURGEONS STRL SZ 6.5 (GLOVE) ×4
GLOVE BIOGEL PI IND STRL 6 (GLOVE) IMPLANT
GLOVE BIOGEL PI INDICATOR 6 (GLOVE) ×2
GOWN STRL REUS W/ TWL LRG LVL3 (GOWN DISPOSABLE) ×8 IMPLANT
GOWN STRL REUS W/TWL LRG LVL3 (GOWN DISPOSABLE) ×16
HARMONIC SHEARS 14CM COAG (MISCELLANEOUS) ×2 IMPLANT
HEMOSTAT POWDER SURGIFOAM 1G (HEMOSTASIS) ×12 IMPLANT
HEMOSTAT SURGICEL 2X14 (HEMOSTASIS) ×4 IMPLANT
INSERT FOGARTY XLG (MISCELLANEOUS) IMPLANT
KIT BASIN OR (CUSTOM PROCEDURE TRAY) ×4 IMPLANT
KIT ROOM TURNOVER OR (KITS) ×4 IMPLANT
KIT SUCTION CATH 14FR (SUCTIONS) ×4 IMPLANT
KIT VASOVIEW W/TROCAR VH 2000 (KITS) ×4 IMPLANT
LEAD PACING MYOCARDI (MISCELLANEOUS) ×4 IMPLANT
LIQUID BAND (GAUZE/BANDAGES/DRESSINGS) ×2 IMPLANT
MARKER GRAFT CORONARY BYPASS (MISCELLANEOUS) ×12 IMPLANT
NS IRRIG 1000ML POUR BTL (IV SOLUTION) ×20 IMPLANT
PACK OPEN HEART (CUSTOM PROCEDURE TRAY) ×4 IMPLANT
PAD ARMBOARD 7.5X6 YLW CONV (MISCELLANEOUS) ×8 IMPLANT
PAD ELECT DEFIB RADIOL ZOLL (MISCELLANEOUS) ×4 IMPLANT
PENCIL BUTTON HOLSTER BLD 10FT (ELECTRODE) ×4 IMPLANT
PUNCH AORTIC ROTATE 4.0MM (MISCELLANEOUS) ×2 IMPLANT
PUNCH AORTIC ROTATE 4.5MM 8IN (MISCELLANEOUS) IMPLANT
PUNCH AORTIC ROTATE 5MM 8IN (MISCELLANEOUS) IMPLANT
SET CARDIOPLEGIA MPS 5001102 (MISCELLANEOUS) ×2 IMPLANT
SPONGE GAUZE 4X4 12PLY STER LF (GAUZE/BANDAGES/DRESSINGS) ×6 IMPLANT
SPONGE LAP 18X18 X RAY DECT (DISPOSABLE) ×4 IMPLANT
SPONGE LAP 4X18 X RAY DECT (DISPOSABLE) ×2 IMPLANT
STAPLER VISISTAT 35W (STAPLE) ×4 IMPLANT
SURGIFLO W/THROMBIN 8M KIT (HEMOSTASIS) ×4 IMPLANT
SUT BONE WAX W31G (SUTURE) ×4 IMPLANT
SUT ETHIBOND 2 0 SH (SUTURE) ×16
SUT ETHIBOND 2 0 SH 36X2 (SUTURE) IMPLANT
SUT MNCRL AB 4-0 PS2 18 (SUTURE) ×2 IMPLANT
SUT PROLENE 3 0 SH DA (SUTURE) IMPLANT
SUT PROLENE 3 0 SH1 36 (SUTURE) IMPLANT
SUT PROLENE 4 0 RB 1 (SUTURE) ×8
SUT PROLENE 4 0 SH DA (SUTURE) ×4 IMPLANT
SUT PROLENE 4-0 RB1 .5 CRCL 36 (SUTURE) ×2 IMPLANT
SUT PROLENE 5 0 C 1 36 (SUTURE) ×4 IMPLANT
SUT PROLENE 6 0 C 1 30 (SUTURE) ×8 IMPLANT
SUT PROLENE 6 0 CC (SUTURE) ×12 IMPLANT
SUT PROLENE 8 0 BV175 6 (SUTURE) ×10 IMPLANT
SUT PROLENE BLUE 7 0 (SUTURE) ×8 IMPLANT
SUT SILK  1 MH (SUTURE) ×6
SUT SILK 1 MH (SUTURE) IMPLANT
SUT SILK 1 TIES 10X30 (SUTURE) ×2 IMPLANT
SUT SILK 2 0 SH CR/8 (SUTURE) ×6 IMPLANT
SUT SILK 2 0 TIES 10X30 (SUTURE) ×2 IMPLANT
SUT SILK 2 0 TIES 17X18 (SUTURE) ×4
SUT SILK 2-0 18XBRD TIE BLK (SUTURE) IMPLANT
SUT SILK 3 0 SH CR/8 (SUTURE) ×2 IMPLANT
SUT SILK 4 0 TIE 10X30 (SUTURE) ×4 IMPLANT
SUT STEEL 6MS V (SUTURE) ×8 IMPLANT
SUT STEEL SZ 6 DBL 3X14 BALL (SUTURE) ×4 IMPLANT
SUT TEM PAC WIRE 2 0 SH (SUTURE) ×8 IMPLANT
SUT VIC AB 1 CTX 36 (SUTURE) ×8
SUT VIC AB 1 CTX36XBRD ANBCTR (SUTURE) ×4 IMPLANT
SUT VIC AB 2-0 CT1 27 (SUTURE) ×8
SUT VIC AB 2-0 CT1 TAPERPNT 27 (SUTURE) IMPLANT
SUT VIC AB 2-0 CTX 27 (SUTURE) ×4 IMPLANT
SUT VIC AB 3-0 X1 27 (SUTURE) ×4 IMPLANT
SUTURE E-PAK OPEN HEART (SUTURE) ×4 IMPLANT
SYSTEM SAHARA CHEST DRAIN ATS (WOUND CARE) ×4 IMPLANT
TAPE CLOTH SURG 4X10 WHT LF (GAUZE/BANDAGES/DRESSINGS) ×2 IMPLANT
TAPE PAPER 2X10 WHT MICROPORE (GAUZE/BANDAGES/DRESSINGS) ×2 IMPLANT
TAPE STRIPS DRAPE STRL (GAUZE/BANDAGES/DRESSINGS) ×2 IMPLANT
TOWEL OR 17X24 6PK STRL BLUE (TOWEL DISPOSABLE) ×8 IMPLANT
TOWEL OR 17X26 10 PK STRL BLUE (TOWEL DISPOSABLE) ×8 IMPLANT
TRAY FOLEY IC TEMP SENS 16FR (CATHETERS) ×4 IMPLANT
TUBING INSUFFLATION (TUBING) ×4 IMPLANT
UNDERPAD 30X30 INCONTINENT (UNDERPADS AND DIAPERS) ×4 IMPLANT
WATER STERILE IRR 1000ML POUR (IV SOLUTION) ×8 IMPLANT

## 2015-10-12 NOTE — Brief Op Note (Signed)
10/08/2015 - 10/12/2015  2:14 PM  PATIENT:  Shawn Chang  54 y.o. male  PRE-OPERATIVE DIAGNOSIS:  1. S/p STEMI 2. Multivessel CAD  POST-OPERATIVE DIAGNOSIS:  1. S/p STEMI 2. Multivessel CAD   PROCEDURE:  TRANSESOPHAGEAL ECHOCARDIOGRAM (TEE), MEDIAN STERNOTOMY for CORONARY ARTERY BYPASS GRAFTING (CABG) x 5 (LIMA to LAD, LEFT RADIAL ARTERY to OM 1, SVG SEQUENTIALLY to OM2 and OM3, and SVG to PDA) using left internal mammary artery and bilateral legs greater saphenous vein harvested endoscopically and open harvest left radial artery.  SURGEON:  Surgeon(s) and Role:    * Kerin PernaPeter Van Trigt, MD - Primary  PHYSICIAN ASSISTANT: Doree Fudgeonielle Zimmerman PA-C  ANESTHESIA:   general  EBL:  Total I/O In: 1000 [I.V.:1000] Out: 490 [Urine:490]  DRAINS: Chest tubes placed in the mediastinal and pleural spaces   COUNTS CORRECT:  YES  DICTATION: .Dragon Dictation  PLAN OF CARE: Admit to inpatient   PATIENT DISPOSITION:  ICU - intubated and hemodynamically stable.   Delay start of Pharmacological VTE agent (>24hrs) due to surgical blood loss or risk of bleeding: yes  BASELINE WEIGHT: 70 kg

## 2015-10-12 NOTE — Anesthesia Postprocedure Evaluation (Signed)
Anesthesia Post Note  Patient: Shawn Chang  Procedure(s) Performed: Procedure(s) (LRB): CORONARY ARTERY BYPASS GRAFTING (CABG) x five , using left internal mammary artery and bilateral legs greater saphenous vein harvested endoscopically (N/A) TRANSESOPHAGEAL ECHOCARDIOGRAM (TEE) (N/A)  Patient location during evaluation: SICU Anesthesia Type: General Level of consciousness: sedated Pain management: pain level controlled Vital Signs Assessment: post-procedure vital signs reviewed and stable Respiratory status: patient remains intubated per anesthesia plan Cardiovascular status: stable Anesthetic complications: no Comments: Patient on inotropic and vasoactive support with milrinone, dopamine, phenylephrine and sedated with precedex, remains intubated, currently in sinus rhythm at 98, temp is 36.6, SaO2 is 97%    Last Vitals:  Filed Vitals:   10/12/15 0450 10/12/15 1633  BP: 113/74 77/51  Pulse: 66 100  Temp: 36.7 C   Resp: 17 12    Last Pain:  Filed Vitals:   10/12/15 1633  PainSc: 0-No pain                 Reino KentJudd, Julane Crock J

## 2015-10-12 NOTE — Progress Notes (Signed)
Patient ID: Shawn Chang, male   DOB: 09/04/1961, 54 y.o.   MRN: [id number]   SICU Evening Rounds:   Hemodynamically stable  CI = 2.9 on milrinone and dopamine  Has started to wake up on vent.  Urine output good  CT output low  CBC    Component Value Date/Time   WBC 18.5* 10/12/2015 1630   RBC 3.22* 10/12/2015 1630   HGB 10.5* 10/12/2015 1639   HCT 31.0* 10/12/2015 1639   PLT 114* 10/12/2015 1630   MCV 90.4 10/12/2015 1630   MCH 30.4 10/12/2015 1630   MCHC 33.7 10/12/2015 1630   RDW 12.9 10/12/2015 1630     BMET    Component Value Date/Time   NA 139 10/12/2015 1639   K 3.7 10/12/2015 1639   CL 101 10/12/2015 1511   CO2 25 10/11/2015 0140   GLUCOSE 111* 10/12/2015 1639   BUN 14 10/12/2015 1511   CREATININE 0.90 10/12/2015 1511   CREATININE 1.10 09/09/2015 1351   CALCIUM 9.4 10/11/2015 0140   GFRNONAA 58* 10/11/2015 0140   GFRNONAA 76 09/09/2015 1351   GFRAA >60 10/11/2015 0140   GFRAA 88 09/09/2015 1351     A/P:  Stable postop course. Continue current plans

## 2015-10-12 NOTE — Anesthesia Preprocedure Evaluation (Addendum)
Anesthesia Evaluation  Patient identified by MRN, date of birth, ID band Patient awake    Reviewed: Allergy & Precautions, NPO status , Patient's Chart, lab work & pertinent test results, reviewed documented beta blocker date and time   Airway Mallampati: III  TM Distance: >3 FB Neck ROM: Full    Dental  (+) Teeth Intact, Dental Advisory Given, Chipped Has back right molar chipped teeth:   Pulmonary Current Smoker,    Pulmonary exam normal breath sounds clear to auscultation       Cardiovascular hypertension, Pt. on medications and Pt. on home beta blockers + angina with exertion + CAD   Rhythm:Regular Rate:Normal  Echo with preserved EF per report, mild AI, normal RV function   Neuro/Psych    GI/Hepatic   Endo/Other    Renal/GU negative Renal ROS     Musculoskeletal   Abdominal   Peds  Hematology negative hematology ROS (+)   Anesthesia Other Findings   Reproductive/Obstetrics                           Anesthesia Physical Anesthesia Plan  ASA: IV  Anesthesia Plan: General   Post-op Pain Management:    Induction: Intravenous  Airway Management Planned: Oral ETT  Additional Equipment: Arterial line, CVP, PA Cath, TEE and Ultrasound Guidance Line Placement  Intra-op Plan:   Post-operative Plan: Post-operative intubation/ventilation  Informed Consent: I have reviewed the patients History and Physical, chart, labs and discussed the procedure including the risks, benefits and alternatives for the proposed anesthesia with the patient or authorized representative who has indicated his/her understanding and acceptance.   Dental advisory given  Plan Discussed with: CRNA and Anesthesiologist  Anesthesia Plan Comments:         Anesthesia Quick Evaluation

## 2015-10-12 NOTE — Progress Notes (Signed)
The patient was examined and preop studies reviewed. There has been no change from the prior exam and the patient is ready for surgery.  Plan CABG with L radial artey on A Thelen

## 2015-10-12 NOTE — Anesthesia Procedure Notes (Addendum)
Central Venous Catheter Insertion Performed by: anesthesiologist Patient location: Pre-op. Preanesthetic checklist: patient identified, IV checked, site marked, risks and benefits discussed, surgical consent, monitors and equipment checked, pre-op evaluation, timeout performed and anesthesia consent Lidocaine 1% used for infiltration Landmarks identified and Seldinger technique used Catheter size: 8.5 Fr Sheath introducer Procedure performed using ultrasound guided technique. Attempts: 1 Following insertion, line sutured, dressing applied and Biopatch. Post procedure assessment: blood return through all ports, free fluid flow and no air. Patient tolerated the procedure well with no immediate complications.    Central Venous Catheter Insertion Performed by: anesthesiologist Patient location: Pre-op. Preanesthetic checklist: patient identified, IV checked, site marked, risks and benefits discussed, surgical consent, monitors and equipment checked, pre-op evaluation, timeout performed and anesthesia consent Position: Trendelenburg Lidocaine 1% used for infiltration Landmarks identified PA cath was placed.Swan type and PA catheter depth:thermodilation and 50PA Cath depth:50 Procedure performed using ultrasound guided technique. Attempts: 1 Patient tolerated the procedure well with no immediate complications.    Procedure Name: Intubation Date/Time: 10/12/2015 9:29 AM Performed by: Reine JustFLOWERS, Shawn Aceituno T Pre-anesthesia Checklist: Patient identified, Emergency Drugs available, Suction available, Patient being monitored and Timeout performed Patient Re-evaluated:Patient Re-evaluated prior to inductionOxygen Delivery Method: Circle system utilized and Simple face mask Preoxygenation: Pre-oxygenation with 100% oxygen Intubation Type: IV induction Ventilation: Mask ventilation with difficulty and Oral airway inserted - appropriate to patient size Laryngoscope Size: Miller and 3 Grade View:  Grade I Tube type: Subglottic suction tube Tube size: 8.0 mm Number of attempts: 1 Airway Equipment and Method: Patient positioned with wedge pillow and Stylet Placement Confirmation: ETT inserted through vocal cords under direct vision,  positive ETCO2 and breath sounds checked- equal and bilateral Secured at: 23 cm Tube secured with: Tape Dental Injury: Teeth and Oropharynx as per pre-operative assessment

## 2015-10-12 NOTE — Progress Notes (Signed)
Patient notified about the delay in his surgery. He is very upset, but stated that he understands. Will keep him and wife updated when OR calls about timing.

## 2015-10-12 NOTE — Transfer of Care (Signed)
Immediate Anesthesia Transfer of Care Note  Patient: Shawn Chang  Procedure(s) Performed: Procedure(s): CORONARY ARTERY BYPASS GRAFTING (CABG) x five , using left internal mammary artery and bilateral legs greater saphenous vein harvested endoscopically (N/A) TRANSESOPHAGEAL ECHOCARDIOGRAM (TEE) (N/A)  Patient Location: SICU  Anesthesia Type:General  Level of Consciousness: Patient remains intubated per anesthesia plan  Airway & Oxygen Therapy: Patient remains intubated per anesthesia plan and Patient placed on Ventilator (see vital sign flow sheet for setting)  Post-op Assessment: Report given to RN and Post -op Vital signs reviewed and stable  Post vital signs: Reviewed and stable  Last Vitals:  Filed Vitals:   10/12/15 0450 10/12/15 1633  BP: 113/74 77/51  Pulse: 66 100  Temp: 36.7 C   Resp: 17 12    Complications: No apparent anesthesia complications

## 2015-10-12 NOTE — Progress Notes (Signed)
Patient pre-extubation parameters : -20 NIF, 1.0L VC. Patient extubated at 2050 to [facility]. Patient tolerated procedure well with no apparent complications.

## 2015-10-13 ENCOUNTER — Encounter (HOSPITAL_COMMUNITY): Payer: Self-pay | Admitting: Cardiothoracic Surgery

## 2015-10-13 ENCOUNTER — Other Ambulatory Visit: Payer: Self-pay

## 2015-10-13 ENCOUNTER — Inpatient Hospital Stay (HOSPITAL_COMMUNITY): Payer: PRIVATE HEALTH INSURANCE

## 2015-10-13 LAB — POCT I-STAT, CHEM 8
BUN: 17 mg/dL (ref 6–20)
CALCIUM ION: 1.17 mmol/L (ref 1.12–1.23)
CHLORIDE: 97 mmol/L — AB (ref 101–111)
Creatinine, Ser: 1.2 mg/dL (ref 0.61–1.24)
GLUCOSE: 182 mg/dL — AB (ref 65–99)
HCT: 28 % — ABNORMAL LOW (ref 39.0–52.0)
HEMOGLOBIN: 9.5 g/dL — AB (ref 13.0–17.0)
Potassium: 4 mmol/L (ref 3.5–5.1)
SODIUM: 136 mmol/L (ref 135–145)
TCO2: 22 mmol/L (ref 0–100)

## 2015-10-13 LAB — MAGNESIUM
MAGNESIUM: 2.4 mg/dL (ref 1.7–2.4)
Magnesium: 2.4 mg/dL (ref 1.7–2.4)

## 2015-10-13 LAB — CREATININE, SERUM
CREATININE: 1.48 mg/dL — AB (ref 0.61–1.24)
GFR calc Af Amer: >60 mL/min (ref >60)
GFR, EST NON AFRICAN AMERICAN: 52 mL/min — AB (ref >60)

## 2015-10-13 LAB — GLUCOSE, CAPILLARY
GLUCOSE-CAPILLARY: 118 mg/dL — AB (ref 65–99)
GLUCOSE-CAPILLARY: 143 mg/dL — AB (ref 65–99)
GLUCOSE-CAPILLARY: 116 mg/dL — AB (ref 65–99)
Glucose-Capillary: 143 mg/dL — ABNORMAL HIGH (ref 65–99)
Glucose-Capillary: 149 mg/dL — ABNORMAL HIGH (ref 65–99)
Glucose-Capillary: 146 mg/dL — ABNORMAL HIGH (ref 65–99)

## 2015-10-13 LAB — BASIC METABOLIC PANEL
ANION GAP: 5 (ref 5–15)
BUN: 14 mg/dL (ref 6–20)
CALCIUM: 7.5 mg/dL — AB (ref 8.9–10.3)
CHLORIDE: 104 mmol/L (ref 101–111)
CO2: 25 mmol/L (ref 22–32)
Creatinine, Ser: 1.09 mg/dL (ref 0.61–1.24)
GFR calc non Af Amer: >60 mL/min (ref >60)
GLUCOSE: 162 mg/dL — AB (ref 65–99)
POTASSIUM: 4.2 mmol/L (ref 3.5–5.1)
Sodium: 134 mmol/L — ABNORMAL LOW (ref 135–145)

## 2015-10-13 LAB — CBC
HCT: 23.8 % — ABNORMAL LOW (ref 39.0–52.0)
HEMATOCRIT: 24.3 % — AB (ref 39.0–52.0)
HEMOGLOBIN: 7.9 g/dL — AB (ref 13.0–17.0)
HEMOGLOBIN: 7.8 g/dL — AB (ref 13.0–17.0)
MCH: 29.8 pg (ref 26.0–34.0)
MCH: 30.5 pg (ref 26.0–34.0)
MCHC: 32.5 g/dL (ref 30.0–36.0)
MCHC: 32.8 g/dL (ref 30.0–36.0)
MCV: 91.7 fL (ref 78.0–100.0)
MCV: 93 fL (ref 78.0–100.0)
PLATELETS: 102 10*3/uL — AB (ref 150–400)
Platelets: 106 10*3/uL — ABNORMAL LOW (ref 150–400)
RBC: 2.65 MIL/uL — ABNORMAL LOW (ref 4.22–5.81)
RBC: 2.56 MIL/uL — ABNORMAL LOW (ref 4.22–5.81)
RDW: 12.9 % (ref 11.5–15.5)
RDW: 13.4 % (ref 11.5–15.5)
WBC: 11.4 10*3/uL — AB (ref 4.0–10.5)
WBC: 13.5 10*3/uL — ABNORMAL HIGH (ref 4.0–10.5)

## 2015-10-13 MED ORDER — PROMETHAZINE HCL 25 MG/ML IJ SOLN
12.5000 mg | INTRAMUSCULAR | Status: DC | PRN
  Filled 2015-10-13: qty 1

## 2015-10-13 MED ORDER — HYDROMORPHONE HCL 2 MG PO TABS
2.0000 mg | ORAL_TABLET | ORAL | Status: DC | PRN
  Administered 2015-10-14 (×3): 2 mg via ORAL
  Filled 2015-10-13 (×4): qty 1

## 2015-10-13 MED ORDER — FENTANYL CITRATE (PF) 100 MCG/2ML IJ SOLN
INTRAMUSCULAR | Status: AC
  Filled 2015-10-13: qty 2

## 2015-10-13 MED ORDER — VANCOMYCIN HCL IN DEXTROSE 1-5 GM/200ML-% IV SOLN
1000.0000 mg | Freq: Two times a day (BID) | INTRAVENOUS | Status: AC
  Administered 2015-10-13 (×2): 1000 mg via INTRAVENOUS
  Filled 2015-10-13 (×2): qty 200

## 2015-10-13 MED ORDER — CETYLPYRIDINIUM CHLORIDE 0.05 % MT LIQD
7.0000 mL | Freq: Two times a day (BID) | OROMUCOSAL | Status: DC
  Administered 2015-10-13 – 2015-10-16 (×4): 7 mL via OROMUCOSAL

## 2015-10-13 MED ORDER — FE FUMARATE-B12-VIT C-FA-IFC PO CAPS
1.0000 | ORAL_CAPSULE | Freq: Three times a day (TID) | ORAL | Status: DC
  Administered 2015-10-13 – 2015-10-16 (×10): 1 via ORAL
  Filled 2015-10-13 (×19): qty 1

## 2015-10-13 MED ORDER — FUROSEMIDE 10 MG/ML IJ SOLN
20.0000 mg | Freq: Two times a day (BID) | INTRAMUSCULAR | Status: AC
  Administered 2015-10-13 – 2015-10-14 (×3): 20 mg via INTRAVENOUS
  Filled 2015-10-13 (×3): qty 2

## 2015-10-13 MED ORDER — DOPAMINE-DEXTROSE 3.2-5 MG/ML-% IV SOLN: 0.0000 ug/kg/min | INTRAVENOUS | Status: DC

## 2015-10-13 MED ORDER — FENTANYL CITRATE (PF) 100 MCG/2ML IJ SOLN
50.0000 ug | INTRAMUSCULAR | Status: DC | PRN
Start: 2015-10-13 — End: 2015-10-14
  Administered 2015-10-13 – 2015-10-14 (×3): 50 ug via INTRAVENOUS
  Filled 2015-10-13 (×2): qty 2

## 2015-10-13 MED ORDER — INSULIN ASPART 100 UNIT/ML ~~LOC~~ SOLN
0.0000 [IU] | SUBCUTANEOUS | Status: DC
  Administered 2015-10-13 (×2): 2 [IU] via SUBCUTANEOUS

## 2015-10-13 MED ORDER — METOCLOPRAMIDE HCL 5 MG/ML IJ SOLN
10.0000 mg | Freq: Four times a day (QID) | INTRAMUSCULAR | Status: AC
  Administered 2015-10-13 – 2015-10-14 (×5): 10 mg via INTRAVENOUS
  Filled 2015-10-13 (×5): qty 2

## 2015-10-13 MED FILL — Dexmedetomidine HCl in NaCl 0.9% IV Soln 400 MCG/100ML: INTRAVENOUS | Qty: 100 | Status: AC

## 2015-10-13 MED FILL — Mannitol IV Soln 20%: INTRAVENOUS | Qty: 500 | Status: AC

## 2015-10-13 MED FILL — Sodium Chloride IV Soln 0.9%: INTRAVENOUS | Qty: 2000 | Status: AC

## 2015-10-13 MED FILL — Heparin Sodium (Porcine) Inj 1000 Unit/ML: INTRAMUSCULAR | Qty: 10 | Status: AC

## 2015-10-13 MED FILL — Sodium Bicarbonate IV Soln 8.4%: INTRAVENOUS | Qty: 50 | Status: AC

## 2015-10-13 MED FILL — Electrolyte-R (PH 7.4) Solution: INTRAVENOUS | Qty: 5000 | Status: AC

## 2015-10-13 MED FILL — Lidocaine HCl IV Inj 20 MG/ML: INTRAVENOUS | Qty: 10 | Status: AC

## 2015-10-13 NOTE — Plan of Care (Signed)
Problem: Phase II - Intermediate Post-Op Goal: Advance Diet Outcome: Not Progressing Pt with nausea

## 2015-10-13 NOTE — Op Note (Signed)
Shawn Chang, Shawn Chang NO.:  [id number]  MEDICAL RECORD NO.:  [id number]  LOCATION:  2S03C                        FACILITY:  [facility]  PHYSICIAN:  Kerin Perna, M.D.  DATE OF BIRTH:  1961/06/08  DATE OF PROCEDURE:  10/12/2015 DATE OF DISCHARGE:                              OPERATIVE REPORT   OPERATION: 1. Coronary artery bypass grafting x5 (left internal mammary artery to     left anterior descending coronary artery, left radial artery free     graft to OM1, sequential saphenous vein graft to OM2 and OM3,     saphenous vein graft to posterior descending). 2. Endoscopic harvest of bilateral lower leg greater saphenous vein.  PREOPERATIVE DIAGNOSES: 1. Class 4 progressive angina. 2. Preoperative non ST elevation myocardial infarction following     unsuccessful attempt at percutaneous coronary intervention of the     right coronary artery resulting in proximal dissection of the     vessel. 3. Severe 3-vessel coronary artery disease.  POSTOPERATIVE DIAGNOSES: 1. Class 4 progressive angina. 2. Preoperative non ST elevation myocardial infarction following     unsuccessful attempt at percutaneous coronary intervention of the     right coronary artery resulting in proximal dissection of the     vessel. 3. Severe 3-vessel coronary artery disease.  SURGEON:  Kerin Perna, MD  ASSISTANT:  Doree Fudge, PA-C and Rowe Clack, PA-C  ANESTHESIA:  General by Dr. Gentry Roch.  CLINICAL NOTE:  The patient is a 54 year old, Caucasian male, smoker with strong family history for coronary artery disease who presents with symptoms of progressive angina and a positive stress test.  Subsequent diagnostic catheterization by Dr. Jacinto Halim demonstrated severe 2-vessel coronary artery disease.  The patient was brought back to the hospital for attempted PCI intervention of the right coronary, but this resulted in vessel dissection and an acute non ST elevation MI.  The patient  had been loaded with Brilinta for several days prior to the attempted PCI and the patient was clinically stable.  An incision was made.  His best therapy would be to allow Brilinta wash out and then surgical coronary revascularization.  The patient understood the indications and benefits of coronary artery bypass grafting for treatment of his severe coronary artery disease.  I discussed the details of surgery on several occasions with the patient and his wife.  I reviewed the risks to him of the operation including risks of bleeding, blood transfusion requirement, stroke, MI, postoperative pulmonary problems including pleural effusion, infection, and death.  After reviewing these issues, he demonstrated his understanding and agreed to proceed with surgery under what I felt was an informed consent.  OPERATIVE FINDINGS: 1. Severely diseased small coronaries, difficult targets. 2. Adequate conduit, but bilateral pain harvested from the legs to     obtain adequate conduit. 3. Improved global LV function on transesophageal echo after     separation from cardiopulmonary bypass. 4. No blood products required for this operation.  OPERATIVE PROCEDURE:  The patient was brought to the operating room, placed supine on the operating table.  General anesthesia was induced under invasive hemodynamic monitoring.  The chest, abdomen, legs, and left arm were prepped  and draped as a sterile field.  A proper time-out was performed.  A sternal incision was made as a left arm incision was performed for radial artery harvest.  Also simultaneously bilateral leg incisions were made for endoscopic vein harvest of the greater saphenous vein.  The left internal mammary artery was harvested as a pedicle graft from its origin at the subclavian vessels.  It was 1.5-mm vessel with adequate flow.  The left radial artery was harvested as a free graft from the left arm and flushed with heparin and papaverine.  There  was a 2.0 mm vessel with good flow.  The greater saphenous vein was harvested endoscopically from the left leg and used for grafting with good quality vein.  The right leg vein was harvested, but was small and somewhat scarred and not used.  Sternal retractor was placed and the pericardium was opened and suspended.  Pursestrings were placed in the ascending aorta and right atrium and heparin was administered.  When the ACT was documented as being therapeutic, the patient was cannulated and placed on cardiopulmonary bypass.  The coronaries were identified for grafting. The right coronary was heavily diseased.  The circumflex vessels were small, but graftable.  The LAD was also small but graftable.  The mammary artery, radial artery, and vein grafts were prepared for the distal anastomoses as cardioplegic cannulas were placed both antegrade and retrograde cold blood cardioplegia.  The patient was cooled to 32 degrees.  Aortic crossclamp was applied.  800 mL of cold blood cardioplegia was delivered in split doses between the antegrade aortic and retrograde coronary sinus catheters.  There was good cardioplegic arrest.  Septal temperature dropped less than 15 degrees.  Cardioplegia was delivered every 20 minutes where the cross-clamp was in place.  The distal coronary anastomoses were performed.  The first distal anastomosis was to the posterior descending branch of right coronary. The right coronary had a 99% stenosis.  A reverse saphenous vein was sewn end-to-side with running 7-0 Prolene where the vessel was adequate size graft, the vessel wall was heavily calcified, but the anastomosis was performed and allowed good flow.  The second distal anastomosis was the sequential vein graft to the OM2 and OM3.  Both vessels were 1.5 mm.  A reverse saphenous vein sewn side- to-side to the OM2 with a running 7-0 Prolene and then continued end-to- side to the OM3 where the anastomosis was also  completed with a running 7-0 Prolene.  There was good flow through the sequential graft and cardioplegia was redosed.  The fourth distal anastomosis was the OM1.  There was a small 1.2-mm vessel.  The left free radial graft was sewn end-to-side with running 8- 0 Prolene and there was good flow through the graft.  Cardioplegia was redosed.  The 5th distal anastomosis was to the mid LAD.  This was a small 1.4-mm vessel.  The left IMA pedicle was brought through an opening and the left lateral pericardium was brought down onto the LAD and sewn end-to- side with running 8-0 Prolene.  There was good flow through the anastomosis after briefly releasing the pedicle bulldog on the mammary artery.  The bulldog was reapplied and the pedicle was secured, epicardium with 6-0 Prolenes.  Cardioplegia was redosed.  With the cross-clamp still in place, 2 proximal vein anastomoses were performed on the ascending aorta with a 4.5 mm punch running 6-0 Prolene.  Prior to removing the crossclamp, air was vented from the coronaries with a dose of retrograde  warm blood cardioplegia.  The crossclamp was removed.  The heart resumed a spontaneous rhythm.  The vein grafts were de-aired and opened, each had good flow.  The cardioplegia cannula was removed.  The proximal anastomosis of the radial artery free graft was made to the hood of the vein graft to the right coronary and an end-to-side anastomosis using running 7-0 Prolene.  When the bulldog was released, there was excellent flow through all the grafts.  Hemostasis was documented at the proximal distal anastomoses.  The patient was rewarmed and reperfused.  The lungs were expanded.  The ventilator was resumed.  The patient was then weaned off cardiopulmonary bypass with the usual measures.  Low- dose Milrinone was started.  Global LV function was improved. Hemodynamics were stable.  The cannulas were removed and heparin was administered without  adverse reaction.  The patient remained stable.  The superior pericardial fat was closed over the aorta.  Anterior mediastinal and left pleural chest tube were placed and brought out through separate incisions.  The sternum was closed with interrupted steel wire.  The patient remained stable.  The pectoralis fascia and subcutaneous layers were closed in running Vicryl.  The skin was closed with a subcuticular.  The patient returned to the ICU in stable condition.  Total cardiopulmonary bypass time was 154 minutes.     Kerin PernaPeter Van Trigt, M.D.     PV/MEDQ  D:  10/12/2015  T:  10/13/2015  Job:  [id number]  cc:   Pamella PertJagadeesh R. Ganji, MD

## 2015-10-13 NOTE — Progress Notes (Signed)
Patient ID: Shawn Chang, male   DOB: 10/04/1961, 54 y.o.   MRN: [id number] EVENING ROUNDS NOTE :     [address]       [place] [id number]             [phone]                 1 Day Post-Op Procedure(s) (LRB): CORONARY ARTERY BYPASS GRAFTING (CABG) x five , using left internal mammary artery and bilateral legs greater saphenous vein harvested endoscopically (N/A) TRANSESOPHAGEAL ECHOCARDIOGRAM (TEE) (N/A)  Total Length of Stay:  LOS: 5 days  BP 112/69 mmHg  Pulse 93  Temp(Src) 98.7 F (37.1 C) (Oral)  Resp 20  Ht 5\' 6"  (1.676 m)  Wt 169 lb 1.5 oz (76.7 kg)  BMI 27.31 kg/m2  SpO2 99%  .Intake/Output      12/14 0701 - 12/15 0700   P.O. 480   I.V. (mL/kg) 413.2 (5.4)   Blood    IV Piggyback 300   Total Intake(mL/kg) 1193.2 (15.6)   Urine (mL/kg/hr) 1470 (1.4)   Blood    Chest Tube 110 (0.1)   Total Output 1580   Net -386.8         . sodium chloride Stopped (10/13/15 1100)  . sodium chloride    . sodium chloride    . DOPamine Stopped (10/13/15 1000)  . lactated ringers    . lactated ringers 20 mL/hr (10/13/15 1100)  . phenylephrine (NEO-SYNEPHRINE) Adult infusion Stopped (10/13/15 1100)     Lab Results  Component Value Date   WBC 13.5* 10/13/2015   HGB 9.5* 10/13/2015   HCT 28.0* 10/13/2015   PLT 102* 10/13/2015   GLUCOSE 182* 10/13/2015   CHOL 105* 09/09/2015   TRIG 80 09/09/2015   HDL 31* 09/09/2015   LDLCALC 58 09/09/2015   ALT 31 10/11/2015   AST 41 10/11/2015   NA 136 10/13/2015   K 4.0 10/13/2015   CL 97* 10/13/2015   CREATININE 1.20 10/13/2015   BUN 17 10/13/2015   CO2 25 10/13/2015   TSH 0.597 09/09/2015   INR 1.51* 10/12/2015   HGBA1C 5.4 10/11/2015   Stable day, walked 150 feet   Delight OvensEdward B Mylasia Vorhees MD  Beeper [phone] Office [phone] 10/13/2015 8:34 PM

## 2015-10-13 NOTE — Progress Notes (Addendum)
[facility] DAILY ICU PROGRESS NOTE                   [address]            [place] [address]          [phone]   1 Day Post-Op Procedure(s) (LRB): CORONARY ARTERY BYPASS GRAFTING (CABG) x five , using left internal mammary artery and bilateral legs greater saphenous vein harvested endoscopically (N/A) TRANSESOPHAGEAL ECHOCARDIOGRAM (TEE) (N/A)  Total Length of Stay:  LOS: 5 days   Subjective: Other than nausea, he feels reasonably well  Objective: Vital signs in last 24 hours: Temp:  [98.1 F (36.7 C)-99 F (37.2 C)] 98.4 F (36.9 C) (12/14 0700) Pulse Rate:  [78-145] 86 (12/14 0700) Cardiac Rhythm:  [-] Normal sinus rhythm (12/13 2000) Resp:  [10-22] 22 (12/14 0700) BP: (70-110)/(37-74) 99/60 mmHg (12/14 0700) SpO2:  [97 %-100 %] 100 % (12/14 0700) Arterial Line BP: (95-156)/(44-66) 136/55 mmHg (12/14 0700) FiO2 (%):  [40 %-50 %] 40 % (12/13 2003) Weight:  [169 lb 1.5 oz (76.7 kg)] 169 lb 1.5 oz (76.7 kg) (12/14 0600)  Filed Weights   10/08/15 0541 10/10/15 1045 10/13/15 0600  Weight: 156 lb (70.761 kg) 154 lb 5.2 oz (70 kg) 169 lb 1.5 oz (76.7 kg)    Weight change:    Hemodynamic parameters for last 24 hours: PAP: (20-32)/(10-20) 32/18 mmHg CO:  [4.3 L/min-6.5 L/min] 6.5 L/min CI:  [2.4 L/min/m2-3.7 L/min/m2] 3.7 L/min/m2  Intake/Output from previous day: 12/13 0701 - 12/14 0700 In: 6353.1 [I.V.:4514.1; Blood:389; IV Piggyback:1450] Out: 3970 [Urine:2745; Blood:950; Chest Tube:275]  Intake/Output this shift:    Current Meds: Scheduled Meds: . acetaminophen  1,000 mg Oral 4 times per day   Or  . acetaminophen (TYLENOL) oral liquid 160 mg/5 mL  1,000 mg Per Tube 4 times per day  . antiseptic oral rinse  7 mL Mouth Rinse QID  . aspirin EC  325 mg Oral Daily   Or  . aspirin  324 mg Per Tube Daily  . atorvastatin  80 mg Oral Daily  . bisacodyl  10 mg Oral Daily   Or  . bisacodyl  10 mg Rectal Daily  . budesonide-formoterol  2 puff  Inhalation BID  . buPROPion  150 mg Oral BID  . cefUROXime (ZINACEF)  IV  1.5 g Intravenous Q12H  . chlorhexidine gluconate  15 mL Mouth Rinse BID  . docusate sodium  200 mg Oral Daily  . famotidine (PEPCID) IV  20 mg Intravenous Q12H  . insulin regular  0-10 Units Intravenous TID WC  . isosorbide mononitrate  15 mg Oral Daily  . ketorolac  15 mg Intravenous 4 times per day  . levalbuterol  1.25 mg Nebulization 4 times per day  . loratadine  10 mg Oral Daily  . metoprolol tartrate  12.5 mg Oral BID   Or  . metoprolol tartrate  12.5 mg Per Tube BID  . [START ON 10/14/2015] pantoprazole  40 mg Oral Daily  . sodium chloride  3 mL Intravenous Q12H   Continuous Infusions: . sodium chloride 20 mL/hr at 10/13/15 0625  . sodium chloride    . sodium chloride    . dexmedetomidine 0.303 mcg/kg/hr (10/13/15 9147)  . DOPamine 2.971 mcg/kg/min (10/13/15 0625)  . insulin (NOVOLIN-R) infusion Stopped (10/12/15 1650)  . lactated ringers    . lactated ringers    . milrinone 0.25 mcg/kg/min (10/13/15 8295)  . nitroGLYCERIN 5 mcg/min (10/13/15  [place])  . phenylephrine (NEO-SYNEPHRINE) Adult infusion 20 mcg/min (10/13/15 0625)   PRN Meds:.sodium chloride, albumin human, fluticasone, metoprolol, midazolam, morphine injection, ondansetron (ZOFRAN) IV, oxyCODONE, sodium chloride, traMADol  General appearance: alert, cooperative and no distress Heart: regular rate and rhythm Lungs: mildly dim in bases Abdomen: benign Extremities: minimal edema Wound: incis dressings CDI- left arm some swelling in hand, N/V intact  Lab Results: CBC: Recent Labs  10/12/15 2229 10/13/15 0507  WBC 17.5* 11.4*  HGB 9.1* 7.9*  HCT 27.5* 24.3*  PLT 115* 106*   BMET:  Recent Labs  10/11/15 0140  10/12/15 2218 10/12/15 2229 10/13/15 0507  NA 137  < > 139  --  134*  K 3.5  < > 4.9  --  4.2  CL 102  < > 103  --  104  CO2 25  --   --   --  25  GLUCOSE 99  < > 150*  --  162*  BUN 18  < > 14  --  14  CREATININE  1.36*  < > 1.10 1.20 1.09  CALCIUM 9.4  --   --   --  7.5*  < > = values in this interval not displayed.  PT/INR:  Recent Labs  10/12/15 1630  LABPROT 18.3*  INR 1.51*   Radiology: Dg Chest Port 1 View  10/12/2015  CLINICAL DATA:  Status post coronary artery bypass grafting/hypoxia EXAM: PORTABLE CHEST 1 VIEW COMPARISON:  October 09, 2015 FINDINGS: Endotracheal tube tip is 4.3 cm above the carina. Swan-Ganz catheter tip is in the proximal right main pulmonary artery. Nasogastric tube tip and side port are in the stomach. There is a left chest tube and a mediastinal drain. Temporary pacemaker wires are attached to the right heart. No pneumothorax. Lungs are clear. Heart is upper normal in size with pulmonary vascularity within normal limits. IMPRESSION: Status post coronary artery bypass grafting. Tube and catheter positions as described without apparent pneumothorax. No edema or consolidation. Electronically Signed   By: Bretta Bang III M.D.   On: 10/12/2015 17:10     Assessment/Plan: S/P Procedure(s) (LRB): CORONARY ARTERY BYPASS GRAFTING (CABG) x five , using left internal mammary artery and bilateral legs greater saphenous vein harvested endoscopically (N/A) TRANSESOPHAGEAL ECHOCARDIOGRAM (TEE) (N/A)  1 feel well overall except nausea 2 Hemodyn stable with low dose neo, dop, milrinine- begin to wean as able 3 post op expected acute blood loss anemia- monitor 4 poss d/c chest tubes- output is low 5 renal fxn is normal, magnesium and K+ normal 6 volume overload- diuresing well so far , will need diuresis when BP will allow 7 blood sugars adeq control  GOLD,WAYNE E 10/13/2015 7:31 AM    patient examined and medical record reviewed,agree with above note.cont imdur for radial artery Kathlee Nations Trigt III 10/13/2015    In addition to other potential risks and complications from the surgery, I have made the patient aware of the recent [facility] Health Advisory concerning the risk of  infection by Myocobacterium chimaera related to the use of [employer]ert 3T heater-cooler equipment during cardiac surgery. I discussed with the patient the low risk of infection, as well as our compliance with the most current FDA recommendations to minimize infection and testing of all devices for contamination. The patient has been made aware of the limited alternatives to immediately replacing the current equipment. The patient has been informed regarding the risks associated with waiting to proceed with needed surgery and that such risks are greater than  the risk of infection related to the use of the heater-cooler device. I did make the patient aware that after careful review of the [facility] we have no evidence that heater/cooler related infections have occurred [facility]. We discussed that this is a slow-growing bacterium, such that it can take some period of time for symptoms to develop.

## 2015-10-14 ENCOUNTER — Inpatient Hospital Stay (HOSPITAL_COMMUNITY): Payer: PRIVATE HEALTH INSURANCE

## 2015-10-14 LAB — BASIC METABOLIC PANEL
Anion gap: 5 (ref 5–15)
BUN: 15 mg/dL (ref 6–20)
CO2: 28 mmol/L (ref 22–32)
Calcium: 8 mg/dL — ABNORMAL LOW (ref 8.9–10.3)
Chloride: 100 mmol/L — ABNORMAL LOW (ref 101–111)
Creatinine, Ser: 1.28 mg/dL — ABNORMAL HIGH (ref 0.61–1.24)
GFR calc Af Amer: >60 mL/min (ref >60)
GFR calc non Af Amer: >60 mL/min (ref >60)
Glucose, Bld: 116 mg/dL — ABNORMAL HIGH (ref 65–99)
Potassium: 3.7 mmol/L (ref 3.5–5.1)
Sodium: 133 mmol/L — ABNORMAL LOW (ref 135–145)

## 2015-10-14 LAB — GLUCOSE, CAPILLARY
Glucose-Capillary: 115 mg/dL — ABNORMAL HIGH (ref 65–99)
Glucose-Capillary: 107 mg/dL — ABNORMAL HIGH (ref 65–99)
Glucose-Capillary: 118 mg/dL — ABNORMAL HIGH (ref 65–99)
Glucose-Capillary: 126 mg/dL — ABNORMAL HIGH (ref 65–99)
Glucose-Capillary: 109 mg/dL — ABNORMAL HIGH (ref 65–99)

## 2015-10-14 LAB — CBC
HCT: 22.1 % — ABNORMAL LOW (ref 39.0–52.0)
Hemoglobin: 7.1 g/dL — ABNORMAL LOW (ref 13.0–17.0)
MCH: 29.8 pg (ref 26.0–34.0)
MCHC: 32.1 g/dL (ref 30.0–36.0)
MCV: 92.9 fL (ref 78.0–100.0)
Platelets: 95 10*3/uL — ABNORMAL LOW (ref 150–400)
RBC: 2.38 MIL/uL — ABNORMAL LOW (ref 4.22–5.81)
RDW: 13.5 % (ref 11.5–15.5)
WBC: 11 10*3/uL — ABNORMAL HIGH (ref 4.0–10.5)

## 2015-10-14 MED ORDER — LISINOPRIL 10 MG PO TABS: 10.0000 mg | ORAL_TABLET | Freq: Every day | ORAL | Status: DC

## 2015-10-14 MED ORDER — METOPROLOL TARTRATE 25 MG/10 ML ORAL SUSPENSION
12.5000 mg | Freq: Two times a day (BID) | ORAL | Status: DC
  Filled 2015-10-14 (×2): qty 10

## 2015-10-14 MED ORDER — FENTANYL CITRATE (PF) 100 MCG/2ML IJ SOLN
50.0000 ug | Freq: Once | INTRAMUSCULAR | Status: AC
Start: 2015-10-14 — End: 2015-10-14
  Administered 2015-10-14: 50 ug via INTRAVENOUS
  Filled 2015-10-14: qty 2

## 2015-10-14 MED ORDER — FENTANYL 100 MCG/HR TD PT72
100.0000 ug | MEDICATED_PATCH | TRANSDERMAL | Status: DC
  Administered 2015-10-14: 100 ug via TRANSDERMAL
  Filled 2015-10-14 (×2): qty 1

## 2015-10-14 MED ORDER — SODIUM CHLORIDE 0.9 % IJ SOLN: 3.0000 mL | INTRAMUSCULAR | Status: DC | PRN

## 2015-10-14 MED ORDER — MOVING RIGHT ALONG BOOK
Freq: Once | Status: AC
  Administered 2015-10-14: 11:00:00
  Filled 2015-10-14: qty 1

## 2015-10-14 MED ORDER — MAGNESIUM HYDROXIDE 400 MG/5ML PO SUSP
30.0000 mL | Freq: Every day | ORAL | Status: DC | PRN
  Administered 2015-10-16: 30 mL via ORAL
  Filled 2015-10-14: qty 30

## 2015-10-14 MED ORDER — LISINOPRIL 10 MG PO TABS
10.0000 mg | ORAL_TABLET | Freq: Every day | ORAL | Status: DC
  Administered 2015-10-16: 10 mg via ORAL
  Filled 2015-10-14 (×3): qty 1

## 2015-10-14 MED ORDER — FUROSEMIDE 40 MG PO TABS
40.0000 mg | ORAL_TABLET | Freq: Every day | ORAL | Status: DC
  Administered 2015-10-15 – 2015-10-16 (×2): 40 mg via ORAL
  Filled 2015-10-14 (×3): qty 1

## 2015-10-14 MED ORDER — INSULIN ASPART 100 UNIT/ML ~~LOC~~ SOLN
0.0000 [IU] | Freq: Three times a day (TID) | SUBCUTANEOUS | Status: DC
  Administered 2015-10-14: 2 [IU] via SUBCUTANEOUS

## 2015-10-14 MED ORDER — SODIUM CHLORIDE 0.9 % IJ SOLN
3.0000 mL | Freq: Two times a day (BID) | INTRAMUSCULAR | Status: DC
  Administered 2015-10-14 – 2015-10-16 (×3): 3 mL via INTRAVENOUS

## 2015-10-14 MED ORDER — INSULIN ASPART 100 UNIT/ML ~~LOC~~ SOLN: 0.0000 [IU] | Freq: Three times a day (TID) | SUBCUTANEOUS | Status: DC

## 2015-10-14 MED ORDER — METOPROLOL TARTRATE 25 MG PO TABS
25.0000 mg | ORAL_TABLET | Freq: Two times a day (BID) | ORAL | Status: DC
  Administered 2015-10-14 – 2015-10-16 (×6): 25 mg via ORAL
  Filled 2015-10-14 (×7): qty 1

## 2015-10-14 MED ORDER — LEVALBUTEROL HCL 1.25 MG/0.5ML IN NEBU
1.2500 mg | INHALATION_SOLUTION | Freq: Two times a day (BID) | RESPIRATORY_TRACT | Status: DC
  Administered 2015-10-14 – 2015-10-17 (×5): 1.25 mg via RESPIRATORY_TRACT
  Filled 2015-10-14 (×6): qty 0.5

## 2015-10-14 MED ORDER — POTASSIUM CHLORIDE CRYS ER 20 MEQ PO TBCR
20.0000 meq | EXTENDED_RELEASE_TABLET | Freq: Every day | ORAL | Status: DC
  Administered 2015-10-14 – 2015-10-16 (×3): 20 meq via ORAL
  Filled 2015-10-14 (×4): qty 1

## 2015-10-14 MED ORDER — INSULIN ASPART 100 UNIT/ML ~~LOC~~ SOLN: 0.0000 [IU] | Freq: Every day | SUBCUTANEOUS | Status: DC

## 2015-10-14 MED ORDER — SODIUM CHLORIDE 0.9 % IV SOLN: 250.0000 mL | INTRAVENOUS | Status: DC | PRN

## 2015-10-14 MED ORDER — LEVALBUTEROL HCL 1.25 MG/0.5ML IN NEBU
1.2500 mg | INHALATION_SOLUTION | Freq: Three times a day (TID) | RESPIRATORY_TRACT | Status: DC
  Administered 2015-10-14: 1.25 mg via RESPIRATORY_TRACT
  Filled 2015-10-14: qty 0.5

## 2015-10-14 MED ORDER — POTASSIUM CHLORIDE 10 MEQ/50ML IV SOLN
10.0000 meq | INTRAVENOUS | Status: AC
  Administered 2015-10-14 (×3): 10 meq via INTRAVENOUS
  Filled 2015-10-14 (×3): qty 50

## 2015-10-14 NOTE — Plan of Care (Signed)
Problem: Phase II - Intermediate Post-Op Goal: Maintain Hemodynamic Stability Outcome: Progressing Pt requiring no drips at this time. Goal: Advance Diet Outcome: Progressing Pt still with some nausea. Goal: Activity Progressed Outcome: Progressing Pt OOB x2 today.

## 2015-10-14 NOTE — Progress Notes (Addendum)
[facility] DAILY ICU PROGRESS NOTE                   [address]            [place] [address]          [phone]   2 Days Post-Op Procedure(s) (LRB): CORONARY ARTERY BYPASS GRAFTING (CABG) x five , using left internal mammary artery and bilateral legs greater saphenous vein harvested endoscopically (N/A) TRANSESOPHAGEAL ECHOCARDIOGRAM (TEE) (N/A)  Total Length of Stay:  LOS: 6 days   Subjective: Feels much better, nausea has resolved  Objective: Vital signs in last 24 hours: Temp:  [98.1 F (36.7 C)-99.2 F (37.3 C)] 99.2 F (37.3 C) (12/15 0403) Pulse Rate:  [79-107] 94 (12/15 0600) Cardiac Rhythm:  [-] Normal sinus rhythm (12/15 0400) Resp:  [11-26] 19 (12/15 0600) BP: (87-115)/(52-70) 107/66 mmHg (12/15 0600) SpO2:  [93 %-100 %] 97 % (12/15 0600) Arterial Line BP: (140-173)/(57-69) 140/57 mmHg (12/14 1100) Weight:  [167 lb 12.3 oz (76.1 kg)] 167 lb 12.3 oz (76.1 kg) (12/15 0500)  Filed Weights   10/10/15 1045 10/13/15 0600 10/14/15 0500  Weight: 154 lb 5.2 oz (70 kg) 169 lb 1.5 oz (76.7 kg) 167 lb 12.3 oz (76.1 kg)    Weight change: -1 lb 5.2 oz (-0.6 kg)   Hemodynamic parameters for last 24 hours: PAP: (28-34)/(15-22) 28/17 mmHg CO:  [6.7 L/min] 6.7 L/min CI:  [3.8 L/min/m2] 3.8 L/min/m2  Intake/Output from previous day: 12/14 0701 - 12/15 0700 In: 1573.2 [P.O.:480; I.V.:593.2; IV Piggyback:500] Out: 1972 [Urine:1862; Chest Tube:110]  Intake/Output this shift:    Current Meds: Scheduled Meds: . acetaminophen  1,000 mg Oral 4 times per day   Or  . acetaminophen (TYLENOL) oral liquid 160 mg/5 mL  1,000 mg Per Tube 4 times per day  . antiseptic oral rinse  7 mL Mouth Rinse BID  . aspirin EC  325 mg Oral Daily   Or  . aspirin  324 mg Per Tube Daily  . atorvastatin  80 mg Oral Daily  . bisacodyl  10 mg Oral Daily   Or  . bisacodyl  10 mg Rectal Daily  . budesonide-formoterol  2 puff Inhalation BID  . buPROPion  150 mg Oral BID  .  docusate sodium  200 mg Oral Daily  . ferrous fumarate-b12-vitamic C-folic acid  1 capsule Oral TID PC  . furosemide  20 mg Intravenous BID  . insulin aspart  0-24 Units Subcutaneous 6 times per day  . isosorbide mononitrate  15 mg Oral Daily  . levalbuterol  1.25 mg Nebulization TID  . loratadine  10 mg Oral Daily  . metoCLOPramide (REGLAN) injection  10 mg Intravenous 4 times per day  . metoprolol tartrate  12.5 mg Oral BID   Or  . metoprolol tartrate  12.5 mg Per Tube BID  . pantoprazole  40 mg Oral Daily  . potassium chloride  10 mEq Intravenous Q1 Hr x 3  . sodium chloride  3 mL Intravenous Q12H   Continuous Infusions: . sodium chloride Stopped (10/13/15 1100)  . sodium chloride    . sodium chloride    . DOPamine Stopped (10/13/15 1000)  . lactated ringers 20 mL/hr at 10/13/15 2300  . lactated ringers 20 mL/hr (10/13/15 1100)  . phenylephrine (NEO-SYNEPHRINE) Adult infusion Stopped (10/13/15 1100)   PRN Meds:.sodium chloride, fentaNYL (SUBLIMAZE) injection, fluticasone, HYDROmorphone, metoprolol, midazolam, ondansetron (ZOFRAN) IV, promethazine, sodium chloride, traMADol  General appearance: alert, cooperative and  no distress Heart: regular rate and rhythm and soft systolic murmur Lungs: mildly dim in bases Abdomen: benign Extremities: no signif edema Wound: dressings CDI, L arm is N/V intact  Lab Results: CBC: Recent Labs  10/13/15 1721 10/13/15 1728 10/14/15 0420  WBC 13.5*  --  11.0*  HGB 7.8* 9.5* 7.1*  HCT 23.8* 28.0* 22.1*  PLT 102*  --  95*   BMET:  Recent Labs  10/13/15 0507  10/13/15 1728 10/14/15 0420  NA 134*  --  136 133*  K 4.2  --  4.0 3.7  CL 104  --  97* 100*  CO2 25  --   --  28  GLUCOSE 162*  --  182* 116*  BUN 14  --  17 15  CREATININE 1.09  < > 1.20 1.28*  CALCIUM 7.5*  --   --  8.0*  < > = values in this interval not displayed.  PT/INR:  Recent Labs  10/12/15 1630  LABPROT 18.3*  INR 1.51*   Radiology: No results  found.   Assessment/Plan: S/P Procedure(s) (LRB): CORONARY ARTERY BYPASS GRAFTING (CABG) x five , using left internal mammary artery and bilateral legs greater saphenous vein harvested endoscopically (N/A) TRANSESOPHAGEAL ECHOCARDIOGRAM (TEE) (N/A)  1 cont to make good overall progress 2 ABL anemia is worse-  on iron for now . Tolerating well  3 Thrombocytopenia is slightly worse- monitor, not on lovenox 4 hemodyn stable off gtts 5 SR/ST- increase beta blocker 6 start low dose ACEI, Imdur for radial graft 7 probable tx to 2 west GOLD,WAYNE E 10/14/2015 7:41 AM  Progressing Expected postop blood loss anemia on po Iron tx to stepdown  patient examined and medical record reviewed,agree with above note. Kathlee Nationseter Van Trigt III 10/14/2015

## 2015-10-14 NOTE — Progress Notes (Signed)
Came to ambulate however pt had walked just recently. Discussed CR role with wife and other visitor. Pt in bed, sleeping, very pale. When asked pt sts he is in massive pain. He apparently declined po meds recently and now his wife is requesting it. SBP 98. Encouraged 3rd walk later with RN. Also discussed IS and sitting up in recliner. Looks like pt needs rest for now (did not open eyes during our conversation. Will f/u tomorrow. [phone] Ethelda ChickKristan Jianni Shelden CES, ACSM 2:06 PM 10/14/2015

## 2015-10-15 ENCOUNTER — Inpatient Hospital Stay (HOSPITAL_COMMUNITY): Payer: PRIVATE HEALTH INSURANCE

## 2015-10-15 LAB — CBC
HEMATOCRIT: 20.7 % — AB (ref 39.0–52.0)
HEMOGLOBIN: 6.8 g/dL — AB (ref 13.0–17.0)
MCH: 30.5 pg (ref 26.0–34.0)
MCHC: 32.9 g/dL (ref 30.0–36.0)
MCV: 92.8 fL (ref 78.0–100.0)
PLATELETS: 104 10*3/uL — AB (ref 150–400)
RBC: 2.23 MIL/uL — AB (ref 4.22–5.81)
RDW: 13.7 % (ref 11.5–15.5)
WBC: 13.9 10*3/uL — AB (ref 4.0–10.5)

## 2015-10-15 LAB — GLUCOSE, CAPILLARY
GLUCOSE-CAPILLARY: 102 mg/dL — AB (ref 65–99)
GLUCOSE-CAPILLARY: 108 mg/dL — AB (ref 65–99)
GLUCOSE-CAPILLARY: 99 mg/dL (ref 65–99)
Glucose-Capillary: 105 mg/dL — ABNORMAL HIGH (ref 65–99)

## 2015-10-15 LAB — BASIC METABOLIC PANEL
Anion gap: 5 (ref 5–15)
BUN: 20 mg/dL (ref 6–20)
CO2: 29 mmol/L (ref 22–32)
Calcium: 8.3 mg/dL — ABNORMAL LOW (ref 8.9–10.3)
Chloride: 99 mmol/L — ABNORMAL LOW (ref 101–111)
Creatinine, Ser: 1.33 mg/dL — ABNORMAL HIGH (ref 0.61–1.24)
GFR calc Af Amer: >60 mL/min (ref >60)
GFR calc non Af Amer: 59 mL/min — ABNORMAL LOW (ref >60)
Glucose, Bld: 116 mg/dL — ABNORMAL HIGH (ref 65–99)
Potassium: 5 mmol/L (ref 3.5–5.1)
Sodium: 133 mmol/L — ABNORMAL LOW (ref 135–145)

## 2015-10-15 LAB — PREPARE RBC (CROSSMATCH)

## 2015-10-15 MED ORDER — SODIUM CHLORIDE 0.9 % IV SOLN
Freq: Once | INTRAVENOUS | Status: AC
  Administered 2015-10-15: 10:00:00 via INTRAVENOUS

## 2015-10-15 MED ORDER — FUROSEMIDE 10 MG/ML IJ SOLN
20.0000 mg | Freq: Once | INTRAMUSCULAR | Status: AC
  Administered 2015-10-15: 20 mg via INTRAVENOUS
  Filled 2015-10-15: qty 2

## 2015-10-15 NOTE — Progress Notes (Signed)
[address]       [place] [address]             [id number]      3 Days Post-Op Procedure(s) (LRB): CORONARY ARTERY BYPASS GRAFTING (CABG) x five , using left internal mammary artery and bilateral legs greater saphenous vein harvested endoscopically (N/A) TRANSESOPHAGEAL ECHOCARDIOGRAM (TEE) (N/A) Subjective: Issues with pain yesterday, improved  Objective: Vital signs in last 24 hours: Temp:  [97.6 F (36.4 C)-99 F (37.2 C)] 98.9 F (37.2 C) (12/16 0512) Pulse Rate:  [82-103] 92 (12/16 0512) Cardiac Rhythm:  [-] Normal sinus rhythm (12/15 1948) Resp:  [15-21] 18 (12/16 0512) BP: (101-123)/(60-74) 108/71 mmHg (12/16 0512) SpO2:  [93 %-99 %] 97 % (12/16 0512) Weight:  [166 lb 8 oz (75.524 kg)] 166 lb 8 oz (75.524 kg) (12/16 0512)  Hemodynamic parameters for last 24 hours:    Intake/Output from previous day: 12/15 0701 - 12/16 0700 In: 120 [I.V.:20; IV Piggyback:100] Out: 725 [Urine:725] Intake/Output this shift:    General appearance: alert, cooperative and no distress Heart: regular rate and rhythm Lungs: dim in lower fields Abdomen: benign Extremities: no edema Wound: incis healing well at radial/evh site, chest incis still has aquacell  Lab Results:  Recent Labs  10/14/15 0420 10/15/15 0250  WBC 11.0* 13.9*  HGB 7.1* 6.8*  HCT 22.1* 20.7*  PLT 95* 104*   BMET:  Recent Labs  10/14/15 0420 10/15/15 0250  NA 133* 133*  K 3.7 5.0  CL 100* 99*  CO2 28 29  GLUCOSE 116* 116*  BUN 15 20  CREATININE 1.28* 1.33*  CALCIUM 8.0* 8.3*    PT/INR:  Recent Labs  10/12/15 1630  LABPROT 18.3*  INR 1.51*   ABG    Component Value Date/Time   PHART 7.354 10/12/2015 2218   HCO3 25.0* 10/12/2015 2218   TCO2 22 10/13/2015 1728   ACIDBASEDEF 1.0 10/12/2015 2218   O2SAT 98.0 10/12/2015 2218   CBG (last 3)   Recent Labs  10/14/15 1634 10/14/15 2126 10/15/15 0604  GLUCAP 126* 109* 105*    Meds Scheduled Meds: .  acetaminophen  1,000 mg Oral 4 times per day   Or  . acetaminophen (TYLENOL) oral liquid 160 mg/5 mL  1,000 mg Per Tube 4 times per day  . antiseptic oral rinse  7 mL Mouth Rinse BID  . aspirin EC  325 mg Oral Daily   Or  . aspirin  324 mg Per Tube Daily  . atorvastatin  80 mg Oral Daily  . bisacodyl  10 mg Oral Daily   Or  . bisacodyl  10 mg Rectal Daily  . budesonide-formoterol  2 puff Inhalation BID  . buPROPion  150 mg Oral BID  . docusate sodium  200 mg Oral Daily  . fentaNYL  100 mcg Transdermal Q72H  . ferrous fumarate-b12-vitamic C-folic acid  1 capsule Oral TID PC  . furosemide  40 mg Oral Daily  . insulin aspart  0-24 Units Subcutaneous TID AC & HS  . isosorbide mononitrate  15 mg Oral Daily  . levalbuterol  1.25 mg Nebulization BID  . lisinopril  10 mg Oral Daily  . loratadine  10 mg Oral Daily  . metoprolol tartrate  25 mg Oral BID   Or  . metoprolol tartrate  12.5 mg Per Tube BID  . pantoprazole  40 mg Oral Daily  . potassium chloride  20 mEq Oral Daily  . sodium chloride  3 mL Intravenous Q12H  . sodium chloride  3 mL Intravenous Q12H   Continuous Infusions: . sodium chloride Stopped (10/13/15 1100)  . sodium chloride    . sodium chloride    . lactated ringers 20 mL/hr (10/13/15 1100)   PRN Meds:.sodium chloride, sodium chloride, fluticasone, HYDROmorphone, magnesium hydroxide, metoprolol, midazolam, ondansetron (ZOFRAN) IV, promethazine, sodium chloride, sodium chloride, traMADol  Xrays Dg Chest Port 1 View  10/14/2015  CLINICAL DATA:  Status post CABG. EXAM: PORTABLE CHEST 1 VIEW COMPARISON:  10/13/2015 FINDINGS: Swan-Ganz catheter, mediastinal drain and left chest tube have been removed. No pneumothorax identified. Lungs show bilateral lower lobe atelectasis, left greater than right. Likely is a small left pleural effusion. No overt edema. The heart size and mediastinal contours are stable. IMPRESSION: No pneumothorax. Bilateral lower lobe atelectasis, left  greater than right. Electronically Signed   By: Irish LackGlenn  Yamagata M.D.   On: 10/14/2015 08:52    Assessment/Plan: S/P Procedure(s) (LRB): CORONARY ARTERY BYPASS GRAFTING (CABG) x five , using left internal mammary artery and bilateral legs greater saphenous vein harvested endoscopically (N/A) TRANSESOPHAGEAL ECHOCARDIOGRAM (TEE) (N/A)  1 tachy at times and still on O2. HCT down to 20, will give one unit PRBC's for symptomatic ABL anemia 2 platelets trending improved thrombocytopenia. Some increase leukocytosis, no fevers, monitor 3 hemodyn stable 4 pulm toilet- also has small effusions, cont lasix, monitor Creat- 1.3 currently. Wean O2 off 5 imdur for radial graft 6 poss d/c 24-48 hours  LOS: 7 days    River Mckercher E 10/15/2015

## 2015-10-15 NOTE — Discharge Instructions (Signed)
Endoscopic Saphenous Vein Harvesting, Care After °Refer to this sheet in the next few weeks. These instructions provide you with information on caring for yourself after your procedure. Your health care provider may also give you more specific instructions. Your treatment has been planned according to current medical practices, but problems sometimes occur. Call your health care provider if you have any problems or questions after your procedure. °HOME CARE INSTRUCTIONS °Medicine °· Take whatever pain medicine your surgeon prescribes. Follow the directions carefully. Do not take over-the-counter pain medicine unless your surgeon says it is okay. Some pain medicine can cause bleeding problems for several weeks after surgery. °· Follow your surgeon's instructions about driving. You will probably not be permitted to drive after heart surgery. °· Take any medicines your surgeon prescribes. Any medicines you took before your heart surgery should be checked with your health care provider before you start taking them again. °Wound care °· If your surgeon has prescribed an elastic bandage or stocking, ask how long you should wear it. °· Check the area around your surgical cuts (incisions) whenever your bandages (dressings) are changed. Look for any redness or swelling. °· You will need to return to have the stitches (sutures) or staples taken out. Ask your surgeon when to do that. °· Ask your surgeon when you can shower or bathe. °Activity °· Try to keep your legs raised when you are sitting. °· Do any exercises your health care providers have given you. These may include deep breathing exercises, coughing, walking, or other exercises. °SEEK MEDICAL CARE IF: °· You have any questions about your medicines. °· You have more leg pain, especially if your pain medicine stops working. °· New or growing bruises develop on your leg. °· Your leg swells, feels tight, or becomes red. °· You have numbness in your leg. °SEEK IMMEDIATE  MEDICAL CARE IF: °· Your pain gets much worse. °· Blood or fluid leaks from any of the incisions. °· Your incisions become warm, swollen, or red. °· You have chest pain. °· You have trouble breathing. °· You have a fever. °· You have more pain near your leg incision. °MAKE SURE YOU: °· Understand these instructions. °· Will watch your condition. °· Will get help right away if you are not doing well or get worse. °  °This information is not intended to replace advice given to you by your health care provider. Make sure you discuss any questions you have with your health care provider. °  °Document Released: 06/28/2011 Document Revised: 11/06/2014 Document Reviewed: 06/28/2011 °Elsevier Interactive Patient Education ©2016 [employer] °Coronary Artery Bypass Grafting, Care After °These instructions give you information on caring for yourself after your procedure. Your doctor may also give you more specific instructions. Call your doctor if you have any problems or questions after your procedure.  °HOME CARE °· Only take medicine as told by your doctor. Take medicines exactly as told. Do not stop taking medicines or start any new medicines without talking to your doctor first. °· Take your pulse as told by your doctor. °· Do deep breathing as told by your doctor. Use your breathing device (incentive spirometer), if given, to practice deep breathing several times a day. Support your chest with a pillow or your arms when you take deep breaths or cough. °· Keep the area clean, dry, and protected where the surgery cuts (incisions) were made. Remove bandages (dressings) only as told by your doctor. If strips were applied to surgical area, do not take   them off. They fall off on their own. °· Check the surgery area daily for puffiness (swelling), redness, or leaking fluid. °· If surgery cuts were made in your legs: °· Avoid crossing your legs. °· Avoid sitting for long periods of time. Change positions every 30  minutes. °· Raise your legs when you are sitting. Place them on pillows. °· Wear stockings that help keep blood clots from forming in your legs (compression stockings). °· Only take sponge baths until your doctor says it is okay to take showers. Pat the surgery area dry. Do not rub the surgery area with a washcloth or towel. Do not bathe, swim, or use a hot tub until your doctor says it is okay. °· Eat foods that are high in fiber. These include raw fruits and vegetables, whole grains, beans, and nuts. Choose lean meats. Avoid canned, processed, and fried foods. °· Drink enough fluids to keep your pee (urine) clear or pale yellow. °· Weigh yourself every day. °· Rest and limit activity as told by your doctor. You may be told to: °· Stop any activity if you have chest pain, shortness of breath, changes in heartbeat, or dizziness. Get help right away if this happens. °· Move around often for short amounts of time or take short walks as told by your doctor. Gradually become more active. You may need help to strengthen your muscles and build endurance. °· Avoid lifting, pushing, or pulling anything heavier than 10 pounds (4.5 kg) for at least 6 weeks after surgery. °· Do not drive until your doctor says it is okay. °· Ask your doctor when you can go back to work. °· Ask your doctor when you can begin sexual activity again. °· Follow up with your doctor as told. °GET HELP IF: °· You have puffiness, redness, more pain, or fluid draining from the incision site. °· You have a fever. °· You have puffiness in your ankles or legs. °· You have pain in your legs. °· You gain 2 or more pounds (0.9 kg) a day. °· You feel sick to your stomach (nauseous) or throw up (vomit). °· You have watery poop (diarrhea). °GET HELP RIGHT AWAY IF: °· You have chest pain that goes to your jaw or arms. °· You have shortness of breath. °· You have a fast or irregular heartbeat. °· You notice a "clicking" in your breastbone when you move. °· You  have numbness or weakness in your arms or legs. °· You feel dizzy or light-headed. °MAKE SURE YOU: °· Understand these instructions. °· Will watch your condition. °· Will get help right away if you are not doing well or get worse. °  °This information is not intended to replace advice given to you by your health care provider. Make sure you discuss any questions you have with your health care provider. °  °Document Released: 10/21/2013 Document Reviewed: 10/21/2013 °Elsevier Interactive Patient Education ©2016 [employer] ° °

## 2015-10-15 NOTE — Care Management Note (Signed)
Case Management Note Donn PieriniKristi Kyrin Garn RN, BSN Unit 2W-Case Manager [phone]  Patient Details  Name: Shawn Chang J Frost MRN: [id number] Date of Birth: 05/29/1961  Subjective/Objective:  Pt s/p CABGx5                    Action/Plan: PTA pt lived at home with spouse- anticipate return home- CM to follow for any d/c needs  Expected Discharge Date:                  Expected Discharge Plan:  Home/Self Care  In-House Referral:     Discharge planning Services  CM Consult  Post Acute Care Choice:    Choice offered to:     DME Arranged:    DME Agency:     HH Arranged:    HH Agency:     Status of Service:  In process, will continue to follow  Medicare Important Message Given:    Date Medicare IM Given:    Medicare IM give by:    Date Additional Medicare IM Given:    Additional Medicare Important Message give by:     If discussed at Long Length of Stay Meetings, dates discussed:    Additional Comments:  Darrold SpanWebster, Anjanette Gilkey Hall, RN 10/15/2015, 10:28 AM

## 2015-10-15 NOTE — Progress Notes (Addendum)
CRITICAL VALUE ALERT  Critical value received:  Hgb 6.8  Date of notification:  10/15/15  Time of notification:  0420  Critical value read back:yes  Nurse who received alert:  Roselie AwkwardMegan Tauren Delbuono, RN  MD notified (1st page):  N/A. Per orders due not notify MD unless Hgb is less than 6  Time of first page:  N/A  MD notified (2nd page): N/A  Time of second page: N/A  Responding MD:  N/A  Time MD responded:  N/A

## 2015-10-15 NOTE — Discharge Summary (Signed)
Physician Discharge Summary  Patient ID: Shawn Chang MRN: [id number] DOB/AGE: October 16, 1961 54 y.o.  Admit date: 10/08/2015 Discharge date: 10/17/2015  Admission Diagnoses: Class III progressive angina  Discharge Diagnoses:  Active Problems:   Angina pectoris (HCC)   CAD (coronary artery disease)  expected acute blood loss anemia    Patient Active Problem List   Diagnosis Date Noted  . CAD (coronary artery disease) 10/08/2015  . Abnormal nuclear stress test 09/24/2015  . Coronary atherosclerosis due to calcified coronary lesion 09/24/2015  . Angina pectoris (HCC) 09/09/2015  . Essential hypertension 02/15/2015    History of Present Illness: At time of consultation Patient examined,2-D echocardiogram and cardiac catheterization personally reviewed and discussed in cath lab with patient's cardiologist Dr Jacinto Halim  54 year old Caucasian male smoker with strong family history of CAD has had recent symptoms of exertional chest pain and decreased exercise tolerance in early fatigue. A stress test was positive for inferior ischemia. Diagnostic catheterization was performed December 2 showing severe three-vessel coronary disease with preserved LV function. Echocardiogram performed today shows normal biventricular function. The patient was placed on Brillinta and underwent outpatient cath for planned staged PCI  During the procedure at attempted PCI of a heavily diseased mid and distal RCA there was vessel dissection in the PCI was aborted. Patient had transient chest pain but then stabilized and had normal EKG. The plan is to admit the patient, allow washout of the Brillinta and perform multivessel CABG later next week.the patient currently asymptomatic and is planning on starting a heparin drip once the groin sheath is removed.  Discharged Condition: good  Hospital Course: The patient was admitted to the hospital for planned PCI. Due to the RCA dissection this was aborted in cardiothoracic  surgical consultation was obtained with  Dr Donata Clay   who evaluated the patient and his studies and agreed with recommendations to proceed with CABG. He was medically stabilized and underwent a brief Brilenta washout period to lower the risk of intraoperative bleeding. On 10/13/2015  he was taken to the operating room  Where he underwent the procedure described below. He tolerated well and was taken to the surgical intensive care unit in stable condition. Postoperatively he has done well. He has maintained stable hemodynamics, initially requiring some inotropic support, which was weaned without difficulty. His routine lines, monitors and drainage devices have all been discontinued in the standard fashion. He has maintained sinus rhythm.  He did have an expected acute blood loss anemia which did require transfusion. He has also been started on oral iron supplements. He is on Imdur or for his radial artery graft as antispasm prevention. Incisions are noted to be healing well without evidence of infection. The left hand is neurovascularly intact. He is tolerating gradually increasing activities using standard protocols. Status is felt to be tentatively stable for discharge in the next 24-48 hours pending ongoing reevaluation of his recovery.   Consults: cardiothoracic surgery  Significant Diagnostic Studies: angiography: cardiac cath. Also routine post-op labs and CXR's   Treatments: surgery:   DATE OF PROCEDURE: 10/12/2015 DATE OF DISCHARGE:   OPERATIVE REPORT   OPERATION: 1. Coronary artery bypass grafting x5 (left internal mammary artery to  left anterior descending coronary artery, left radial artery free  graft to OM1, sequential saphenous vein graft to OM2 and OM3,  saphenous vein graft to posterior descending). 2. Endoscopic harvest of bilateral lower leg greater saphenous vein.  PREOPERATIVE DIAGNOSES: 1. Class 4 progressive angina. 2. Preoperative  non ST elevation myocardial  infarction following  unsuccessful attempt at percutaneous coronary intervention of the  right coronary artery resulting in proximal dissection of the  vessel. 3. Severe 3-vessel coronary artery disease.  POSTOPERATIVE DIAGNOSES: 1. Class 4 progressive angina. 2. Preoperative non ST elevation myocardial infarction following  unsuccessful attempt at percutaneous coronary intervention of the  right coronary artery resulting in proximal dissection of the  vessel. 3. Severe 3-vessel coronary artery disease.  SURGEON: Kerin PernaPeter Van Trigt, MD  ASSISTANT: Doree Fudgeonielle Zimmerman, PA-C and Rowe ClackWayne E. Gold, PA-C  ANESTHESIA: General by Dr. Gentry RochJudd.  Disposition: 01-Home or Self Care      Discharge Instructions    Amb Referral to Cardiac Rehabilitation    Complete by:  As directed   Diagnosis:  CABG           medications at time of discharge:   Medication List    STOP taking these medications        aspirin 81 MG tablet  Replaced by:  aspirin 325 MG EC tablet     lisinopril-hydrochlorothiazide 20-12.5 MG tablet  Commonly known as:  PRINZIDE,ZESTORETIC     nitroGLYCERIN 0.3 MG SL tablet  Commonly known as:  NITROSTAT     ticagrelor 90 MG Tabs tablet  Commonly known as:  BRILINTA      TAKE these medications        albuterol 108 (90 BASE) MCG/ACT inhaler  Commonly known as:  PROVENTIL HFA;VENTOLIN HFA  Inhale 2 puffs into the lungs every 6 (six) hours as needed for wheezing or shortness of breath.     aspirin 325 MG EC tablet  Take 1 tablet (325 mg total) by mouth daily.     atorvastatin 80 MG tablet  Commonly known as:  LIPITOR  Take 80 mg by mouth daily.     budesonide-formoterol 160-4.5 MCG/ACT inhaler  Commonly known as:  SYMBICORT  Inhale 2 puffs into the lungs 2 (two) times daily.     buPROPion 150 MG 12 hr tablet  Commonly known as:  WELLBUTRIN SR  Take 1 tablet by mouth 2 (two) times daily.     cetirizine 10 MG  tablet  Commonly known as:  ZYRTEC  Take 10 mg by mouth daily.     fentaNYL 100 MCG/HR  Commonly known as:  DURAGESIC - dosed mcg/hr  Place 1 patch (100 mcg total) onto the skin every 3 (three) days.     ferrous fumarate-b12-vitamic C-folic acid capsule  Commonly known as:  TRINSICON / FOLTRIN  Take 1 capsule by mouth 3 (three) times daily after meals.     fluticasone 50 MCG/ACT nasal spray  Commonly known as:  FLONASE  Place 2 sprays into both nostrils daily. Take two sprays in each nostril daily.     furosemide 40 MG tablet  Commonly known as:  LASIX  Take 1 tablet (40 mg total) by mouth daily. For 7 days     HYDROmorphone 2 MG tablet  Commonly known as:  DILAUDID  Take 1 tablet (2 mg total) by mouth every 6 (six) hours as needed for severe pain.     isosorbide mononitrate 30 MG 24 hr tablet  Commonly known as:  IMDUR  Take 0.5 tablets (15 mg total) by mouth daily.     lisinopril 10 MG tablet  Commonly known as:  PRINIVIL,ZESTRIL  Take 1 tablet (10 mg total) by mouth daily.     metoprolol tartrate 25 MG tablet  Commonly known as:  LOPRESSOR  Take 25 mg by mouth  2 (two) times daily.     omeprazole 10 MG capsule  Commonly known as:  PRILOSEC  Take 10 mg by mouth daily.     potassium chloride SA 20 MEQ tablet  Commonly known as:  K-DUR,KLOR-CON  Take 1 tablet (20 mEq total) by mouth daily. For 7 days     traMADol 50 MG tablet  Commonly known as:  ULTRAM  Take 1-2 tablets (50-100 mg total) by mouth every 4 (four) hours as needed for moderate pain.        The patient has been discharged on:   1.Beta Blocker:  Yes [place].Etienne   ]                              No   [   ]                              If No, reason:  2.Ace Inhibitor/ARB: Yes [ y ]                                     No  [    ]                                     If No, reason:  3.Statin:   Yes [ y  ]                  No  [   ]                  If No, reason:  4.Ecasa:  Yes  [ y  ]                  No    [   ]                  If No, reason:    Follow-up Information    Follow up with Yates Decamp, MD.   Specialty:  Cardiology   Why:  please contact office to arrange appointment for PT/INR blood test 2-3 days after discharge to adjust your coumadin dose. Also arrange for 2 week appt to see the doctor(cardiologist)   Contact information:   [address] [place] [address] [phone]       Follow up with Kerin Perna III, MD On 11/10/2015.   Specialty:  Cardiothoracic Surgery   Why:  Appointment is at 11:30, please get CXR 30 min prior to your appointment at [facility] imaging located on 1st floor of [facility]   Contact information:   [address] [place] [address] [phone]       Follow up with [facility] On 10/26/2015.   Specialty:  Cardiothoracic Surgery   Why:  For staple removal, appointment is at 9:30   Contact information:   [address] [place], [address] [place] [address] [phone]     Signed: Lowella Dandy 10/17/2015, 9:31 AM

## 2015-10-15 NOTE — Progress Notes (Signed)
CARDIAC REHAB PHASE I   PRE:  Rate/Rhythm: 89 SR    BP: sitting 106/79    SaO2: 82 RA in bed, up with 3L  MODE:  Ambulation: 550 ft   POST:  Rate/Rhythm: 103 ST    BP: sitting 119/74     SaO2: 86-88 3L, 95 3L with rest  Pt moving fairly well with RW but slightly off balance at times. Needed 3L O2. Assist x2. Declined SOB. Return to bed "I haven't had any rest". Ed completed with pt and wife and friend. Voiced understanding. Very interested in [facility] and will send referral to [place]. Gave video to watch. [id number]  Elissa LovettReeve, Neeka Urista LeesburgKristan CES, ACSM 10/15/2015 2:47 PM

## 2015-10-16 LAB — GLUCOSE, CAPILLARY
GLUCOSE-CAPILLARY: 88 mg/dL (ref 65–99)
GLUCOSE-CAPILLARY: 106 mg/dL — AB (ref 65–99)
GLUCOSE-CAPILLARY: 102 mg/dL — AB (ref 65–99)
Glucose-Capillary: 107 mg/dL — ABNORMAL HIGH (ref 65–99)

## 2015-10-16 LAB — BASIC METABOLIC PANEL
Anion gap: 6 (ref 5–15)
BUN: 22 mg/dL — ABNORMAL HIGH (ref 6–20)
CO2: 28 mmol/L (ref 22–32)
Calcium: 8.1 mg/dL — ABNORMAL LOW (ref 8.9–10.3)
Chloride: 100 mmol/L — ABNORMAL LOW (ref 101–111)
Creatinine, Ser: 1.13 mg/dL (ref 0.61–1.24)
GFR calc Af Amer: >60 mL/min (ref >60)
GFR calc non Af Amer: >60 mL/min (ref >60)
Glucose, Bld: 92 mg/dL (ref 65–99)
Potassium: 4.5 mmol/L (ref 3.5–5.1)
Sodium: 134 mmol/L — ABNORMAL LOW (ref 135–145)

## 2015-10-16 LAB — CBC
HCT: 26.1 % — ABNORMAL LOW (ref 39.0–52.0)
Hemoglobin: 8.5 g/dL — ABNORMAL LOW (ref 13.0–17.0)
MCH: 30.4 pg (ref 26.0–34.0)
MCHC: 32.6 g/dL (ref 30.0–36.0)
MCV: 93.2 fL (ref 78.0–100.0)
Platelets: 138 10*3/uL — ABNORMAL LOW (ref 150–400)
RBC: 2.8 MIL/uL — ABNORMAL LOW (ref 4.22–5.81)
RDW: 14.3 % (ref 11.5–15.5)
WBC: 11.6 10*3/uL — ABNORMAL HIGH (ref 4.0–10.5)

## 2015-10-16 MED ORDER — HYDROMORPHONE HCL 2 MG PO TABS: 2.0000 mg | ORAL_TABLET | Freq: Four times a day (QID) | ORAL | Status: DC | PRN

## 2015-10-16 NOTE — Progress Notes (Addendum)
      [address]       [place] [id number]             [phone]      4 Days Post-Op Procedure(s) (LRB): CORONARY ARTERY BYPASS GRAFTING (CABG) x five , using left internal mammary artery and bilateral legs greater saphenous vein harvested endoscopically (N/A) TRANSESOPHAGEAL ECHOCARDIOGRAM (TEE) (N/A)   Subjective:  [name] is feeling better today.  He does state he got tangled in the covers last night and his right shoulder is a little sore.  He is ambulating   + BM  Objective: Vital signs in last 24 hours: Temp:  [97.9 F (36.6 C)-98.5 F (36.9 C)] 98.5 F (36.9 C) (12/17 0559) Pulse Rate:  [77-91] 91 (12/17 0559) Cardiac Rhythm:  [-] Normal sinus rhythm (12/16 1959) Resp:  [16-20] 16 (12/17 0559) BP: (103-134)/(69-79) 134/78 mmHg (12/17 0559) SpO2:  [88 %-95 %] 91 % (12/17 0559)  Intake/Output from previous day: 12/16 0701 - 12/17 0700 In: 910 [P.O.:240; Blood:670] Out: 450 [Urine:450] Intake/Output this shift:    General appearance: alert, cooperative and no distress Heart: regular rate and rhythm Lungs: diminshed breath sounds bibasilar Abdomen: soft, non-tender; bowel sounds normal; no masses,  no organomegaly Extremities: edema trace, ecchymosis BLE Wound: clean and dry  Lab Results:  Recent Labs  10/15/15 0250 10/16/15 0445  WBC 13.9* 11.6*  HGB 6.8* 8.5*  HCT 20.7* 26.1*  PLT 104* 138*   BMET:  Recent Labs  10/15/15 0250 10/16/15 0445  NA 133* 134*  K 5.0 4.5  CL 99* 100*  CO2 29 28  GLUCOSE 116* 92  BUN 20 22*  CREATININE 1.33* 1.13  CALCIUM 8.3* 8.1*    PT/INR: No results for input(s): LABPROT, INR in the last 72 hours. ABG    Component Value Date/Time   PHART 7.354 10/12/2015 2218   HCO3 25.0* 10/12/2015 2218   TCO2 22 10/13/2015 1728   ACIDBASEDEF 1.0 10/12/2015 2218   O2SAT 98.0 10/12/2015 2218   CBG (last 3)   Recent Labs  10/15/15 1626 10/15/15 2200 10/16/15 0557  GLUCAP 108* 99 88     Assessment/Plan: S/P Procedure(s) (LRB): CORONARY ARTERY BYPASS GRAFTING (CABG) x five , using left internal mammary artery and bilateral legs greater saphenous vein harvested endoscopically (N/A) TRANSESOPHAGEAL ECHOCARDIOGRAM (TEE) (N/A)  1. CV- hemodynamically stable, NSR- continue Lopressor, Lisinopril, Imdur for radial graft 2. Pulm- wean oxygen as tolerated, good use of IS 3. Renal- creatinine 1.13, remains hypervolemic, continue Lasix 4. Expected post operative blood loss anemia- Hgb up to 8.5 after transfusion 5. Dispo- patient stable, d/c EPW today, if remains stable possibly home in Am if off oxygen   LOS: 8 days    Raford PitcherBARRETT, ERIN 10/16/2015  Patient seen and examined, agree with above Home tomorrow if continues to progress  Viviann SpareSteven C. Dorris FetchHendrickson, MD [facility] [phone]\

## 2015-10-16 NOTE — Progress Notes (Signed)
dc'ed pacing wires pt. tolerated well 

## 2015-10-16 NOTE — Progress Notes (Signed)
CARDIAC REHAB PHASE I   PRE:  Rate/Rhythm: 106 SSSST  BP:  Sitting: 144/82        SaO2: 89 RA  MODE:  Ambulation: 490 ft   POST:  Rate/Rhythm: 96 SR  BP:  Sitting: 151/84         SaO2: 92 RA  Pt up ad lib in room. Pt ambulated 490 ft on RA, rolling walker, assist x1, steady gait, tolerated well. Pt denies CP, dizziness, DOE, standing rest x2. Pt sats 89-97% on RA during ambulation, low sat reading likely due to poor circulation in fingers. Reviewed [facility] discharge education with pt wife per her request, answered any questions.  Pt and wife verbalized understanding. Pt to recliner after walk, feet elevated, on RA, call bell within reach, RN at bedside.   [phone] Joylene GrapesMonge, Lennyn Gange C, RN, BSN 10/16/2015 11:42 AM

## 2015-10-17 LAB — GLUCOSE, CAPILLARY: GLUCOSE-CAPILLARY: 91 mg/dL (ref 65–99)

## 2015-10-17 MED ORDER — HYDROMORPHONE HCL 2 MG PO TABS: 2.0000 mg | ORAL_TABLET | Freq: Four times a day (QID) | ORAL | Status: DC | PRN

## 2015-10-17 MED ORDER — TRAMADOL HCL 50 MG PO TABS: 50.0000 mg | ORAL_TABLET | ORAL | Status: DC | PRN

## 2015-10-17 MED ORDER — BUDESONIDE-FORMOTEROL FUMARATE 160-4.5 MCG/ACT IN AERO: 2.0000 | INHALATION_SPRAY | Freq: Two times a day (BID) | RESPIRATORY_TRACT | Status: DC

## 2015-10-17 MED ORDER — FE FUMARATE-B12-VIT C-FA-IFC PO CAPS: 1.0000 | ORAL_CAPSULE | Freq: Three times a day (TID) | ORAL | Status: DC

## 2015-10-17 MED ORDER — ISOSORBIDE MONONITRATE ER 30 MG PO TB24: 15.0000 mg | ORAL_TABLET | Freq: Every day | ORAL | Status: DC

## 2015-10-17 MED ORDER — FUROSEMIDE 40 MG PO TABS
40.0000 mg | ORAL_TABLET | Freq: Every day | ORAL | Status: DC
Start: 2015-10-17 — End: 2015-11-10

## 2015-10-17 MED ORDER — POTASSIUM CHLORIDE CRYS ER 20 MEQ PO TBCR: 20.0000 meq | EXTENDED_RELEASE_TABLET | Freq: Every day | ORAL | Status: DC

## 2015-10-17 MED ORDER — FUROSEMIDE 10 MG/ML IJ SOLN
40.0000 mg | Freq: Once | INTRAMUSCULAR | Status: DC
  Filled 2015-10-17: qty 4

## 2015-10-17 MED ORDER — LISINOPRIL 10 MG PO TABS: 10.0000 mg | ORAL_TABLET | Freq: Every day | ORAL | Status: DC

## 2015-10-17 MED ORDER — ASPIRIN 325 MG PO TBEC: 325.0000 mg | DELAYED_RELEASE_TABLET | Freq: Every day | ORAL | Status: DC

## 2015-10-17 MED ORDER — FENTANYL 100 MCG/HR TD PT72: 100.0000 ug | MEDICATED_PATCH | TRANSDERMAL | Status: DC

## 2015-10-17 NOTE — Progress Notes (Signed)
Discharged to home with family office visits in place teaching done  

## 2015-10-17 NOTE — Progress Notes (Signed)
RN attempted to wean pt off o2. On finger o2 sat monitor reading was 87% on RA; however using forehead RN got 100% on room air. RN educated pt on process of oxygen weaning and pt verbalized understanding.   Will continue to monitor   Shawn RoysMcGrath, [name] R

## 2015-10-17 NOTE — Progress Notes (Addendum)
      [address]       [place] [id number]             [phone]      5 Days Post-Op Procedure(s) (LRB): CORONARY ARTERY BYPASS GRAFTING (CABG) x five , using left internal mammary artery and bilateral legs greater saphenous vein harvested endoscopically (N/A) TRANSESOPHAGEAL ECHOCARDIOGRAM (TEE) (N/A)   Subjective:  [name] has no complaints.  He is ready to go home.  Objective: Vital signs in last 24 hours: Temp:  [97.8 F (36.6 C)-99 F (37.2 C)] 98.4 F (36.9 C) (12/18 0331) Pulse Rate:  [72-93] 93 (12/18 0331) Cardiac Rhythm:  [-] Normal sinus rhythm (12/18 0852) Resp:  [18-20] 18 (12/18 0331) BP: (113-149)/(59-91) 117/91 mmHg (12/18 0331) SpO2:  [96 %-100 %] 100 % (12/18 0331) Weight:  [168 lb 1.6 oz (76.25 kg)] 168 lb 1.6 oz (76.25 kg) (12/18 0331)  Intake/Output from previous day: 12/17 0701 - 12/18 0700 In: 480 [P.O.:480] Out: 600 [Urine:600]  General appearance: alert, cooperative and no distress Heart: regular rate and rhythm Lungs: clear to auscultation bilaterally Abdomen: soft, non-tender; bowel sounds normal; no masses,  no organomegaly Extremities: edema trace Wound: clean and dry  Lab Results:  Recent Labs  10/15/15 0250 10/16/15 0445  WBC 13.9* 11.6*  HGB 6.8* 8.5*  HCT 20.7* 26.1*  PLT 104* 138*   BMET:  Recent Labs  10/15/15 0250 10/16/15 0445  NA 133* 134*  K 5.0 4.5  CL 99* 100*  CO2 29 28  GLUCOSE 116* 92  BUN 20 22*  CREATININE 1.33* 1.13  CALCIUM 8.3* 8.1*    PT/INR: No results for input(s): LABPROT, INR in the last 72 hours. ABG    Component Value Date/Time   PHART 7.354 10/12/2015 2218   HCO3 25.0* 10/12/2015 2218   TCO2 22 10/13/2015 1728   ACIDBASEDEF 1.0 10/12/2015 2218   O2SAT 98.0 10/12/2015 2218   CBG (last 3)   Recent Labs  10/16/15 1622 10/16/15 2058 10/17/15 0608  GLUCAP 102* 107* 91    Assessment/Plan: S/P Procedure(s) (LRB): CORONARY ARTERY BYPASS GRAFTING (CABG) x  five , using left internal mammary artery and bilateral legs greater saphenous vein harvested endoscopically (N/A) TRANSESOPHAGEAL ECHOCARDIOGRAM (TEE) (N/A)  1. CV- hemodynamically stable- continue Lopressor, Lisinopril, Imdur for radial graft 2. Pulm- off oxygen, no acute issues, instructed patient to continue IS at discharge 3. Renal- remains hypervolemic continue lasix 4. Dispo- patient is stable, will d/c home today    LOS: 9 days    BARRETT, ERIN 10/17/2015  patient examined and medical record reviewed,agree with above note. Kathlee Nationseter Van Trigt III 10/17/2015

## 2015-10-18 LAB — TYPE AND SCREEN
ABO/RH(D): A POS
Antibody Screen: NEGATIVE
Unit division: 0
Unit division: 0
Unit division: 0

## 2015-10-22 ENCOUNTER — Emergency Department (HOSPITAL_COMMUNITY)
Admission: EM | Admit: 2015-10-22 | Discharge: 2015-10-23 | Disposition: A | Discharge status: Home / Self Care | Payer: PRIVATE HEALTH INSURANCE | Attending: Emergency Medicine | Admitting: Emergency Medicine

## 2015-10-22 ENCOUNTER — Encounter (HOSPITAL_COMMUNITY): Payer: Self-pay | Admitting: Emergency Medicine

## 2015-10-22 DIAGNOSIS — I252 Old myocardial infarction: Secondary | ICD-10-CM | POA: Insufficient documentation

## 2015-10-22 DIAGNOSIS — I10 Essential (primary) hypertension: Secondary | ICD-10-CM | POA: Insufficient documentation

## 2015-10-22 DIAGNOSIS — Z79899 Other long term (current) drug therapy: Secondary | ICD-10-CM | POA: Insufficient documentation

## 2015-10-22 DIAGNOSIS — K0381 Cracked tooth: Secondary | ICD-10-CM | POA: Insufficient documentation

## 2015-10-22 DIAGNOSIS — K047 Periapical abscess without sinus: Secondary | ICD-10-CM | POA: Insufficient documentation

## 2015-10-22 DIAGNOSIS — Z87891 Personal history of nicotine dependence: Secondary | ICD-10-CM | POA: Diagnosis not present

## 2015-10-22 DIAGNOSIS — Z7982 Long term (current) use of aspirin: Secondary | ICD-10-CM | POA: Diagnosis not present

## 2015-10-22 DIAGNOSIS — K0889 Other specified disorders of teeth and supporting structures: Secondary | ICD-10-CM | POA: Diagnosis present

## 2015-10-22 HISTORY — DX: Acute myocardial infarction, unspecified: I21.9

## 2015-10-22 MED ORDER — OXYCODONE HCL 5 MG PO TABS
5.0000 mg | ORAL_TABLET | Freq: Once | ORAL | Status: AC
  Administered 2015-10-22: 5 mg via ORAL
  Filled 2015-10-22: qty 1

## 2015-10-22 NOTE — ED Notes (Addendum)
C/o L upper toothache that started today.  Pt had CABG on 10/12/15.  States dentist called in antibiotic and he started it tonight.  States he was scheduled for dental work already prior to MI.

## 2015-10-23 MED ORDER — HYDROMORPHONE HCL 1 MG/ML IJ SOLN
2.0000 mg | Freq: Once | INTRAMUSCULAR | Status: AC
  Administered 2015-10-23: 2 mg via INTRAMUSCULAR
  Filled 2015-10-23: qty 2

## 2015-10-23 MED ORDER — ONDANSETRON 4 MG PO TBDP
8.0000 mg | ORAL_TABLET | Freq: Once | ORAL | Status: AC
  Administered 2015-10-23: 8 mg via ORAL
  Filled 2015-10-23: qty 2

## 2015-10-23 MED ORDER — OXYCODONE HCL 5 MG PO TABS: 2.5000 mg | ORAL_TABLET | ORAL | Status: DC | PRN

## 2015-10-23 NOTE — ED Notes (Signed)
Pt reports his pain is still a 10/10 and the oxycodone has not helped relieve any of his pain. Cammy CopaAbigail, PA informed.

## 2015-10-23 NOTE — Discharge Instructions (Signed)
Dental Abscess °A dental abscess is a collection of pus in or around a tooth. °CAUSES °This condition is caused by a bacterial infection around the root of the tooth that involves the inner part of the tooth (pulp). It may result from: °· Severe tooth decay. °· Trauma to the tooth that allows bacteria to enter into the pulp, such as a broken or chipped tooth. °· Severe gum disease around a tooth. °SYMPTOMS °Symptoms of this condition include: °· Severe pain in and around the infected tooth. °· Swelling and redness around the infected tooth, in the mouth, or in the face. °· Tenderness. °· Pus drainage. °· Bad breath. °· Bitter taste in the mouth. °· Difficulty swallowing. °· Difficulty opening the mouth. °· Nausea. °· Vomiting. °· Chills. °· Swollen neck glands. °· Fever. °DIAGNOSIS °This condition is diagnosed with examination of the infected tooth. During the exam, your dentist may tap on the infected tooth. Your dentist will also ask about your medical and dental history and may order X-rays. °TREATMENT °This condition is treated by eliminating the infection. This may be done with: °· Antibiotic medicine. °· A root canal. This may be performed to save the tooth. °· Pulling (extracting) the tooth. This may also involve draining the abscess. This is done if the tooth cannot be saved. °HOME CARE INSTRUCTIONS °· Take medicines only as directed by your dentist. °· If you were prescribed antibiotic medicine, finish all of it even if you start to feel better. °· Rinse your mouth (gargle) often with salt water to relieve pain or swelling. °· Do not drive or operate heavy machinery while taking pain medicine. °· Do not apply heat to the outside of your mouth. °· Keep all follow-up visits as directed by your dentist. This is important. °SEEK MEDICAL CARE IF: °· Your pain is worse and is not helped by medicine. °SEEK IMMEDIATE MEDICAL CARE IF: °· You have a fever or chills. °· Your symptoms suddenly get worse. °· You have a  very bad headache. °· You have problems breathing or swallowing. °· You have trouble opening your mouth. °· You have swelling in your neck or around your eye. °  °This information is not intended to replace advice given to you by your health care provider. Make sure you discuss any questions you have with your health care provider. °  °Document Released: 10/16/2005 Document Revised: 03/02/2015 Document Reviewed: 10/13/2014 °Elsevier Interactive Patient Education ©2016 [employer] ° °Dental Pain °Dental pain may be caused by many things, including: °· Tooth decay (cavities or caries). Cavities expose the nerve of your tooth to air and hot or cold temperatures. This can cause pain or discomfort. °· Abscess or infection. A dental abscess is a collection of infected pus from a bacterial infection in the inner part of the tooth (pulp). It usually occurs at the end of the tooth's root. °· Injury. °· An unknown reason (idiopathic). °Your pain may be mild or severe. It may only occur when: °· You are chewing. °· You are exposed to hot or cold temperature. °· You are eating or drinking sugary foods or beverages, such as soda or candy. °Your pain may also be constant. °HOME CARE INSTRUCTIONS °Watch your dental pain for any changes. The following actions may help to lessen any discomfort that you are feeling: °· Take medicines only as directed by your dentist. °· If you were prescribed an antibiotic medicine, finish all of it even if you start to feel better. °· Keep all   follow-up visits as directed by your dentist. This is important. °· Do not apply heat to the outside of your face. °· Rinse your mouth or gargle with salt water if directed by your dentist. This helps with pain and swelling. °¨ You can make salt water by adding ¼ tsp of salt to 1 cup of warm water. °· Apply ice to the painful area of your face: °¨ Put ice in a plastic bag. °¨ Place a towel between your skin and the bag. °¨ Leave the ice on for 20 minutes,  2-3 times per day. °· Avoid foods or drinks that cause you pain, such as: °¨ Very hot or very cold foods or drinks. °¨ Sweet or sugary foods or drinks. °SEEK MEDICAL CARE IF: °· Your pain is not controlled with medicines. °· Your symptoms are worse. °· You have new symptoms. °SEEK IMMEDIATE MEDICAL CARE IF: °· You are unable to open your mouth. °· You are having trouble breathing or swallowing. °· You have a fever. °· Your face, neck, or jaw is swollen. °  °This information is not intended to replace advice given to you by your health care provider. Make sure you discuss any questions you have with your health care provider. °  °Document Released: 10/16/2005 Document Revised: 03/02/2015 Document Reviewed: 10/12/2014 °Elsevier Interactive Patient Education ©2016 [employer]. ° °

## 2015-10-23 NOTE — ED Provider Notes (Signed)
CSN: [id number]     Arrival date & time 10/22/15  2236 History   First MD Initiated Contact with Patient 10/22/15 2313     Chief Complaint  Patient presents with  . Dental Pain     (Consider location/radiation/quality/duration/timing/severity/associated sxs/prior Treatment) Patient is a 54 y.o. male presenting with tooth pain. The history is provided by the patient and medical records. No language interpreter was used.  Dental Pain Location:  Upper Upper teeth location:  15/LU 2nd molar Quality:  Aching and throbbing Severity:  Severe Onset quality:  Gradual Duration:  1 day Timing:  Constant Progression:  Worsening Chronicity:  New Context: crown fracture   Relieved by:  Nothing Worsened by:  Touching Ineffective treatments:  Acetaminophen Associated symptoms: gum swelling   Associated symptoms: no congestion, no difficulty swallowing, no drooling, no facial pain, no facial swelling, no fever, no headaches, no neck pain, no neck swelling, no oral bleeding, no oral lesions and no trismus   Risk factors: smoking      Shawn Chang Is a 54 year old male who presents emergency Department with chief complaint of dental pain. He has a broken left upper second molar. Today around 1 PM, he developed severe pain in the gum. It radiates into his upper face and sinuses. He called his dentist who prescribed Augmentin. He has been taking Tylenol for pain which has not been effective. He is status post CABG, 5 days ago. He denies fevers, chills, nausea, vomiting. The patient was given Duragesic patches after his surgery, which made him become angry and hallucinate. He has not taken any more fentanyl.  Past Medical History  Diagnosis Date  . Hypertension   . Myocardial infarction [facility])    Past Surgical History  Procedure Laterality Date  . Knee surgery    . Shoulder surgery    . Cardiac catheterization N/A 10/01/2015    Procedure: Left Heart Cath and Coronary Angiography;  Surgeon: Yates Decamp, MD;  Location: [facility] INVASIVE CV LAB;  Service: Cardiovascular;  Laterality: N/A;  . Cardiac catheterization N/A 10/08/2015    Procedure: Coronary Stent Intervention;  Surgeon: Yates Decamp, MD;  Location: [facility] INVASIVE CV LAB;  Service: Cardiovascular;  Laterality: N/A;  . Peripheral vascular catheterization  10/08/2015    Procedure: Abdominal Aortogram;  Surgeon: Yates Decamp, MD;  Location: [facility] INVASIVE CV LAB;  Service: Cardiovascular;;  . Peripheral vascular catheterization  10/08/2015    Procedure: Aortic Arch Angiography;  Surgeon: Yates Decamp, MD;  Location: [facility] INVASIVE CV LAB;  Service: Cardiovascular;;  . Tonsillectomy      Pt has also had sinus and throat surgery  . Coronary artery bypass graft N/A 10/12/2015    Procedure: CORONARY ARTERY BYPASS GRAFTING (CABG) x five , using left internal mammary artery and bilateral legs greater saphenous vein harvested endoscopically;  Surgeon: Kerin Perna, MD;  Location: [facility] OR;  Service: Open Heart Surgery;  Laterality: N/A;  . Tee without cardioversion N/A 10/12/2015    Procedure: TRANSESOPHAGEAL ECHOCARDIOGRAM (TEE);  Surgeon: Kerin Perna, MD;  Location: [facility] OR;  Service: Open Heart Surgery;  Laterality: N/A;  . Coronary artery bypass graft     Family History  Problem Relation Age of Onset  . Heart disease Mother   . Heart disease Father    Social History  Substance Use Topics  . Smoking status: Former Smoker    Types: Cigarettes    Quit date: 10/12/2015  . Smokeless tobacco: None  . Alcohol Use: 0.0 oz/week  0 Standard drinks or equivalent per week    Review of Systems  Constitutional: Negative for fever.  HENT: Negative for congestion, drooling, facial swelling, mouth sores, trouble swallowing and voice change.   Gastrointestinal: Negative for vomiting.  Musculoskeletal: Negative for neck pain.  Neurological: Negative for headaches.      Allergies  Morphine and related  Home Medications   Prior to Admission medications    Medication Sig Start Date End Date Taking? Authorizing Provider  acetaminophen (TYLENOL) 500 MG tablet Take 1,000 mg by mouth every 12 (twelve) hours as needed for mild pain.   Yes Historical Provider, MD  albuterol (PROVENTIL HFA;VENTOLIN HFA) 108 (90 BASE) MCG/ACT inhaler Inhale 2 puffs into the lungs every 6 (six) hours as needed for wheezing or shortness of breath. 09/09/15  Yes Ofilia NeasMichael L Clark, PA-C  amoxicillin-clavulanate (AUGMENTIN) 500-125 MG tablet Take 1 tablet by mouth 2 (two) times daily.   Yes Historical Provider, MD  aspirin EC 325 MG EC tablet Take 1 tablet (325 mg total) by mouth daily. 10/17/15  Yes Erin R Barrett, PA-C  atorvastatin (LIPITOR) 80 MG tablet Take 80 mg by mouth daily.   Yes Historical Provider, MD  budesonide-formoterol (SYMBICORT) 160-4.5 MCG/ACT inhaler Inhale 2 puffs into the lungs 2 (two) times daily. 10/17/15  Yes Erin R Barrett, PA-C  buPROPion (WELLBUTRIN SR) 150 MG 12 hr tablet Take 1 tablet by mouth 2 (two) times daily. 09/21/15  Yes Historical Provider, MD  cetirizine (ZYRTEC) 10 MG tablet Take 10 mg by mouth daily.   Yes Historical Provider, MD  ferrous fumarate-b12-vitamic C-folic acid (TRINSICON / FOLTRIN) capsule Take 1 capsule by mouth 3 (three) times daily after meals. 10/17/15  Yes Erin R Barrett, PA-C  fluticasone (FLONASE) 50 MCG/ACT nasal spray Place 2 sprays into both nostrils daily. Take two sprays in each nostril daily. Patient taking differently: Place 2 sprays into both nostrils daily as needed for allergies. Take two sprays in each nostril daily. 09/09/15  Yes Ofilia NeasMichael L Clark, PA-C  furosemide (LASIX) 40 MG tablet Take 1 tablet (40 mg total) by mouth daily. For 7 days 10/17/15  Yes Erin R Barrett, PA-C  isosorbide mononitrate (IMDUR) 30 MG 24 hr tablet Take 0.5 tablets (15 mg total) by mouth daily. 10/17/15  Yes Erin R Barrett, PA-C  lisinopril (PRINIVIL,ZESTRIL) 10 MG tablet Take 1 tablet (10 mg total) by mouth daily. 10/17/15  Yes Erin R  Barrett, PA-C  metoprolol tartrate (LOPRESSOR) 25 MG tablet Take 25 mg by mouth 2 (two) times daily.   Yes Historical Provider, MD  nitroGLYCERIN (NITROSTAT) 0.3 MG SL tablet Place 0.3 mg under the tongue every 5 (five) minutes as needed for chest pain.   Yes Historical Provider, MD  omeprazole (PRILOSEC) 10 MG capsule Take 10 mg by mouth daily.   Yes Historical Provider, MD  potassium chloride SA (K-DUR,KLOR-CON) 20 MEQ tablet Take 1 tablet (20 mEq total) by mouth daily. For 7 days 10/17/15  Yes Erin R Barrett, PA-C  traMADol (ULTRAM) 50 MG tablet Take 1-2 tablets (50-100 mg total) by mouth every 4 (four) hours as needed for moderate pain. 10/17/15  Yes Erin R Barrett, PA-C  fentaNYL (DURAGESIC - DOSED MCG/HR) 100 MCG/HR Place 1 patch (100 mcg total) onto the skin every 3 (three) days. Patient not taking: Reported on 10/22/2015 10/17/15   Erin R Barrett, PA-C  HYDROmorphone (DILAUDID) 2 MG tablet Take 1 tablet (2 mg total) by mouth every 6 (six) hours as needed for severe pain.  10/17/15   Erin R Barrett, PA-C   BP 133/92 mmHg  Pulse 84  Temp(Src) 98.2 F (36.8 C) (Oral)  Resp 16  SpO2 98% Physical Exam  Constitutional: He appears well-developed and well-nourished. No distress.  HENT:  Head: Normocephalic and atraumatic.  Fractured and decayed tooth down to the gumline of the left upper second molar. There is erythema and swelling of the gingiva just above that tooth. No overt abscess or areas of fluctuance.  Eyes: Conjunctivae are normal. No scleral icterus.  Neck: Normal range of motion. Neck supple.  Cardiovascular: Normal rate, regular rhythm and normal heart sounds.   Pulmonary/Chest: Effort normal and breath sounds normal. No respiratory distress.  Abdominal: Soft. There is no tenderness.  Musculoskeletal: He exhibits no edema.  Neurological: He is alert.  Skin: Skin is warm and dry. He is not diaphoretic.  Psychiatric: His behavior is normal.  Nursing note and vitals  reviewed.   ED Course  Procedures (including critical care time) Labs Review Labs Reviewed - No data to display  Imaging Review No results found. I have personally reviewed and evaluated these images and lab results as part of my medical decision-making.   EKG Interpretation None      MDM   Final diagnoses:  None   12:33 AM Patient has taken one dose of Augmentin tonight. He will be discharged with oxycodone 5 mg. He appears safe for discharge at this time. Patient with toothache.  No gross abscess.  Exam unconcerning for Ludwig's angina or spread of infection.  Will treat with penicillin and pain medicine.  Urged patient to follow-up with dentist.       Arthor Captain, PA-C 10/23/15 0036  Glynn Octave, MD 10/23/15 [phone]

## 2015-10-26 ENCOUNTER — Ambulatory Visit (INDEPENDENT_AMBULATORY_CARE_PROVIDER_SITE_OTHER): Payer: Self-pay | Admitting: *Deleted

## 2015-10-26 DIAGNOSIS — I2510 Atherosclerotic heart disease of native coronary artery without angina pectoris: Secondary | ICD-10-CM

## 2015-10-26 DIAGNOSIS — Z951 Presence of aortocoronary bypass graft: Secondary | ICD-10-CM

## 2015-10-26 NOTE — Progress Notes (Signed)
[name] has returned s/p CABG X 5 for staple removal from his left radial evh lower arm. The sternal incision, two chest tube sites, right and left leg evh sites and bilateral groin sites are all very well healed.  The left lower arm incision is very well healed with an area of non-approximation at the upper end. All staples were removed and steristrips applied.  He relates having a tooth abscess several days after discharge and is presently on Augmentin and is concerned when he can have the dental procedure the dentist has recommended. I said I would consult with Dr. Donata ClayVan Trigt about this and also his inability to sleep. I suggested he try Melatonin or Tylenol PM. He will return as scheduled with a chest xray.  I have encouraged them to call for a f/u appointment with Dr. Nadara EatonGangi.

## 2015-11-08 ENCOUNTER — Other Ambulatory Visit: Payer: Self-pay | Admitting: Cardiothoracic Surgery

## 2015-11-08 DIAGNOSIS — Z951 Presence of aortocoronary bypass graft: Secondary | ICD-10-CM

## 2015-11-10 ENCOUNTER — Encounter: Payer: Self-pay | Admitting: Cardiothoracic Surgery

## 2015-11-10 ENCOUNTER — Ambulatory Visit (INDEPENDENT_AMBULATORY_CARE_PROVIDER_SITE_OTHER): Payer: Self-pay | Admitting: Cardiothoracic Surgery

## 2015-11-10 ENCOUNTER — Ambulatory Visit
Admission: RE | Admit: 2015-11-10 | Discharge: 2015-11-10 | Disposition: A | Discharge status: Home / Self Care | Payer: PRIVATE HEALTH INSURANCE | Source: Ambulatory Visit | Attending: Cardiothoracic Surgery | Admitting: Cardiothoracic Surgery

## 2015-11-10 VITALS — BP 118/72 | HR 88 | Resp 16 | Ht 66.0 in | Wt 168.0 lb

## 2015-11-10 DIAGNOSIS — Z951 Presence of aortocoronary bypass graft: Secondary | ICD-10-CM

## 2015-11-10 DIAGNOSIS — I2510 Atherosclerotic heart disease of native coronary artery without angina pectoris: Secondary | ICD-10-CM

## 2015-11-14 NOTE — Progress Notes (Signed)
PCP is No PCP Per Patient Referring Provider is Yates Decamp, MD  Chief Complaint  Patient presents with  . Routine Post Op    f/u from surgery with CXR s/p  Coronary artery bypass grafting x5 10/12/15, presently on antx for scheduled dental work    HPI: Doing very well after CABG x5 for unstable angina. No recurrent angina or symptoms of heart failure Surgical incisions healing well Patient was placed on postop Plavix by Dr. Jacinto Halim  Past Medical History  Diagnosis Date  . Hypertension   . Myocardial infarction [facility])     Past Surgical History  Procedure Laterality Date  . Knee surgery    . Shoulder surgery    . Cardiac catheterization N/A 10/01/2015    Procedure: Left Heart Cath and Coronary Angiography;  Surgeon: Yates Decamp, MD;  Location: [facility] INVASIVE CV LAB;  Service: Cardiovascular;  Laterality: N/A;  . Cardiac catheterization N/A 10/08/2015    Procedure: Coronary Stent Intervention;  Surgeon: Yates Decamp, MD;  Location: [facility] INVASIVE CV LAB;  Service: Cardiovascular;  Laterality: N/A;  . Peripheral vascular catheterization  10/08/2015    Procedure: Abdominal Aortogram;  Surgeon: Yates Decamp, MD;  Location: [facility] INVASIVE CV LAB;  Service: Cardiovascular;;  . Peripheral vascular catheterization  10/08/2015    Procedure: Aortic Arch Angiography;  Surgeon: Yates Decamp, MD;  Location: [facility] INVASIVE CV LAB;  Service: Cardiovascular;;  . Tonsillectomy      Pt has also had sinus and throat surgery  . Coronary artery bypass graft N/A 10/12/2015    Procedure: CORONARY ARTERY BYPASS GRAFTING (CABG) x five , using left internal mammary artery and bilateral legs greater saphenous vein harvested endoscopically;  Surgeon: Kerin Perna, MD;  Location: [facility] OR;  Service: Open Heart Surgery;  Laterality: N/A;  . Tee without cardioversion N/A 10/12/2015    Procedure: TRANSESOPHAGEAL ECHOCARDIOGRAM (TEE);  Surgeon: Kerin Perna, MD;  Location: [facility] OR;  Service: Open Heart Surgery;  Laterality: N/A;  . Coronary  artery bypass graft      Family History  Problem Relation Age of Onset  . Heart disease Mother   . Heart disease Father     Social History Social History  Substance Use Topics  . Smoking status: Former Smoker    Types: Cigarettes    Quit date: 10/12/2015  . Smokeless tobacco: None  . Alcohol Use: 0.0 oz/week    0 Standard drinks or equivalent per week    Current Outpatient Prescriptions  Medication Sig Dispense Refill  . acetaminophen (TYLENOL) 500 MG tablet Take 1,000 mg by mouth every 12 (twelve) hours as needed for mild pain.    Marland Kitchen amoxicillin-clavulanate (AUGMENTIN) 500-125 MG tablet Take 1 tablet by mouth 2 (two) times daily.    Marland Kitchen aspirin EC 81 MG tablet Take 81 mg by mouth daily.    Marland Kitchen atorvastatin (LIPITOR) 80 MG tablet Take 80 mg by mouth daily.    . budesonide-formoterol (SYMBICORT) 160-4.5 MCG/ACT inhaler Inhale 2 puffs into the lungs 2 (two) times daily. 1 Inhaler 12  . buPROPion (WELLBUTRIN SR) 150 MG 12 hr tablet Take 1 tablet by mouth 2 (two) times daily.  0  . cetirizine (ZYRTEC) 10 MG tablet Take 10 mg by mouth daily.    . clopidogrel (PLAVIX) 75 MG tablet Take 75 mg by mouth daily.    . ferrous fumarate-b12-vitamic C-folic acid (TRINSICON / FOLTRIN) capsule Take 1 capsule by mouth 3 (three) times daily after meals. 90 capsule 0  . isosorbide mononitrate (IMDUR)  30 MG 24 hr tablet Take 0.5 tablets (15 mg total) by mouth daily. 30 tablet 0  . lisinopril (PRINIVIL,ZESTRIL) 10 MG tablet Take 1 tablet (10 mg total) by mouth daily. 30 tablet 3  . metoprolol tartrate (LOPRESSOR) 25 MG tablet Take 25 mg by mouth 2 (two) times daily.    . nitroGLYCERIN (NITROSTAT) 0.3 MG SL tablet Place 0.3 mg under the tongue every 5 (five) minutes as needed for chest pain.    Marland Kitchen. omeprazole (PRILOSEC) 10 MG capsule Take 10 mg by mouth daily.    Marland Kitchen. oxyCODONE (OXY IR/ROXICODONE) 5 MG immediate release tablet Take 0.5-1 tablets (2.5-5 mg total) by mouth every 4 (four) hours as needed for severe  pain. (Patient not taking: Reported on 11/10/2015) 30 tablet 0  . traMADol (ULTRAM) 50 MG tablet Take 1-2 tablets (50-100 mg total) by mouth every 4 (four) hours as needed for moderate pain. (Patient not taking: Reported on 11/10/2015) 30 tablet 0   No current facility-administered medications for this visit.    Allergies  Allergen Reactions  . Morphine And Related Nausea And Vomiting    Review of Systems  Improved energy and strength Ambulating 20 minutes daily Not requiring narcotic pain medication  BP 118/72 mmHg  Pulse 88  Resp 16  Ht 5\' 6"  (1.676 m)  Wt 168 lb (76.204 kg)  BMI 27.13 kg/m2  SpO2 98% Physical Exam Alert and comfortable Lungs clear Surgical incisions are healing well Heart rhythm regular, no murmur No peripheral edema  Diagnostic Tests:  Chest x-rays clear Impression: Excellent early recovery after urgent multivessel CABG  Plan: Continue current medications The patient may lift up to 20 pounds maximum and resume driving The patient return for 4- 6 weeks review of progress  Mikey BussingPeter Van Trigt III, MD [facility] [phone]

## 2015-11-25 ENCOUNTER — Encounter (HOSPITAL_COMMUNITY)
Admission: RE | Admit: 2015-11-25 | Discharge: 2015-11-25 | Disposition: A | Discharge status: Home / Self Care | Payer: PRIVATE HEALTH INSURANCE | Source: Ambulatory Visit | Attending: Cardiology | Admitting: Cardiology

## 2015-11-25 DIAGNOSIS — Z951 Presence of aortocoronary bypass graft: Secondary | ICD-10-CM | POA: Insufficient documentation

## 2015-11-25 NOTE — Progress Notes (Signed)
Cardiac Rehab Medication Review by a Pharmacist  Does the patient  feel that his/her medications are working for him/her?  yes   Has the patient been experiencing any side effects to the medications prescribed?  Yes, trouble sleeping  Does the patient measure his/her own blood pressure or blood glucose at home?  yes   Does the patient have any problems obtaining medications due to transportation or finances?   no  Understanding of regimen: excellent Understanding of indications: good Potential of compliance: excellent    Pharmacist comments: Patient has a good understanding of medications and indications. Has trouble sleeping but to other side effects and no medication questions.   Daimian Sudberry C. Marvis Moeller, PharmD Pharmacy Resident  Pager: [phone] 11/25/2015 8:42 AM

## 2015-11-29 ENCOUNTER — Encounter (HOSPITAL_COMMUNITY)
Admission: RE | Admit: 2015-11-29 | Discharge: 2015-11-29 | Disposition: A | Discharge status: Home / Self Care | Payer: PRIVATE HEALTH INSURANCE | Source: Ambulatory Visit | Attending: Cardiology | Admitting: Cardiology

## 2015-11-29 ENCOUNTER — Encounter (HOSPITAL_COMMUNITY): Payer: Self-pay

## 2015-11-29 ENCOUNTER — Other Ambulatory Visit: Payer: Self-pay | Admitting: *Deleted

## 2015-11-29 ENCOUNTER — Ambulatory Visit (INDEPENDENT_AMBULATORY_CARE_PROVIDER_SITE_OTHER): Payer: Self-pay | Admitting: *Deleted

## 2015-11-29 VITALS — BP 139/84 | HR 93 | Resp 16 | Ht 66.0 in | Wt 155.0 lb

## 2015-11-29 DIAGNOSIS — L03114 Cellulitis of left upper limb: Secondary | ICD-10-CM

## 2015-11-29 DIAGNOSIS — Z951 Presence of aortocoronary bypass graft: Secondary | ICD-10-CM

## 2015-11-29 DIAGNOSIS — I2510 Atherosclerotic heart disease of native coronary artery without angina pectoris: Secondary | ICD-10-CM

## 2015-11-29 MED ORDER — CEPHALEXIN 500 MG PO CAPS: 500.0000 mg | ORAL_CAPSULE | Freq: Three times a day (TID) | ORAL | Status: DC

## 2015-11-29 NOTE — Progress Notes (Signed)
[name] has returned to the office with concerns regarding some redness of his left lower arm radial EVH incision.  He said it has just appeared today.  The redness is actually in the middle of the long incision spreading outward.. There is no drainage at this time. Dr. Donata Clay was here and examined his arm.  He ordered a 7 day course of Keflex 500 mg TID and this was e-prescribed to his pharmacy.  He will return as scheduled.  He started cardiac rehab today, also.

## 2015-11-29 NOTE — Progress Notes (Signed)
Pt started cardiac rehab today.  Pt tolerated light exercise without difficulty. VSS, telemetry-sinus rhythm, asymptomatic.  Medication list reconciled.  Pt verbalized compliance with medications and denies barriers to compliance. Pt asked to bring NTG with him to next rehab visit to verify dosage.  PSYCHOSOCIAL ASSESSMENT:  PHQ-0. Pt exhibits positive coping skills, hopeful outlook with supportive family. No psychosocial needs identified at this time, no psychosocial interventions necessary.   Pt has recently stopped smoking, which he reports has not been difficult for him since he has been taking welbutrin.   Pt enjoys tournament bass fishing, hunting and spending time with family.   Pt cardiac rehab  goal is  to improve his heart healthy living knowledge.  Pt plans to return to work soon, therefore will not be able to participate in cardiac rehab due to working out of town.  Pt encouraged to participate in education classes, nutritional consult and individualized exercising at home  Education to increase ability to achieve these goals.   Pt oriented to exercise equipment and routine.  Understanding verbalized.

## 2015-12-01 ENCOUNTER — Encounter (HOSPITAL_COMMUNITY)
Admission: RE | Admit: 2015-12-01 | Discharge: 2015-12-01 | Disposition: A | Discharge status: Home / Self Care | Payer: PRIVATE HEALTH INSURANCE | Source: Ambulatory Visit | Attending: Cardiology | Admitting: Cardiology

## 2015-12-01 DIAGNOSIS — Z951 Presence of aortocoronary bypass graft: Secondary | ICD-10-CM | POA: Insufficient documentation

## 2015-12-01 NOTE — Progress Notes (Signed)
Shawn Chang 55 y.o. male Nutrition Note Spoke with pt.  Nutrition Survey reviewed with pt. Pt is not following the Therapeutic Lifestyle Changes diet. Pt states "my issue is genetic. Dr. Jacinto Halim said I wouldn't have been able to avoid it." Pt educated re: the role of a healthy diet in the presence of genetic factors that influence heart disease. Pt feels he eats healthy for someone who travels "48 weeks out of the year." Areas for dietary improvement discussed. Pt expressed fair understanding of the information reviewed. Pt aware of nutrition education classes offered. Lab Results  Component Value Date   HGBA1C 5.4 10/11/2015   Wt Readings from Last 3 Encounters:  11/29/15 155 lb (70.308 kg)  11/25/15 154 lb 15.7 oz (70.3 kg)  11/10/15 168 lb (76.204 kg)   Nutrition Diagnosis ? Food-and nutrition-related knowledge deficit related to lack of exposure to information as related to diagnosis of: ? CVD  Nutrition Intervention ? Benefits of adopting Therapeutic Lifestyle Changes discussed when Medficts reviewed. ? Pt to attend the Portion Distortion class ? Pt given handouts for: ? Nutrition I class ? Nutrition II class ? Continue client-centered nutrition education by RD, as part of interdisciplinary care.  Goal(s) ? Pt to eat a variety of non-starchy vegetables. ? Pt to eat more fruit. ? Pt to go easy on high fat cheeses. ? Pt to watch out for sweets and added sugar. ? Pt to make good choices when eating out at restaurants. ? Pt to identify and limit food sources of saturated fat, trans fat, and sodium  Monitor and Evaluate progress toward nutrition goal with team.  Mickle Plumb, M.Ed, RD, LDN, CDE 12/01/2015 10:04 AM

## 2015-12-03 ENCOUNTER — Encounter (HOSPITAL_COMMUNITY)
Admission: RE | Admit: 2015-12-03 | Discharge: 2015-12-03 | Disposition: A | Discharge status: Home / Self Care | Payer: PRIVATE HEALTH INSURANCE | Source: Ambulatory Visit | Attending: Cardiology | Admitting: Cardiology

## 2015-12-03 DIAGNOSIS — Z951 Presence of aortocoronary bypass graft: Secondary | ICD-10-CM | POA: Diagnosis not present

## 2015-12-03 NOTE — Progress Notes (Signed)
Reviewed home exercise guidelines with patient including endpoints, temperature precautions, target heart rate and rate of perceived exertion. Pt plans to walk as his mode of home exercise. Pt travels a lot for work, and also plans to use hotel gyms when available. Pt voices understanding of instructions given. Artist Pais, MS, ACSM CCEP

## 2015-12-06 ENCOUNTER — Encounter (HOSPITAL_COMMUNITY)
Admission: RE | Admit: 2015-12-06 | Discharge: 2015-12-06 | Disposition: A | Discharge status: Home / Self Care | Payer: PRIVATE HEALTH INSURANCE | Source: Ambulatory Visit | Attending: Cardiology | Admitting: Cardiology

## 2015-12-06 DIAGNOSIS — Z951 Presence of aortocoronary bypass graft: Secondary | ICD-10-CM | POA: Diagnosis not present

## 2015-12-07 NOTE — Progress Notes (Signed)
QUALITY OF LIFE SCORE REVIEW  Pt completed Quality of Life survey as a participant in Cardiac Rehab. pt scores are very satisfactory. Pt verbalizes no concerns.  Offered emotional support and reassurance.  Will continue to monitor and intervene as necessary.

## 2015-12-08 ENCOUNTER — Encounter (HOSPITAL_COMMUNITY): Payer: PRIVATE HEALTH INSURANCE

## 2015-12-10 ENCOUNTER — Encounter (HOSPITAL_COMMUNITY): Payer: PRIVATE HEALTH INSURANCE

## 2015-12-13 ENCOUNTER — Encounter (HOSPITAL_COMMUNITY): Payer: PRIVATE HEALTH INSURANCE

## 2015-12-15 ENCOUNTER — Encounter (HOSPITAL_COMMUNITY): Payer: PRIVATE HEALTH INSURANCE

## 2015-12-17 ENCOUNTER — Encounter (HOSPITAL_COMMUNITY): Payer: PRIVATE HEALTH INSURANCE

## 2015-12-20 ENCOUNTER — Encounter (HOSPITAL_COMMUNITY): Payer: PRIVATE HEALTH INSURANCE

## 2015-12-22 ENCOUNTER — Ambulatory Visit (INDEPENDENT_AMBULATORY_CARE_PROVIDER_SITE_OTHER): Payer: Self-pay | Admitting: Cardiothoracic Surgery

## 2015-12-22 ENCOUNTER — Encounter (HOSPITAL_COMMUNITY): Payer: PRIVATE HEALTH INSURANCE

## 2015-12-22 ENCOUNTER — Encounter: Payer: Self-pay | Admitting: Cardiothoracic Surgery

## 2015-12-22 VITALS — BP 147/87 | HR 102 | Resp 16 | Ht 66.0 in | Wt 157.5 lb

## 2015-12-22 DIAGNOSIS — I2510 Atherosclerotic heart disease of native coronary artery without angina pectoris: Secondary | ICD-10-CM

## 2015-12-22 DIAGNOSIS — Z951 Presence of aortocoronary bypass graft: Secondary | ICD-10-CM

## 2015-12-23 NOTE — Progress Notes (Signed)
PCP is No PCP Per Patient Referring Provider is Yates Decamp, MD  Chief Complaint  Patient presents with  . Routine Post Op    6 wk f/u s/p CABG X 5 10/12/15...BTW X 2 WEEKS    UJW:[id number] postop visit after urgent multivessel CABG including left IMA and left radial artery graft Patient doing well without recurrent angina. He has been back to work but not lifting anything more than 20 pounds. His cardiologist is Dr. Jacinto Halim He has no complaints about his sternal incision.   Past Medical History  Diagnosis Date  . Hypertension   . Myocardial infarction [facility])     Past Surgical History  Procedure Laterality Date  . Knee surgery    . Shoulder surgery    . Cardiac catheterization N/A 10/01/2015    Procedure: Left Heart Cath and Coronary Angiography;  Surgeon: Yates Decamp, MD;  Location: [facility] INVASIVE CV LAB;  Service: Cardiovascular;  Laterality: N/A;  . Cardiac catheterization N/A 10/08/2015    Procedure: Coronary Stent Intervention;  Surgeon: Yates Decamp, MD;  Location: [facility] INVASIVE CV LAB;  Service: Cardiovascular;  Laterality: N/A;  . Peripheral vascular catheterization  10/08/2015    Procedure: Abdominal Aortogram;  Surgeon: Yates Decamp, MD;  Location: [facility] INVASIVE CV LAB;  Service: Cardiovascular;;  . Peripheral vascular catheterization  10/08/2015    Procedure: Aortic Arch Angiography;  Surgeon: Yates Decamp, MD;  Location: [facility] INVASIVE CV LAB;  Service: Cardiovascular;;  . Tonsillectomy      Pt has also had sinus and throat surgery  . Coronary artery bypass graft N/A 10/12/2015    Procedure: CORONARY ARTERY BYPASS GRAFTING (CABG) x five , using left internal mammary artery and bilateral legs greater saphenous vein harvested endoscopically;  Surgeon: Kerin Perna, MD;  Location: [facility] OR;  Service: Open Heart Surgery;  Laterality: N/A;  . Tee without cardioversion N/A 10/12/2015    Procedure: TRANSESOPHAGEAL ECHOCARDIOGRAM (TEE);  Surgeon: Kerin Perna, MD;  Location: [facility] OR;  Service: Open Heart  Surgery;  Laterality: N/A;  . Coronary artery bypass graft      Family History  Problem Relation Age of Onset  . Heart disease Mother   . Heart disease Father     Social History Social History  Substance Use Topics  . Smoking status: Former Smoker    Types: Cigarettes    Quit date: 10/12/2015  . Smokeless tobacco: None  . Alcohol Use: 0.0 oz/week    0 Standard drinks or equivalent per week    Current Outpatient Prescriptions  Medication Sig Dispense Refill  . acetaminophen (TYLENOL) 500 MG tablet Take 1,000 mg by mouth every 12 (twelve) hours as needed for mild pain.    Marland Kitchen aspirin EC 81 MG tablet Take 81 mg by mouth daily.    Marland Kitchen atorvastatin (LIPITOR) 80 MG tablet Take 80 mg by mouth daily.    . cetirizine (ZYRTEC) 10 MG tablet Take 10 mg by mouth daily.    . clopidogrel (PLAVIX) 75 MG tablet Take 75 mg by mouth daily.    Marland Kitchen lisinopril (PRINIVIL,ZESTRIL) 10 MG tablet Take 1 tablet (10 mg total) by mouth daily. 30 tablet 3  . metoprolol tartrate (LOPRESSOR) 25 MG tablet Take 25 mg by mouth 2 (two) times daily.    . nitroGLYCERIN (NITROSTAT) 0.3 MG SL tablet Place 0.3 mg under the tongue every 5 (five) minutes as needed for chest pain.    Marland Kitchen omeprazole (PRILOSEC) 10 MG capsule Take 10 mg by mouth daily.  No current facility-administered medications for this visit.    Allergies  Allergen Reactions  . Morphine And Related Nausea And Vomiting    Review of Systems   Improved energy and gaining weight No edema Insomnia is better  BP 147/87 mmHg  Pulse 102  Resp 16  Ht  (1.676 m)  Wt 157 lb 8 oz (71.442 kg)  BMI 25.43 kg/m2  SpO2 97% Physical Exam Alert and comfortable No JVD Lungs clear Sternum well-healed and stable Heart rhythm regular murmur or gallop or rub Left arm incision well-healed, good left hand grip   Diagnostic Tests: Chest x-ray in last visit personally reviewed showing sternal wires intact, normal cardiac silhouette, no pleural  effusion  Impression: To recovery 2 half months after urgent CABG. The patient understands not to lift more than 20 pounds until 3 months after surgery. He is very motivated to follow a heart healthy lifestyle and diet AND WILL BE FOLLOWED BY Dr.g Jacinto Halim regarding his lipids blood pressure and risk factors.  Plan:return as needed.   Mikey Bussing, MD [facility] [phone]

## 2015-12-24 ENCOUNTER — Encounter (HOSPITAL_COMMUNITY): Payer: PRIVATE HEALTH INSURANCE

## 2015-12-27 ENCOUNTER — Encounter (HOSPITAL_COMMUNITY): Payer: PRIVATE HEALTH INSURANCE

## 2015-12-29 ENCOUNTER — Encounter (HOSPITAL_COMMUNITY): Payer: PRIVATE HEALTH INSURANCE

## 2015-12-31 ENCOUNTER — Encounter (HOSPITAL_COMMUNITY): Payer: PRIVATE HEALTH INSURANCE

## 2016-01-03 ENCOUNTER — Encounter (HOSPITAL_COMMUNITY): Payer: PRIVATE HEALTH INSURANCE

## 2016-01-05 ENCOUNTER — Encounter (HOSPITAL_COMMUNITY): Payer: PRIVATE HEALTH INSURANCE

## 2016-01-07 ENCOUNTER — Encounter (HOSPITAL_COMMUNITY): Payer: PRIVATE HEALTH INSURANCE

## 2016-01-10 ENCOUNTER — Encounter (HOSPITAL_COMMUNITY): Payer: PRIVATE HEALTH INSURANCE

## 2016-01-12 ENCOUNTER — Encounter (HOSPITAL_COMMUNITY): Payer: PRIVATE HEALTH INSURANCE

## 2016-01-14 ENCOUNTER — Encounter (HOSPITAL_COMMUNITY): Payer: PRIVATE HEALTH INSURANCE

## 2016-01-17 ENCOUNTER — Encounter (HOSPITAL_COMMUNITY): Payer: PRIVATE HEALTH INSURANCE

## 2016-01-19 ENCOUNTER — Encounter (HOSPITAL_COMMUNITY): Payer: PRIVATE HEALTH INSURANCE

## 2016-01-21 ENCOUNTER — Encounter (HOSPITAL_COMMUNITY): Payer: PRIVATE HEALTH INSURANCE

## 2016-01-24 ENCOUNTER — Encounter (HOSPITAL_COMMUNITY): Payer: PRIVATE HEALTH INSURANCE

## 2016-01-26 ENCOUNTER — Encounter (HOSPITAL_COMMUNITY): Payer: PRIVATE HEALTH INSURANCE

## 2016-01-28 ENCOUNTER — Encounter (HOSPITAL_COMMUNITY): Payer: PRIVATE HEALTH INSURANCE

## 2016-01-31 ENCOUNTER — Encounter (HOSPITAL_COMMUNITY): Payer: PRIVATE HEALTH INSURANCE

## 2016-02-02 ENCOUNTER — Encounter (HOSPITAL_COMMUNITY): Payer: PRIVATE HEALTH INSURANCE

## 2016-02-04 ENCOUNTER — Encounter (HOSPITAL_COMMUNITY): Payer: PRIVATE HEALTH INSURANCE

## 2016-02-07 ENCOUNTER — Encounter (HOSPITAL_COMMUNITY): Payer: PRIVATE HEALTH INSURANCE

## 2016-02-08 ENCOUNTER — Other Ambulatory Visit: Payer: Self-pay

## 2016-02-08 DIAGNOSIS — Z951 Presence of aortocoronary bypass graft: Secondary | ICD-10-CM

## 2016-02-09 ENCOUNTER — Encounter (HOSPITAL_COMMUNITY): Payer: PRIVATE HEALTH INSURANCE

## 2016-02-11 ENCOUNTER — Encounter (HOSPITAL_COMMUNITY): Payer: PRIVATE HEALTH INSURANCE

## 2016-02-14 ENCOUNTER — Encounter (HOSPITAL_COMMUNITY): Payer: PRIVATE HEALTH INSURANCE

## 2016-02-16 ENCOUNTER — Ambulatory Visit
Admission: RE | Admit: 2016-02-16 | Discharge: 2016-02-16 | Disposition: A | Discharge status: Home / Self Care | Payer: PRIVATE HEALTH INSURANCE | Source: Ambulatory Visit | Attending: Cardiothoracic Surgery | Admitting: Cardiothoracic Surgery

## 2016-02-16 ENCOUNTER — Ambulatory Visit (INDEPENDENT_AMBULATORY_CARE_PROVIDER_SITE_OTHER): Payer: PRIVATE HEALTH INSURANCE | Admitting: Cardiothoracic Surgery

## 2016-02-16 ENCOUNTER — Encounter (HOSPITAL_COMMUNITY): Payer: PRIVATE HEALTH INSURANCE

## 2016-02-16 ENCOUNTER — Other Ambulatory Visit: Payer: Self-pay | Admitting: *Deleted

## 2016-02-16 ENCOUNTER — Encounter: Payer: Self-pay | Admitting: Cardiothoracic Surgery

## 2016-02-16 VITALS — BP 167/99 | HR 85 | Resp 16 | Ht 66.0 in | Wt 167.0 lb

## 2016-02-16 DIAGNOSIS — I2510 Atherosclerotic heart disease of native coronary artery without angina pectoris: Secondary | ICD-10-CM

## 2016-02-16 DIAGNOSIS — M9689 Other intraoperative and postprocedural complications and disorders of the musculoskeletal system: Secondary | ICD-10-CM

## 2016-02-16 DIAGNOSIS — Z951 Presence of aortocoronary bypass graft: Secondary | ICD-10-CM

## 2016-02-17 NOTE — Progress Notes (Signed)
PCP is No PCP Per Patient Referring Provider is Yates DecampGanji, Jay, MD  Chief Complaint  Patient presents with  . Routine Post Op    c/o popping in the sternum when coughing or turning over in bed    HPI: Patient presents for evaluation of recent problem with sternal click. The patient states this is mild. It usually happens when he wakes up in bed and rolled to one side. It also occurs if he has a cough. He is trying to keep his chest splinted when he feels a cough, on. He states that this is very intermittent and not increasing. The sternal incision is healed without drainage or redness. He denies angina.  Past Medical History  Diagnosis Date  . Hypertension   . Myocardial infarction [facility](HCC)     Past Surgical History  Procedure Laterality Date  . Knee surgery    . Shoulder surgery    . Cardiac catheterization N/A 10/01/2015    Procedure: Left Heart Cath and Coronary Angiography;  Surgeon: Yates DecampJay Ganji, MD;  [facility];  Service: Cardiovascular;  Laterality: N/A;  . Cardiac catheterization N/A 10/08/2015    Procedure: Coronary Stent Intervention;  Surgeon: Yates DecampJay Ganji, MD;  [facility];  Service: Cardiovascular;  Laterality: N/A;  . Peripheral vascular catheterization  10/08/2015    Procedure: Abdominal Aortogram;  Surgeon: Yates DecampJay Ganji, MD;  [facility];  Service: Cardiovascular;;  . Peripheral vascular catheterization  10/08/2015    Procedure: Aortic Arch Angiography;  Surgeon: Yates DecampJay Ganji, MD;  [facility];  Service: Cardiovascular;;  . Tonsillectomy      Pt has also had sinus and throat surgery  . Coronary artery bypass graft N/A 10/12/2015    Procedure: CORONARY ARTERY BYPASS GRAFTING (CABG) x five , using left internal mammary artery and bilateral legs greater saphenous vein harvested endoscopically;  Surgeon: Kerin PernaPeter Van Trigt, MD;  [facility];  Service: Open Heart Surgery;  Laterality: N/A;  . Tee without cardioversion N/A  10/12/2015    Procedure: TRANSESOPHAGEAL ECHOCARDIOGRAM (TEE);  Surgeon: Kerin PernaPeter Van Trigt, MD;  [facility];  Service: Open Heart Surgery;  Laterality: N/A;  . Coronary artery bypass graft      Family History  Problem Relation Age of Onset  . Heart disease Mother   . Heart disease Father     Social History Social History  Substance Use Topics  . Smoking status: Former Smoker    Types: Cigarettes    Quit date: 10/12/2015  . Smokeless tobacco: None  . Alcohol Use: 0.0 oz/week    0 Standard drinks or equivalent per week    Current Outpatient Prescriptions  Medication Sig Dispense Refill  . acetaminophen (TYLENOL) 500 MG tablet Take 1,000 mg by mouth every 12 (twelve) hours as needed for mild pain.    Marland Kitchen. aspirin EC 81 MG tablet Take 81 mg by mouth daily.    Marland Kitchen. atorvastatin (LIPITOR) 80 MG tablet Take 80 mg by mouth daily.    . cetirizine (ZYRTEC) 10 MG tablet Take 10 mg by mouth daily.    . clopidogrel (PLAVIX) 75 MG tablet Take 75 mg by mouth daily.    . nitroGLYCERIN (NITROSTAT) 0.3 MG SL tablet Place 0.3 mg under the tongue every 5 (five) minutes as needed for chest pain.    Marland Kitchen. omeprazole (PRILOSEC) 10 MG capsule Take 10 mg by mouth daily.    Marland Kitchen. lisinopril (PRINIVIL,ZESTRIL) 10 MG tablet Take 1 tablet (10 mg  total) by mouth daily. (Patient not taking: Reported on 02/16/2016) 30 tablet 3  . metoprolol tartrate (LOPRESSOR) 25 MG tablet Take 25 mg by mouth 2 (two) times daily. Reported on 02/16/2016     No current facility-administered medications for this visit.    Allergies  Allergen Reactions  . Morphine And Related Nausea And Vomiting    Review of Systems  No fever or weight loss No shortness of breath  BP 167/99 mmHg  Pulse 85  Resp 16  Ht  (1.676 m)  Wt 167 lb (75.751 kg)  BMI 26.97 kg/m2  SpO2 96% Physical Exam Alert and comfortable Lungs clear Sternum feels stable Heart rate regular sinus rhythm No peripheral edema  Diagnostic Tests: Chest x-ray  performed today personally reviewed This shows good alignment of sternal wires, no broken wires  Impression: Possible sternal separation. 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 Avoid upper extremity motion to put stress on sternal closure Splint sternal incision with coughing or movement   Mikey Bussing, MD [facility] [phone]

## 2016-02-18 ENCOUNTER — Ambulatory Visit (INDEPENDENT_AMBULATORY_CARE_PROVIDER_SITE_OTHER): Payer: Managed Care, Other (non HMO) | Admitting: Physician Assistant

## 2016-02-18 ENCOUNTER — Encounter (HOSPITAL_COMMUNITY): Payer: PRIVATE HEALTH INSURANCE

## 2016-02-18 VITALS — BP 144/88 | HR 78 | Temp 98.0°F | Resp 18 | Ht 65.0 in | Wt 167.0 lb

## 2016-02-18 DIAGNOSIS — R062 Wheezing: Secondary | ICD-10-CM

## 2016-02-18 MED ORDER — PREDNISONE 20 MG PO TABS: 40.0000 mg | ORAL_TABLET | Freq: Every day | ORAL | Status: DC

## 2016-02-18 MED ORDER — IPRATROPIUM BROMIDE 0.02 % IN SOLN
0.5000 mg | Freq: Once | RESPIRATORY_TRACT | Status: AC
  Administered 2016-02-18: 0.5 mg via RESPIRATORY_TRACT

## 2016-02-18 MED ORDER — ALBUTEROL SULFATE (2.5 MG/3ML) 0.083% IN NEBU
2.5000 mg | INHALATION_SOLUTION | Freq: Once | RESPIRATORY_TRACT | Status: AC
  Administered 2016-02-18: 2.5 mg via RESPIRATORY_TRACT

## 2016-02-18 MED ORDER — ALBUTEROL SULFATE HFA 108 (90 BASE) MCG/ACT IN AERS: 2.0000 | INHALATION_SPRAY | Freq: Four times a day (QID) | RESPIRATORY_TRACT | Status: DC | PRN

## 2016-02-18 NOTE — Progress Notes (Signed)
02/18/2016 11:57 AM   DOB: 07/04/1961 / MRN: [id number]  SUBJECTIVE:  Shawn Chang is a 55 y.o. male presenting for cough that has been present for about one month.  Reports he can hear himself wheezing at night and cough is also worse at night.  He is having difficulty sleeping due to the cough.  He is recently s/p 6 vessel CABG and is feeling much better now since last I saw him.  He has no history of asthma, but has been smoking since he was [age] and has recently stopped.  Most recent chest rad is normal.  He denies GERD like symptoms and has been seeing his cardiothoracic surgeon who is concerned he may have aggreavated the sternotomy due to cough.  Chest rads at that time were negative for cardiopulmonary disease.   He denies posterior calf pain and pain with ambulation.  He denies SOB and DOE.     He is allergic to morphine and related.   He  has a past medical history of Hypertension and Myocardial infarction (HCC).    He  reports that he quit smoking about 4 months ago. His smoking use included Cigarettes. He does not have any smokeless tobacco history on file. He reports that he drinks alcohol. He reports that he does not use illicit drugs. He  has no sexual activity history on file. The patient  has past surgical history that includes Knee surgery; shoulder surgery; Cardiac catheterization (N/A, 10/01/2015); Cardiac catheterization (N/A, 10/08/2015); Cardiac catheterization (10/08/2015); Cardiac catheterization (10/08/2015); Tonsillectomy; Coronary artery bypass graft (N/A, 10/12/2015); TEE without cardioversion (N/A, 10/12/2015); and Coronary artery bypass graft.  His family history includes Heart disease in his father and mother.  Review of Systems  Constitutional: Negative for fever and chills.  Eyes: Negative for blurred vision.  Respiratory: Positive for cough and wheezing. Negative for sputum production and shortness of breath.   Cardiovascular: Negative for chest pain.    Gastrointestinal: Negative for nausea and abdominal pain.  Genitourinary: Negative for dysuria, urgency and frequency.  Musculoskeletal: Negative for myalgias.  Skin: Negative for rash.  Neurological: Negative for dizziness, tingling and headaches.  Psychiatric/Behavioral: Negative for depression. The patient is not nervous/anxious.     Problem list and medications reviewed and updated by myself where necessary, and exist elsewhere in the encounter.   OBJECTIVE:  BP 144/88 mmHg  Pulse 78  Temp(Src) 98 F (36.7 C) (Oral)  Resp 18  Ht 5\' 5"  (1.651 m)  Wt 167 lb (75.751 kg)  BMI 27.79 kg/m2  SpO2 98%  PF 400 L/min   Post neb PF 560.    Physical Exam  Constitutional: He is oriented to person, place, and time. He appears well-developed and well-nourished. He does not appear ill.  Eyes: Conjunctivae and EOM are normal. Pupils are equal, round, and reactive to light.  Cardiovascular: Normal rate.   Pulmonary/Chest: Effort normal. He has wheezes (resolved post neb.  See peak flows.).  Abdominal: He exhibits no distension.  Musculoskeletal: Normal range of motion.  Neurological: He is alert and oriented to person, place, and time. No cranial nerve deficit. Coordination normal.  Skin: Skin is warm and dry. He is not diaphoretic.  Psychiatric: He has a normal mood and affect.  Nursing note and vitals reviewed.   No results found for this or any previous visit (from the past 72 hour(s)).  No results found.  ASSESSMENT AND PLAN  Shawn Chang was seen today for follow-up.  Diagnoses and all orders for this  visit:  Wheezing: His symptoms resolved with duonebs.  He has such a long history of smoking I would be surprised if he did not have COPD.  Given he did so well with just duonebs I will start him on Albuterol inhaled q6 as needed only. He will start 40 of pred for 5 days if he is not getting better.  Will avoid long acting beta agonist given his history of CAD.  If this problem is not  controlled with this plan will bring him back in for breathing studies and will likely start him on IHCS.   -     albuterol (PROVENTIL) (2.5 MG/3ML) 0.083% nebulizer solution 2.5 mg; Take 3 mLs (2.5 mg total) by nebulization once. -     ipratropium (ATROVENT) nebulizer solution 0.5 mg; Take 2.5 mLs (0.5 mg total) by nebulization once. -     albuterol (PROVENTIL HFA;VENTOLIN HFA) 108 (90 Base) MCG/ACT inhaler; Inhale 2 puffs into the lungs every 6 (six) hours as needed for wheezing or shortness of breath.  Other orders -     predniSONE (DELTASONE) 20 MG tablet; Take 2 tablets (40 mg total) by mouth daily with breakfast.    The patient was advised to call or return to clinic if he does not see an improvement in symptoms or to seek the care of the closest emergency department if he worsens with the above plan.   Deliah Boston, MHS, PA-C [facility] 02/18/2016 11:57 AM

## 2016-02-18 NOTE — Patient Instructions (Signed)
     IF you received an x-ray today, you will receive an invoice from [facility]. Please contact [facility] at [phone] with questions or concerns regarding your invoice.   IF you received labwork today, you will receive an invoice from [facility]. Please contact [facility] at [phone] with questions or concerns regarding your invoice.   Our billing staff will not be able to assist you with questions regarding bills from these companies.  You will be contacted with the lab results as soon as they are available. The fastest way to get your results is to activate your My Chart account. Instructions are located on the last page of this paperwork. If you have not heard from us regarding the results in 2 weeks, please contact this office.      

## 2016-02-21 ENCOUNTER — Encounter (HOSPITAL_COMMUNITY): Payer: PRIVATE HEALTH INSURANCE

## 2016-02-23 ENCOUNTER — Encounter (HOSPITAL_COMMUNITY): Payer: PRIVATE HEALTH INSURANCE

## 2016-02-25 ENCOUNTER — Encounter (HOSPITAL_COMMUNITY): Payer: PRIVATE HEALTH INSURANCE

## 2016-02-28 ENCOUNTER — Encounter (HOSPITAL_COMMUNITY): Payer: PRIVATE HEALTH INSURANCE

## 2016-03-01 ENCOUNTER — Encounter (HOSPITAL_COMMUNITY): Payer: PRIVATE HEALTH INSURANCE

## 2016-03-03 ENCOUNTER — Encounter (HOSPITAL_COMMUNITY): Payer: PRIVATE HEALTH INSURANCE

## 2016-03-03 ENCOUNTER — Encounter: Payer: 59 | Admitting: Cardiothoracic Surgery

## 2016-03-03 ENCOUNTER — Ambulatory Visit
Admission: RE | Admit: 2016-03-03 | Discharge: 2016-03-03 | Disposition: A | Discharge status: Home / Self Care | Payer: 59 | Source: Ambulatory Visit | Attending: Cardiothoracic Surgery | Admitting: Cardiothoracic Surgery

## 2016-03-03 DIAGNOSIS — M9689 Other intraoperative and postprocedural complications and disorders of the musculoskeletal system: Secondary | ICD-10-CM

## 2016-03-03 MED ORDER — IOPAMIDOL (ISOVUE-300) INJECTION 61%
75.0000 mL | Freq: Once | INTRAVENOUS | Status: AC | PRN
  Administered 2016-03-03: 75 mL via INTRAVENOUS

## 2016-04-12 ENCOUNTER — Telehealth: Payer: Self-pay

## 2016-04-12 NOTE — Telephone Encounter (Signed)
LEFT DETAILED MESSAGE PER Mikes note

## 2016-04-12 NOTE — Telephone Encounter (Signed)
Spoke with patient advised to go to urgent care in [place].  Patient understood

## 2016-04-12 NOTE — Telephone Encounter (Signed)
Pt saw michael clark in April for wheezing and given a inhaler -pt is out of town but the wheezing and cough has come back and he would like to know if something can be done since he is out of town  [employer] [phone]

## 2016-04-12 NOTE — Telephone Encounter (Signed)
His options are to be seen at a local urgent care, as his symptoms resolved with one duo neb last time I saw him.  If he still has the prednisone (printed prescription) I gave him his symptoms should respond to that.  Aside from the inhaler and steroids there is really not much more that I can do for him without seeing him.  All in all, he has a complicated health history so I would encourage him to be seen.

## 2016-06-26 ENCOUNTER — Ambulatory Visit (INDEPENDENT_AMBULATORY_CARE_PROVIDER_SITE_OTHER): Payer: 59 | Admitting: Pulmonary Disease

## 2016-06-26 ENCOUNTER — Encounter: Payer: Self-pay | Admitting: Pulmonary Disease

## 2016-06-26 ENCOUNTER — Other Ambulatory Visit (INDEPENDENT_AMBULATORY_CARE_PROVIDER_SITE_OTHER): Payer: 59

## 2016-06-26 VITALS — BP 122/74 | HR 62 | Ht 66.0 in | Wt 170.2 lb

## 2016-06-26 DIAGNOSIS — R062 Wheezing: Secondary | ICD-10-CM

## 2016-06-26 DIAGNOSIS — R05 Cough: Secondary | ICD-10-CM

## 2016-06-26 DIAGNOSIS — R059 Cough, unspecified: Secondary | ICD-10-CM

## 2016-06-26 LAB — CBC WITH DIFFERENTIAL/PLATELET
Basophils Absolute: 0.1 10*3/uL (ref 0.0–0.1)
Basophils Relative: 0.6 % (ref 0.0–3.0)
EOS ABS: 0.8 10*3/uL — AB (ref 0.0–0.7)
Eosinophils Relative: 8.3 % — ABNORMAL HIGH (ref 0.0–5.0)
HCT: 46.6 % (ref 39.0–52.0)
HEMOGLOBIN: 16 g/dL (ref 13.0–17.0)
LYMPHS PCT: 26.6 % (ref 12.0–46.0)
Lymphs Abs: 2.6 10*3/uL (ref 0.7–4.0)
MCHC: 34.3 g/dL (ref 30.0–36.0)
MCV: 89.1 fl (ref 78.0–100.0)
MONO ABS: 1 10*3/uL (ref 0.1–1.0)
Monocytes Relative: 10 % (ref 3.0–12.0)
Neutro Abs: 5.4 10*3/uL (ref 1.4–7.7)
Neutrophils Relative %: 54.5 % (ref 43.0–77.0)
Platelets: 206 10*3/uL (ref 150.0–400.0)
RBC: 5.24 Mil/uL (ref 4.22–5.81)
RDW: 13.2 % (ref 11.5–15.5)
WBC: 9.9 10*3/uL (ref 4.0–10.5)

## 2016-06-26 NOTE — Progress Notes (Signed)
Shawn Chang    [id number]    04/26/1961  Primary Care Physician:No PCP Per Patient  Referring Physician: Yates Decamp, MD [address] [place], [place] [address]  Chief complaint: Consult for evaluation of recurrent bronchitis.  HPI: [name] is a 55 year old with past medical history of CABG 2013, coronary artery disease. He has been referred by Dr. Jacinto Chang for evaluation of recurrent bronchitis. He's had 2 exacerbations and had to go on a prednisone taper in April and June of this year. He has daily symptoms of cough, wheeze. He has significant allergies and sinus problems. He had seen an allergist several years in the past and had skin testing which demonstrated sensitivity to pet dander, dust.  Outpatient Encounter Prescriptions as of 06/26/2016  Medication Sig  . acetaminophen (TYLENOL) 500 MG tablet Take 1,000 mg by mouth every 12 (twelve) hours as needed for mild pain.  Marland Kitchen albuterol (PROVENTIL HFA;VENTOLIN HFA) 108 (90 Base) MCG/ACT inhaler Inhale 2 puffs into the lungs every 6 (six) hours as needed for wheezing or shortness of breath.  Marland Kitchen aspirin EC 81 MG tablet Take 81 mg by mouth daily.  Marland Kitchen atorvastatin (LIPITOR) 80 MG tablet Take 80 mg by mouth daily.  Marland Kitchen buPROPion (WELLBUTRIN XL) 150 MG 24 hr tablet Take 150 mg by mouth daily.  . cetirizine (ZYRTEC) 10 MG tablet Take 10 mg by mouth daily.  . clopidogrel (PLAVIX) 75 MG tablet Take 75 mg by mouth daily.  Marland Kitchen losartan (COZAAR) 50 MG tablet Take 50 mg by mouth daily.  . metoprolol tartrate (LOPRESSOR) 25 MG tablet Take 25 mg by mouth 2 (two) times daily. Reported on 02/16/2016  . nitroGLYCERIN (NITROSTAT) 0.3 MG SL tablet Place 0.3 mg under the tongue every 5 (five) minutes as needed for chest pain.  Marland Kitchen omeprazole (PRILOSEC) 10 MG capsule Take 10 mg by mouth daily.  . [DISCONTINUED] lisinopril (PRINIVIL,ZESTRIL) 10 MG tablet Take 1 tablet (10 mg total) by mouth daily.  . [DISCONTINUED] predniSONE (DELTASONE) 20 MG  tablet Take 2 tablets (40 mg total) by mouth daily with breakfast. (Patient not taking: Reported on 06/26/2016)   No facility-administered encounter medications on file as of 06/26/2016.     Allergies as of 06/26/2016 - Review Complete 06/26/2016  Allergen Reaction Noted  . Morphine and related Nausea And Vomiting 10/13/2015    Past Medical History:  Diagnosis Date  . Hypertension   . Myocardial infarction [facility])     Past Surgical History:  Procedure Laterality Date  . CARDIAC CATHETERIZATION N/A 10/01/2015   Procedure: Left Heart Cath and Coronary Angiography;  Surgeon: Shawn Decamp, MD;  Location: [facility] INVASIVE CV LAB;  Service: Cardiovascular;  Laterality: N/A;  . CARDIAC CATHETERIZATION N/A 10/08/2015   Procedure: Coronary Stent Intervention;  Surgeon: Shawn Decamp, MD;  Location: [facility] INVASIVE CV LAB;  Service: Cardiovascular;  Laterality: N/A;  . CORONARY ARTERY BYPASS GRAFT N/A 10/12/2015   Procedure: CORONARY ARTERY BYPASS GRAFTING (CABG) x five , using left internal mammary artery and bilateral legs greater saphenous vein harvested endoscopically;  Surgeon: Shawn Perna, MD;  Location: [facility] OR;  Service: Open Heart Surgery;  Laterality: N/A;  . CORONARY ARTERY BYPASS GRAFT    . KNEE SURGERY    . PERIPHERAL VASCULAR CATHETERIZATION  10/08/2015   Procedure: Abdominal Aortogram;  Surgeon: Shawn Decamp, MD;  Location: [facility] INVASIVE CV LAB;  Service: Cardiovascular;;  . PERIPHERAL VASCULAR CATHETERIZATION  10/08/2015   Procedure: Aortic Arch  Angiography;  Surgeon: Shawn Decamp, MD;  Location: [facility] INVASIVE CV LAB;  Service: Cardiovascular;;  . shoulder surgery    . TEE WITHOUT CARDIOVERSION N/A 10/12/2015   Procedure: TRANSESOPHAGEAL ECHOCARDIOGRAM (TEE);  Surgeon: Shawn Perna, MD;  Location: [facility] OR;  Service: Open Heart Surgery;  Laterality: N/A;  . TONSILLECTOMY     Pt has also had sinus and throat surgery    Family History  Problem Relation Age of Onset  . Heart disease Mother     May 2017    . Heart disease Father   . Hypertension Sister   . Stroke Sister   . Heart attack Brother     massive, survived  . Heart attack Brother     Social History   Social History  . Marital status: Married    Spouse name: N/A  . Number of children: N/A  . Years of education: N/A   Occupational History  . Not on file.   Social History Main Topics  . Smoking status: Former Smoker    Packs/day: 1.50    Years: 39.00    Types: Cigarettes    Quit date: 08/31/2015  . Smokeless tobacco: Never Used  . Alcohol use 0.0 oz/week     Comment: 1-3 beers per week  . Drug use: No  . Sexual activity: Not on file   Other Topics Concern  . 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  HENT: Negative.   Eyes: Negative for blurred vision.  Respiratory: as per HPI  Cardiovascular: Negative for chest pain and palpitations.  Gastrointestinal: Negative for vomiting, diarrhea, blood per rectum. Genitourinary: Negative for dysuria, urgency, frequency and hematuria.  Musculoskeletal: Negative for myalgias, back pain and joint pain.  Skin: Negative for itching and rash.  Neurological: Negative for dizziness, tremors, focal weakness, seizures and loss of consciousness.  Endo/Heme/Allergies: Negative for environmental allergies.  Psychiatric/Behavioral: Negative for depression, suicidal ideas and hallucinations.  All other systems reviewed and are negative.   Physical Exam: Blood pressure 122/74, pulse 62, height 5\' 6"  (1.676 m), weight 170 lb 3.2 oz (77.2 kg), SpO2 97 %. Gen:      No acute distress HEENT:  EOMI, sclera anicteric Neck:     No masses; no thyromegaly Lungs:    Clear to auscultation bilaterally; normal respiratory effort CV:         Regular rate and rhythm; no  murmurs Abd:      + bowel sounds; soft, non-tender; no palpable masses, no distension Ext:    No edema; adequate peripheral perfusion Skin:      Warm and dry; no rash Neuro: alert and oriented x 3 Psych: normal mood and affect  Data Reviewed: CBC 06/16/16- Absolute eosinophil count- 800/microL  Assessment:  Mr. Saintjean likely has asthma based on his symptoms and history of atopic allergies. He's been on several prednisone tapers recently with daily symptoms of cough, wheezing. Recent labs from the cardiology office noted with elevated eosinophils.   He will need to be on a long term controller medication. I'll start him on Qvar inhaler and continue the albuterol when necessary. I'll repeat a CBC with differential and send a blood allergy panel, check IgE levels. He'll be scheduled for pulmonary function tests. He'll return  to clinic in 3 months to evaluate response to therapy and review the labs and tests. He may be a candidate for biologic therapy such as Xolair or anti IL5 if his symptoms are not controlled on the inhalers.  Plan/Recommendations: - Start Qvar inhaler - Continue albuterol rescue inhaler - Check CBC with diff, blood allergy panel. - Pulmonary function tests.  Chilton GreathousePraveen Dominigue Gellner MD [facility] Pager [phone] 06/26/2016, 12:44 PM  CC: Shawn DecampGanji, Jay, MD

## 2016-06-26 NOTE — Patient Instructions (Signed)
You will be started on a Qvar inhaler twice a day. Continue using the albuterol inhaler as needed and before exercise. We'll schedule you for a CBC with differential, blood allergy panel. Return to clinic in 3 months with PFTs.

## 2016-06-27 LAB — RESPIRATORY ALLERGY PROFILE REGION II ~~LOC~~
Allergen, C. Herbarum, M2: <0.1 kU/L
Allergen, Cottonwood, t14: <0.1 kU/L
Allergen, D pternoyssinus,d7: <0.1 kU/L
Allergen, Oak,t7: <0.1 kU/L
Bermuda Grass: <0.1 kU/L
Box Elder IgE: <0.1 kU/L
Cat Dander: <0.1 kU/L
D. farinae: <0.1 kU/L
Dog Dander: 0.13 kU/L — ABNORMAL HIGH
Elm IgE: <0.1 kU/L
IGE (IMMUNOGLOBULIN E), SERUM: 18 kU/L (ref <115)
Johnson Grass: <0.1 kU/L
Pecan/Hickory Tree IgE: <0.1 kU/L
Rough Pigweed  IgE: <0.1 kU/L
Timothy Grass: <0.1 kU/L

## 2016-06-27 MED ORDER — BECLOMETHASONE DIPROPIONATE 80 MCG/ACT IN AERS: 2.0000 | INHALATION_SPRAY | Freq: Two times a day (BID) | RESPIRATORY_TRACT | 0 refills | Status: DC

## 2016-06-27 MED ORDER — BECLOMETHASONE DIPROPIONATE 80 MCG/ACT IN AERS
2.0000 | INHALATION_SPRAY | Freq: Two times a day (BID) | RESPIRATORY_TRACT | 5 refills | Status: DC
Start: 2016-06-27 — End: 2016-11-18

## 2016-06-27 MED ORDER — BECLOMETHASONE DIPROPIONATE 40 MCG/ACT IN AERS: 2.0000 | INHALATION_SPRAY | Freq: Two times a day (BID) | RESPIRATORY_TRACT | 0 refills | Status: DC

## 2016-06-27 MED ORDER — BECLOMETHASONE DIPROPIONATE 40 MCG/ACT IN AERS: 2.0000 | INHALATION_SPRAY | Freq: Two times a day (BID) | RESPIRATORY_TRACT | 5 refills | Status: DC

## 2016-06-29 ENCOUNTER — Other Ambulatory Visit: Payer: Self-pay | Admitting: Physician Assistant

## 2016-06-29 DIAGNOSIS — R062 Wheezing: Secondary | ICD-10-CM

## 2016-07-07 ENCOUNTER — Telehealth: Payer: Self-pay | Admitting: Pulmonary Disease

## 2016-07-07 NOTE — Telephone Encounter (Signed)
Chilton GreathousePraveen Mannam, MD  Lowell BoutonJessica E Jones, CMA        Please let the patient know that his lab tests confirm that he has allergies to pet dander. Please continue the qvar inhaler. We will review his response when he returns to clinic.   ---  LMTCB x1

## 2016-07-10 NOTE — Telephone Encounter (Signed)
lmtcb x2 for pt. 

## 2016-07-11 NOTE — Telephone Encounter (Signed)
lmtcb x3 for pt. 

## 2016-07-12 NOTE — Telephone Encounter (Signed)
lmtcb x4 for pt.  

## 2016-07-13 NOTE — Telephone Encounter (Signed)
Attempted to call pt x 5 times.  Will close this message per protocol.

## 2016-07-14 ENCOUNTER — Encounter: Payer: Self-pay | Admitting: Pulmonary Disease

## 2016-07-18 ENCOUNTER — Telehealth: Payer: Self-pay | Admitting: Pulmonary Disease

## 2016-07-18 NOTE — Telephone Encounter (Signed)
Spoke with pt and gave lab results. Pt voiced understanding and will continue current medications as directed. Nothing further needed.

## 2016-09-28 ENCOUNTER — Other Ambulatory Visit: Payer: Self-pay | Admitting: Pulmonary Disease

## 2016-09-28 DIAGNOSIS — R0600 Dyspnea, unspecified: Secondary | ICD-10-CM

## 2016-09-29 ENCOUNTER — Ambulatory Visit (INDEPENDENT_AMBULATORY_CARE_PROVIDER_SITE_OTHER): Payer: 59 | Admitting: Pulmonary Disease

## 2016-09-29 ENCOUNTER — Encounter: Payer: Self-pay | Admitting: Pulmonary Disease

## 2016-09-29 VITALS — BP 130/80 | HR 76 | Ht 66.0 in | Wt 178.0 lb

## 2016-09-29 DIAGNOSIS — R05 Cough: Secondary | ICD-10-CM | POA: Diagnosis not present

## 2016-09-29 DIAGNOSIS — R0600 Dyspnea, unspecified: Secondary | ICD-10-CM

## 2016-09-29 DIAGNOSIS — R059 Cough, unspecified: Secondary | ICD-10-CM

## 2016-09-29 LAB — PULMONARY FUNCTION TEST
DL/VA % PRED: 101 %
DL/VA: 4.43 ml/min/mmHg/L
DLCO COR: 23.09 ml/min/mmHg
DLCO cor % pred: 85 %
DLCO unc % pred: 86 %
DLCO unc: 23.29 ml/min/mmHg
FEF 25-75 Post: 2.75 L/sec
FEF 25-75 Pre: 2.63 L/sec
FEF2575-%CHANGE-POST: 4 %
FEF2575-%PRED-PRE: 92 %
FEF2575-%Pred-Post: 96 %
FEV1-%CHANGE-POST: 2 %
FEV1-%PRED-PRE: 85 %
FEV1-%Pred-Post: 87 %
FEV1-Post: 2.87 L
FEV1-Pre: 2.8 L
FEV1FVC-%CHANGE-POST: 3 %
FEV1FVC-%Pred-Pre: 102 %
FEV6-%Change-Post: 0 %
FEV6-%PRED-PRE: 86 %
FEV6-%Pred-Post: 85 %
FEV6-PRE: 3.52 L
FEV6-Post: 3.5 L
FEV6FVC-%Change-Post: 0 %
FEV6FVC-%PRED-PRE: 103 %
FEV6FVC-%Pred-Post: 104 %
FVC-%CHANGE-POST: 0 %
FVC-%PRED-POST: 82 %
FVC-%PRED-PRE: 83 %
FVC-POST: 3.5 L
FVC-PRE: 3.54 L
POST FEV6/FVC RATIO: 100 %
PRE FEV1/FVC RATIO: 79 %
PRE FEV6/FVC RATIO: 100 %
Post FEV1/FVC ratio: 82 %
RV % pred: 72 %
RV: 1.41 L
TLC % pred: 94 %
TLC: 5.85 L

## 2016-09-29 LAB — NITRIC OXIDE: NITRIC OXIDE: 14

## 2016-09-29 NOTE — Patient Instructions (Signed)
Continue using the qvar inhaler. Its ok to take it once a day.  Return to clinic in 6 months.

## 2016-09-29 NOTE — Progress Notes (Signed)
Shawn Chang    [id number]    12/26/1960  Primary Care Physician:No PCP Per Patient  Referring Physician: No referring provider defined for this encounter.  Chief complaint:  Follow up for evaluation of recurrent bronchitis.  HPI: [name] is a 55 year old with past medical history of CABG 2013, coronary artery disease. He has been referred by Dr. Jacinto HalimGanji for evaluation of recurrent bronchitis. He's had 2 exacerbations and had to go on a prednisone taper in April and June of this year. He has daily symptoms of cough, wheeze. He has significant allergies and sinus problems. He had seen an allergist several years in the past and had skin testing which demonstrated sensitivity to pet dander, dust.  He works as a Merchandiser, retailservice engineer and leads an active outdoor life with hunting.   Interim history: He was started on Qvar at last inhaler. He notes an improvement in his dyspnea on exertion. He went hunting recently and was able to walk up a steep incline without getting too short of breath. He has in fact reduced his Qvar dose to about once a day.  Outpatient Encounter Prescriptions as of 09/29/2016  Medication Sig  . acetaminophen (TYLENOL) 500 MG tablet Take 1,000 mg by mouth every 12 (twelve) hours as needed for mild pain.  Marland Kitchen. aspirin EC 81 MG tablet Take 81 mg by mouth daily.  Marland Kitchen. atorvastatin (LIPITOR) 40 MG tablet Take 40 mg by mouth daily.  . beclomethasone (QVAR) 80 MCG/ACT inhaler Inhale 2 puffs into the lungs 2 (two) times daily.  Marland Kitchen. buPROPion (WELLBUTRIN XL) 150 MG 24 hr tablet Take 150 mg by mouth daily.  . cetirizine (ZYRTEC) 10 MG tablet Take 10 mg by mouth daily.  . clopidogrel (PLAVIX) 75 MG tablet Take 75 mg by mouth daily.  Marland Kitchen. losartan (COZAAR) 50 MG tablet Take 50 mg by mouth daily.  . metoprolol tartrate (LOPRESSOR) 25 MG tablet Take 25 mg by mouth 2 (two) times daily. Reported on 02/16/2016  . nitroGLYCERIN (NITROSTAT) 0.3 MG SL tablet Place 0.3 mg under the tongue  every 5 (five) minutes as needed for chest pain.  Marland Kitchen. omeprazole (PRILOSEC) 10 MG capsule Take 10 mg by mouth daily.  . VENTOLIN HFA 108 (90 Base) MCG/ACT inhaler INHALE 2 PUFFS INTO THE LUNGS EVERY 6 (SIX) HOURS AS NEEDED FOR WHEEZING OR SHORTNESS OF BREATH.  . beclomethasone (QVAR) 80 MCG/ACT inhaler Inhale 2 puffs into the lungs 2 (two) times daily.   No facility-administered encounter medications on file as of 09/29/2016.     Allergies as of 09/29/2016 - Review Complete 09/29/2016  Allergen Reaction Noted  . Morphine and related Nausea And Vomiting 10/13/2015    Past Medical History:  Diagnosis Date  . Hypertension   . Myocardial infarction     Past Surgical History:  Procedure Laterality Date  . CARDIAC CATHETERIZATION N/A 10/01/2015   Procedure: Left Heart Cath and Coronary Angiography;  Surgeon: Yates DecampJay Ganji, MD;  [facility];  Service: Cardiovascular;  Laterality: N/A;  . CARDIAC CATHETERIZATION N/A 10/08/2015   Procedure: Coronary Stent Intervention;  Surgeon: Yates DecampJay Ganji, MD;  [facility];  Service: Cardiovascular;  Laterality: N/A;  . CORONARY ARTERY BYPASS GRAFT N/A 10/12/2015   Procedure: CORONARY ARTERY BYPASS GRAFTING (CABG) x five , using left internal mammary artery and bilateral legs greater saphenous vein harvested endoscopically;  Surgeon: Kerin PernaPeter Van Trigt, MD;  [facility];  Service: Open Heart Surgery;  Laterality: N/A;  . CORONARY ARTERY BYPASS GRAFT    . KNEE SURGERY    . PERIPHERAL VASCULAR CATHETERIZATION  10/08/2015   Procedure: Abdominal Aortogram;  Surgeon: Yates Decamp, MD;  Location: [facility] INVASIVE CV LAB;  Service: Cardiovascular;;  . PERIPHERAL VASCULAR CATHETERIZATION  10/08/2015   Procedure: Aortic Arch Angiography;  Surgeon: Yates Decamp, MD;  Location: [facility] INVASIVE CV LAB;  Service: Cardiovascular;;  . shoulder surgery    . TEE WITHOUT CARDIOVERSION N/A 10/12/2015   Procedure: TRANSESOPHAGEAL ECHOCARDIOGRAM (TEE);  Surgeon: Kerin Perna, MD;  Location: [facility] OR;  Service: Open Heart Surgery;  Laterality: N/A;  . TONSILLECTOMY     Pt has also had sinus and throat surgery    Family History  Problem Relation Age of Onset  . Heart disease Mother     May 2017  . Heart disease Father   . Hypertension Sister   . Stroke Sister   . Heart attack Brother     massive, survived  . Heart attack Brother     Social History   Social History  . Marital status: Married    Spouse name: N/A  . Number of children: N/A  . Years of education: N/A   Occupational History  . Not on file.   Social History Main Topics  . Smoking status: Former Smoker    Packs/day: 1.50    Years: 39.00    Types: Cigarettes    Quit date: 08/31/2015  . Smokeless tobacco: Never Used  . Alcohol use 0.0 oz/week     Comment: 1-3 beers per week  . Drug use: No  . Sexual activity: Not on file   Other Topics Concern  . Not on file   Social History Narrative   Married, lives with spouse   2 children   Merchandiser, retail w/ extensive traveling throughout the [place] - travels 48weeks per year   Review of systems: Review of Systems  Constitutional: Negative for fever and chills.  HENT: Negative.   Eyes: Negative for blurred vision.  Respiratory: as per HPI  Cardiovascular: Negative for chest pain and palpitations.  Gastrointestinal: Negative for vomiting, diarrhea, blood per rectum. Genitourinary: Negative for dysuria, urgency, frequency and hematuria.  Musculoskeletal: Negative for myalgias, back pain and joint pain.  Skin: Negative for itching and rash.  Neurological: Negative for dizziness, tremors, focal weakness, seizures and loss of consciousness.  Endo/Heme/Allergies: Negative for environmental allergies.  Psychiatric/Behavioral: Negative for depression, suicidal ideas and hallucinations.  All other systems reviewed and are negative.  Physical Exam: Blood pressure 122/74, pulse 62, height 5\' 6"  (1.676 m), weight 170 lb 3.2 oz (77.2 kg),  SpO2 97 %. Gen:      No acute distress HEENT:  EOMI, sclera anicteric Neck:     No masses; no thyromegaly Lungs:    Clear to auscultation bilaterally; normal respiratory effort CV:         Regular rate and rhythm; no murmurs Abd:      + bowel sounds; soft, non-tender; no palpable masses, no distension Ext:    No edema; adequate peripheral perfusion Skin:      Warm and dry; no rash Neuro: alert and oriented x 3 Psych: normal mood and affect  Data Reviewed: CBC  06/16/16- Absolute eosinophil count- 800/microL 06/26/16-Absolute eosinophil count- 821/microL Blood allergy profile 06/26/16-sensitive for Dog dander. IgE-18  PFTs 10/08/15 FVC 3.54 [82%) FEV1 2.54 [76%) F/F 72 Minimal obstructive airway disease.  09/29/16 FVC 2.50 (82%) FEV1 2.87 [87%) F/F 82 TLC  94% DLCO 86% Minimal obstructive airway disease  FENO 09/29/16- 14  CT chest 03/03/16-no acute lung abnormalities. All images reviewed  Assessment:  [name] likely has asthma based on his symptoms and history of atopic allergies. He's been on several prednisone tapers recently with daily symptoms of cough, wheezing. Labs noted with elevated eosinophils. PFTs were reviewed with him today. They show only very minimal obstructive disease.  Even though PFTs are normal he has responded well to inhaled corticosteroids. 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 FENO is normal in office today indicating adequate control of symptoms.   He will continue on Qvar inhaler and continue the albuterol when necessary. He's taking the qvar about once a day. I told him it's okay to do so as long as his symptoms remain under control.   Plan/Recommendations: - Continue Qvar inhaler - Continue albuterol rescue inhaler  Chilton GreathousePraveen Alianah Lofton MD [facility] Pager [phone] 09/29/2016, 4:39 PM  CC: No ref. provider found

## 2016-11-18 ENCOUNTER — Other Ambulatory Visit: Payer: Self-pay | Admitting: Pulmonary Disease

## 2017-01-25 ENCOUNTER — Emergency Department (HOSPITAL_COMMUNITY)
Admission: EM | Admit: 2017-01-25 | Discharge: 2017-01-26 | Disposition: A | Discharge status: Home / Self Care | Payer: 59 | Attending: Emergency Medicine | Admitting: Emergency Medicine

## 2017-01-25 ENCOUNTER — Encounter (HOSPITAL_COMMUNITY): Payer: Self-pay | Admitting: Emergency Medicine

## 2017-01-25 DIAGNOSIS — Z87891 Personal history of nicotine dependence: Secondary | ICD-10-CM | POA: Diagnosis not present

## 2017-01-25 DIAGNOSIS — I10 Essential (primary) hypertension: Secondary | ICD-10-CM | POA: Insufficient documentation

## 2017-01-25 DIAGNOSIS — Z955 Presence of coronary angioplasty implant and graft: Secondary | ICD-10-CM | POA: Diagnosis not present

## 2017-01-25 DIAGNOSIS — H3322 Serous retinal detachment, left eye: Secondary | ICD-10-CM | POA: Insufficient documentation

## 2017-01-25 DIAGNOSIS — I252 Old myocardial infarction: Secondary | ICD-10-CM | POA: Insufficient documentation

## 2017-01-25 DIAGNOSIS — H579 Unspecified disorder of eye and adnexa: Secondary | ICD-10-CM | POA: Diagnosis present

## 2017-01-25 DIAGNOSIS — I2510 Atherosclerotic heart disease of native coronary artery without angina pectoris: Secondary | ICD-10-CM | POA: Insufficient documentation

## 2017-01-25 DIAGNOSIS — Z7982 Long term (current) use of aspirin: Secondary | ICD-10-CM | POA: Insufficient documentation

## 2017-01-25 HISTORY — DX: Atherosclerotic heart disease of native coronary artery without angina pectoris: I25.10

## 2017-01-25 NOTE — ED Notes (Signed)
Pt asking for food. Pt stated that he takes several medicines and if he does not eat, it affects him. Informed Dr. Rosalia Hammersay.

## 2017-01-25 NOTE — ED Provider Notes (Signed)
[facility]-EMERGENCY DEPT Provider Note   CSN: [id number] Arrival date & time: 01/25/17  2147   By signing my name below, I, Clovis Pu, attest that this documentation has been prepared under the direction and in the presence of Zadie Rhine, MD  Electronically Signed: Clovis Pu, ED Scribe. 01/25/17. 11:24 PM.   History   Chief Complaint Chief Complaint  Patient presents with  . Visual Field Change    HPI Comments:  Shawn Chang is a 56 y.o. male, with a PMHx of CAD on aspirin, HTN, MI and PSHx of CABG, who presents to the Emergency Department complaining of acute onset, moderate visual field loss in his left eye (closest to his nose) x 6 PM today. He states it is like he is looking through "blue/green tapioca pudding". Pt states his symptoms began after he got off of a flight today. No alleviating or modifying factors noted. Pt denies fevers, vomiting, chest pain, SOB, weakness to his extremities, numbness, eye pain or any other associated symptoms. He also denies a hx of surgeries to his eye. Pt states he was recently seen by his optometrist a few weeks ago due to a visual disturbance which he thought was related to a new allergy to dogs and states this issue self resolved. No other complaints noted.   Optometrist: Dr. Hyacinth Meeker.  Cardiology: Dr. Jacinto Halim   The history is provided by the patient. No language interpreter was used.    Past Medical History:  Diagnosis Date  . Coronary artery disease   . Hypertension   . Myocardial infarction     Patient Active Problem List   Diagnosis Date Noted  . CAD (coronary artery disease) 10/08/2015  . Abnormal nuclear stress test 09/24/2015  . Coronary atherosclerosis due to calcified coronary lesion 09/24/2015  . Angina pectoris (HCC) 09/09/2015  . Essential hypertension 02/15/2015    Past Surgical History:  Procedure Laterality Date  . CARDIAC CATHETERIZATION N/A 10/01/2015   Procedure: Left Heart Cath and Coronary Angiography;   Surgeon: Yates Decamp, MD;  [facility] INVAS[facility] CV LAB;  Service: Cardiovascular;  Laterality: N/A;  . CARDIAC CATHETERIZATION N/A 10/08/2015   Procedure: Coronary Stent Intervention;  Surgeon: Yates Decamp, MD;  Location[facility] INVAS[facility] CV LAB;  Service: Cardiovascular;  Laterality: N/A;  . CORONARY ARTERY BYPASS GRAFT N/A 10/12/2015   Procedure: CORONARY ARTERY BYPASS GRAFTING (CABG) x five , using left internal mammary artery and bilateral legs greater saphenous vein harvested endoscopically;  Surgeon: Kerin Perna, MD;  Location: [facility] OR;  Service: Open Heart Surgery;  Laterality: N/A;  . CORONARY ARTERY BYPASS GRAFT    . KNEE SURGERY    . PERIPHERAL VASCULAR CATHETERIZATION  10/08/2015   Procedure: Abdominal Aortogram;  Surgeon: Yates Decamp, MD;  Location: [facility] INVASIVE CV LAB;  Service: Cardiovascular;;  . PERIPHERAL VASCULAR CATHETERIZATION  10/08/2015   Procedure: Aortic Arch Angiography;  Surgeon: Yates Decamp, MD;  Location: [facility] INVASIVE CV LAB;  Service: Cardiovascular;;  . shoulder surgery    . TEE WITHOUT CARDIOVERSION N/A 10/12/2015   Procedure: TRANSESOPHAGEAL ECHOCARDIOGRAM (TEE);  Surgeon: Kerin Perna, MD;  Location: [facility] OR;  Service: Open Heart Surgery;  Laterality: N/A;  . TONSILLECTOMY     Pt has also had sinus and throat surgery    Home Medications    Prior to Admission medications   Medication Sig Start Date End Date Taking? Authorizing Provider  acetaminophen (TYLENOL) 500 MG tablet Take 1,000 mg by mouth every 12 (twelve) hours as needed for mild  pain.    Historical Provider, MD  aspirin EC 81 MG tablet Take 81 mg by mouth daily.    Historical Provider, MD  atorvastatin (LIPITOR) 40 MG tablet Take 40 mg by mouth daily. 06/22/16   Historical Provider, MD  beclomethasone (QVAR) 80 MCG/ACT inhaler Inhale 2 puffs into the lungs 2 (two) times daily. 06/27/16 06/28/16  Praveen Mannam, MD  buPROPion (WELLBUTRIN XL) 150 MG 24 hr tablet Take 150 mg by mouth daily. 06/16/16   Historical  Provider, MD  cetirizine (ZYRTEC) 10 MG tablet Take 10 mg by mouth daily.    Historical Provider, MD  clopidogrel (PLAVIX) 75 MG tablet Take 75 mg by mouth daily.    Historical Provider, MD  losartan (COZAAR) 50 MG tablet Take 50 mg by mouth daily. 06/16/16   Historical Provider, MD  metoprolol tartrate (LOPRESSOR) 25 MG tablet Take 25 mg by mouth 2 (two) times daily. Reported on 02/16/2016    Historical Provider, MD  nitroGLYCERIN (NITROSTAT) 0.3 MG SL tablet Place 0.3 mg under the tongue every 5 (five) minutes as needed for chest pain.    Historical Provider, MD  omeprazole (PRILOSEC) 10 MG capsule Take 10 mg by mouth daily.    Historical Provider, MD  QVAR 80 MCG/ACT inhaler INHALE 2 PUFFS INTO THE LUNGS 2 (TWO) TIMES DAILY. 11/20/16   Praveen Mannam, MD  VENTOLIN HFA 108 (90 Base) MCG/ACT inhaler INHALE 2 PUFFS INTO THE LUNGS EVERY 6 (SIX) HOURS AS NEEDED FOR WHEEZING OR SHORTNESS OF BREATH. 06/30/16   Ofilia Neas, PA-C    Family History Family History  Problem Relation Age of Onset  . Heart disease Mother     May 2017  . Heart disease Father   . Hypertension Sister   . Stroke Sister   . Heart attack Brother     massive, survived  . Heart attack Brother     Social History Social History  Substance Use Topics  . Smoking status: Former Smoker    Packs/day: 1.50    Years: 39.00    Types: Cigarettes    Quit date: 08/31/2015  . Smokeless tobacco: Never Used  . Alcohol use 0.0 oz/week     Comment: 1-3 beers per week     Allergies   Morphine and related   Review of Systems Review of Systems  Constitutional: Negative for fever.  Eyes: Positive for visual disturbance. Negative for pain.  Respiratory: Negative for shortness of breath.   Cardiovascular: Negative for chest pain.  Gastrointestinal: Negative for vomiting.  Neurological: Negative for weakness and numbness.  All other systems reviewed and are negative.    Physical Exam Updated Vital Signs BP (!) 183/103 (BP  Location: Right Arm)   Pulse 70   Temp 97.7 F (36.5 C) (Oral)   Resp 19   SpO2 99%   Physical Exam CONSTITUTIONAL: Well developed/well nourished HEAD: Normocephalic/atraumatic EYES: EOMI/PERRL, no nystagmus,   no ptosis No corneal hazing.  OS - nasal field deficit (reports seeing green) No obvious abnormality on fundoscopic exam.  No visual field deficit in lateral visual field.  No visual deficit in OD.  Unable to obtain visual acuity for OS ENMT: Mucous membranes moist NECK: supple no meningeal signs, no bruits CV: S1/S2 noted, no murmurs/rubs/gallops noted LUNGS: Lungs are clear to auscultation bilaterally, no apparent distress ABDOMEN: soft, nontender, no rebound or guarding GU:no cva tenderness NEURO:Awake/alert, face symmetric, no arm or leg drift is noted Equal 5/5 strength with shoulder abduction, elbow flex/extension, wrist flex/extension  in upper extremities and equal hand grips bilaterally Equal 5/5 strength with hip flexion,knee flex/extension, foot dorsi/plantar flexion Cranial nerves 3/4/5/6/05/07/09/11/12 tested and intact No past pointing Sensation to light touch intact in all extremities EXTREMITIES: pulses normal, full ROM SKIN: warm, color normal PSYCH: no abnormalities of mood noted  ED Treatments / Results  DIAGNOSTIC STUDIES:  Oxygen Saturation is 99% on RA, normal by my interpretation.    COORDINATION OF CARE:  11:20 PM Discussed treatment plan with pt at bedside and pt agreed to plan.  Labs (all labs ordered are listed, but only abnormal results are displayed) Labs Reviewed - No data to display  EKG  EKG Interpretation None       Radiology No results found.  Procedures Procedures (including critical care time)  Medications Ordered in ED Medications - No data to display   Initial Impression / Assessment and Plan / ED Course  I have reviewed the triage vital signs and the nursing notes.      1:03 AM I spoke to Dr Edrick Ohing with  ophthalmology She will see patient in the ED for retinal detachment Bedside US reveals RD Pt updated on plan   Prior to discharge, pt seen by dr Edrick Ohding, found to have retinal detachment and she has arranged f/u tomorrow with retina specialist dr Lita Mainshaines Pt stable and agreeable with plan for followup  Pt had no stroke symptoms, he is well appearing and ambulatory  Final Clinical Impressions(s) / ED Diagnoses   Final diagnoses:  Left retinal detachment    New Prescriptions New Prescriptions   No medications on file  I personally performed the services described in this documentation, which was scribed in my presence. The recorded information has been reviewed and is accurate.        Zadie Rhineonald Stevee Valenta, MD 01/26/17 [phone]

## 2017-01-25 NOTE — ED Triage Notes (Signed)
Patient with loss of vision in left eye.  Patient states that he has no vision from upper left to lower eye.  He has vision in upper left corner and states that the rest is "green tapioca" veiny look.  When he closes his eyes, it is white where he can not see, and it is dark in the area that he can see.  Patient denies any HA, denies any curtain lowering look.  He states that a few weeks ago he had an allergy attack and was coughing and was told that he damaged the edge of his fluid sack.  Saw Dr Hyacinth MeekerMiller, Opthalmologist.

## 2017-01-26 NOTE — ED Notes (Signed)
Pt unable to sign upon departure due to chart being "read only" at the time. Pt verbalized understanding of discharge instructions.

## 2017-01-26 NOTE — ED Provider Notes (Signed)
EMERGENCY DEPARTMENT [place] OCULAR EXAM "Study: Limited Ultrasound of Orbit "  INDICATIONS: Vision loss Linear probe utilized to obtain images in both long and short axis of the orbit having the patient look left and right if possible.  PERFORMED BY: Myself IMAGES ARCHIVED?: Yes LIMITATIONS: none VIEWS USED: Left orbit INTERPRETATION: Retinal detachment present    Margarita Grizzle, MD 01/26/17 0006

## 2017-01-26 NOTE — Discharge Instructions (Signed)
Please followup with dr Lita Mains as discussed with the eye specialist

## 2017-01-26 NOTE — Consult Note (Signed)
Shawn Chang                                                                                         01/26/2017                                               Ophthalmology Evaluation                                          Consult Requested by:  ED Consultation requested for:  Daisy Lazar, MD    HPI:  56 y/o WM with acute loss of vision OS.  Patient notes onset of new floaters 3 wks ago with coughing fit from allergy attack.  Was seen by optometrist (Dr. Salvatore Marvel, then Dr. Blima Ledger) and advised he had presumed PVD.  About 12 hours ago, he was on an airplane, noticed color changes OS which was followed by darkening of vision nasally.  Since then, the area/size of poor vision nasally has fluctuated.  Otherwise, no c/o eye pain.  Pertinent Medical History:  Active Ambulatory Problems    Diagnosis Date Noted  . Essential hypertension 02/15/2015  . Angina pectoris (HCC) 09/09/2015  . Abnormal nuclear stress test 09/24/2015  . Coronary atherosclerosis due to calcified coronary lesion 09/24/2015  . CAD (coronary artery disease) 10/08/2015   Resolved Ambulatory Problems    Diagnosis Date Noted  . No Resolved Ambulatory Problems   Past Medical History:  Diagnosis Date  . Coronary artery disease   . Hypertension   . Myocardial infarction     Pertinent Ophthalmic History:  Myopia OU  Current Eye Medications:  None  ROS: Constitutional:  No fever, no chills, no weight change.  Ocular: as per above.  HEENT:  +headache since being in ED.  No sore throat, earache, or congestion. No neck pain.  CV:  No chest pain. No palpitations.  Respiratory:  No shortness of breath or cough.  GI:  No nausea, no vomiting, no diarrhea, no constipation, no anorexia.  GU:  No dysuria, frequency, or urgency. No hematuria. No vaginal discharge or vaginal bleeding.  Musculoskeletal:  No joint pain or swelling or edema.  Skin:  No rash or itching.  Endocrine:  No polyuria or  polydipsia. Psychiatric:  No anxiety, no depression.    Confrontation Visual Fields:  OD:  Full to count fingers OS:  Full to count fingers temporally, unable nasally  Extraocular muscles (EOMs): OD:  Full OS:  Full  Pupils:  OD:  Round, reactive, equal, symmetric, no rAPD OS:  Round, reactive, equal, symmetric, +rAPD  Visual Acuity: Near vision without correction OD:  20/20-1 OS:  CF temporally with eccentric vision  Intraocular Pressure (IOP):  With Tonopen OD: 15 mmhg         OS: 13 mmhg    Lids/Lashes:  OD:  Normal for age OS:  Normal for age  Conjunctiva/Sclera:  OD:  White and quiet OS:  White and quiet  Cornea:  OD: Clear OS: Clear  Anterior Chamber [facility]):  OD:  Deep and quiet OS:  Deep and quiet  Iris:  OD:  Round and reactive   OS:  Round and reactive     Lens:  OD:  Mild NSC OS:  Mild NSC  Dilation:  Both eyes Medication used  [ x ] Phenylephrine 2.5% [ x ]Tropicamide  [  ] Cyclogyl [  ] Cyclomydril      Vitreous: OD:  Clear OS:  Clear  Optic Nerve (ON): OD:  Sharp margins, pink neuroretinal rim, C:D 0.55 OS:  Sharp margins, pink neuroretinal rim, C:D 0.55  Macula: OD:  Flat without hemorrhage OS:  Flat without hemorrhage  Retina Vessels: OD:  Normal for age OS:  Normal for age  Peripheral Retina:  OD:  Flat and attached in all areas seen  OS:  Flat and attached in all areas seen   Impression/Discussion: 56 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    Recommendations/Plan: Pt advised to sleep on left side, minimize straining.  Refer to surgical retina for consultation of repair, d/w Dr. Thornell Mule (Retina Specialist on-call for [facility] Specialist), pt to f/u with Dr. Lita Mains today at 12:00 PM.  NPO after midnight.     Planned studies/Lab testing:  None from Ophthalmology  Procedures:  None from Ophthalmology  Daisy Lazar  MD

## 2017-06-28 IMAGING — DX DG CHEST 1V PORT
1 series · 1 of 1 positions shown · non-contrast
Comparison: 10/13/2015

CLINICAL DATA: Status post CABG.

EXAM:
PORTABLE CHEST 1 VIEW

[chest ap]
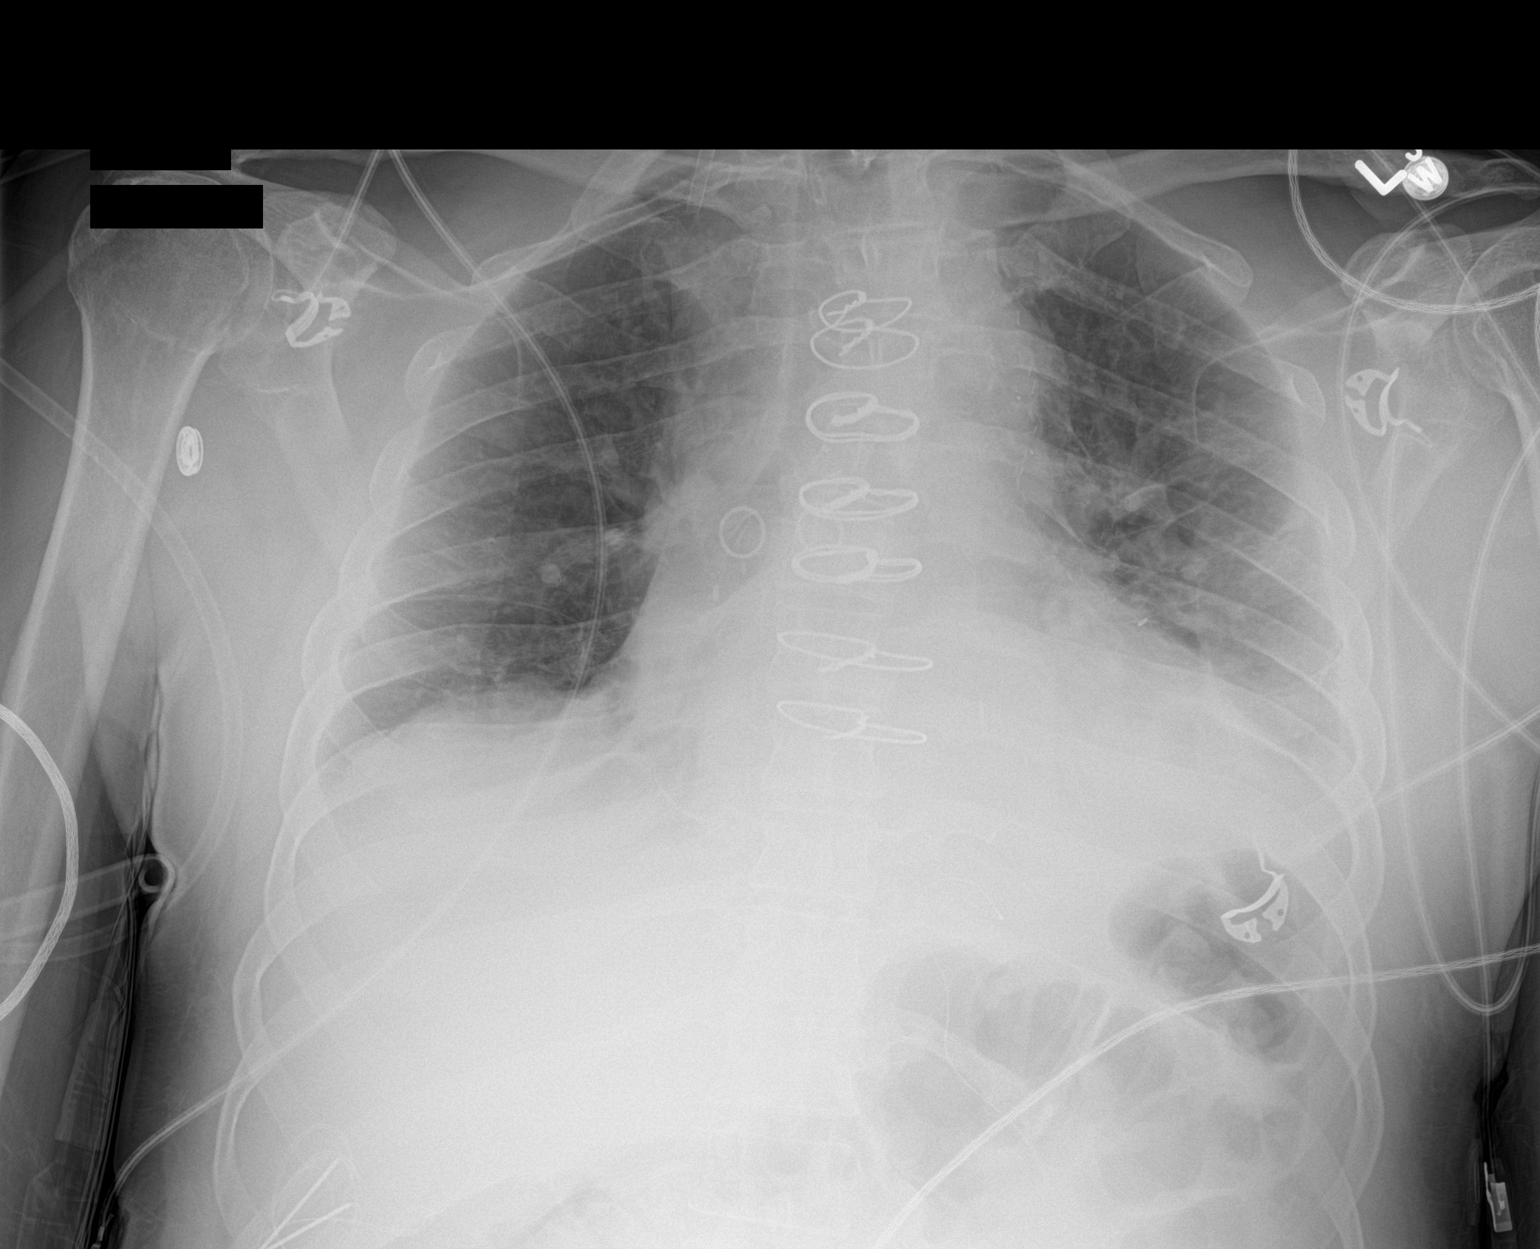

[1 of 1 positions shown; findings below may reference images not displayed]

FINDINGS: Swan-Ganz catheter, mediastinal drain and left chest tube have been
removed. No pneumothorax identified. Lungs show bilateral lower lobe
atelectasis, left greater than right. Likely is a small left pleural
effusion. No overt edema. The heart size and mediastinal contours
are stable.
IMPRESSION: No pneumothorax. Bilateral lower lobe atelectasis, left greater than
right.

## 2017-07-04 ENCOUNTER — Other Ambulatory Visit: Payer: Self-pay

## 2017-07-04 MED ORDER — BECLOMETHASONE DIPROP HFA 80 MCG/ACT IN AERB: 2.0000 | INHALATION_SPRAY | Freq: Two times a day (BID) | RESPIRATORY_TRACT | 0 refills | Status: DC

## 2017-07-04 NOTE — Telephone Encounter (Signed)
Received Rx refill for Qvar 80 redihaler. Rx has been sent to preferred pharmacy with a note that ot needs apt for fither refills, as pt was last seen 09/29/16 and instructed to f/u in 37mo. No pending apt. Nothing further needed.

## 2017-09-14 ENCOUNTER — Other Ambulatory Visit: Payer: Self-pay | Admitting: Pulmonary Disease

## 2017-10-24 ENCOUNTER — Emergency Department (HOSPITAL_COMMUNITY)
Admission: EM | Admit: 2017-10-24 | Discharge: 2017-10-25 | Disposition: A | Discharge status: Home / Self Care | Payer: 59 | Attending: Emergency Medicine | Admitting: Emergency Medicine

## 2017-10-24 ENCOUNTER — Encounter (HOSPITAL_COMMUNITY): Payer: Self-pay | Admitting: *Deleted

## 2017-10-24 ENCOUNTER — Emergency Department (HOSPITAL_COMMUNITY): Payer: 59

## 2017-10-24 ENCOUNTER — Other Ambulatory Visit: Payer: Self-pay

## 2017-10-24 DIAGNOSIS — Z79899 Other long term (current) drug therapy: Secondary | ICD-10-CM | POA: Diagnosis not present

## 2017-10-24 DIAGNOSIS — I10 Essential (primary) hypertension: Secondary | ICD-10-CM | POA: Insufficient documentation

## 2017-10-24 DIAGNOSIS — Z951 Presence of aortocoronary bypass graft: Secondary | ICD-10-CM | POA: Insufficient documentation

## 2017-10-24 DIAGNOSIS — Z7982 Long term (current) use of aspirin: Secondary | ICD-10-CM | POA: Diagnosis not present

## 2017-10-24 DIAGNOSIS — R112 Nausea with vomiting, unspecified: Secondary | ICD-10-CM | POA: Diagnosis not present

## 2017-10-24 DIAGNOSIS — I2510 Atherosclerotic heart disease of native coronary artery without angina pectoris: Secondary | ICD-10-CM | POA: Diagnosis not present

## 2017-10-24 DIAGNOSIS — R1030 Lower abdominal pain, unspecified: Secondary | ICD-10-CM

## 2017-10-24 DIAGNOSIS — K5732 Diverticulitis of large intestine without perforation or abscess without bleeding: Secondary | ICD-10-CM | POA: Diagnosis not present

## 2017-10-24 DIAGNOSIS — R197 Diarrhea, unspecified: Secondary | ICD-10-CM

## 2017-10-24 DIAGNOSIS — Z87891 Personal history of nicotine dependence: Secondary | ICD-10-CM | POA: Insufficient documentation

## 2017-10-24 HISTORY — DX: Serous retinal detachment, unspecified eye: H33.20

## 2017-10-24 LAB — COMPREHENSIVE METABOLIC PANEL
ALBUMIN: 3.3 g/dL — AB (ref 3.5–5.0)
ALT: 21 U/L (ref 17–63)
ANION GAP: 10 (ref 5–15)
AST: 22 U/L (ref 15–41)
Alkaline Phosphatase: 61 U/L (ref 38–126)
BILIRUBIN TOTAL: 0.8 mg/dL (ref 0.3–1.2)
BUN: 14 mg/dL (ref 6–20)
CO2: 23 mmol/L (ref 22–32)
Calcium: 9 mg/dL (ref 8.9–10.3)
Chloride: 101 mmol/L (ref 101–111)
Creatinine, Ser: 1.41 mg/dL — ABNORMAL HIGH (ref 0.61–1.24)
GFR calc Af Amer: >60 mL/min (ref >60)
GFR calc non Af Amer: 54 mL/min — ABNORMAL LOW (ref >60)
GLUCOSE: 155 mg/dL — AB (ref 65–99)
POTASSIUM: 3.6 mmol/L (ref 3.5–5.1)
Sodium: 134 mmol/L — ABNORMAL LOW (ref 135–145)
TOTAL PROTEIN: 6.5 g/dL (ref 6.5–8.1)

## 2017-10-24 LAB — LIPASE, BLOOD: Lipase: 37 U/L (ref 11–51)

## 2017-10-24 LAB — CBC
HEMATOCRIT: 45.9 % (ref 39.0–52.0)
HEMOGLOBIN: 15.8 g/dL (ref 13.0–17.0)
MCH: 31 pg (ref 26.0–34.0)
MCHC: 34.4 g/dL (ref 30.0–36.0)
MCV: 90.2 fL (ref 78.0–100.0)
Platelets: 169 10*3/uL (ref 150–400)
RBC: 5.09 MIL/uL (ref 4.22–5.81)
RDW: 13 % (ref 11.5–15.5)
WBC: 10.5 10*3/uL (ref 4.0–10.5)

## 2017-10-24 LAB — I-STAT TROPONIN, ED: TROPONIN I, POC: 0 ng/mL (ref 0.00–0.08)

## 2017-10-24 MED ORDER — IOPAMIDOL (ISOVUE-300) INJECTION 61%
INTRAVENOUS | Status: AC
  Administered 2017-10-25: 100 mL
  Filled 2017-10-24: qty 100

## 2017-10-24 MED ORDER — ONDANSETRON HCL 4 MG/2ML IJ SOLN
4.0000 mg | Freq: Once | INTRAMUSCULAR | Status: AC
  Administered 2017-10-25: 4 mg via INTRAVENOUS
  Filled 2017-10-24: qty 2

## 2017-10-24 MED ORDER — SODIUM CHLORIDE 0.9 % IV BOLUS (SEPSIS)
1000.0000 mL | Freq: Once | INTRAVENOUS | Status: AC
  Administered 2017-10-25: 1000 mL via INTRAVENOUS

## 2017-10-24 NOTE — ED Triage Notes (Signed)
Pt reports vomiting and diarrhea since Sunday. States that he felt better today and went to work. About two hours ago started having discomfort in the upper abdomen, pain comes and goes, states tender to touch. No meds PTA.

## 2017-10-24 NOTE — ED Provider Notes (Signed)
[facility] Provider Note   CSN: [id number] Arrival date & time: 10/24/17  1942     History   Chief Complaint Chief Complaint  Patient presents with  . Abdominal Pain    HPI Shawn Chang is a 56 y.o. male.  56 year old male with past medical history including CAD status post MI, hypertension who p/w abdominal pain, vomiting, and diarrhea.  3 days ago, he began having vomiting and diarrhea associated with intermittent abdominal pain.  His symptoms seemed to be better today although he was continuing to have diarrhea and he went to work.  He had one small Toqeer this afternoon and began having abdominal pain and vomiting again.  His diarrhea has been nonbloody, no improvement after taking Imodium yesterday and today.  His last episode of vomiting was this evening.  He had a few nights of fevers and chills at the onset of his symptoms.  No associated cough/cold symptoms, urinary symptoms, chest pain, or shortness of breath.  No sick contacts, antibiotic use, recent international travel, or new medications.   The history is provided by the patient.    Past Medical History:  Diagnosis Date  . Coronary artery disease   . Hypertension   . Myocardial infarction (HCC)   . Retinal detachment     Patient Active Problem List   Diagnosis Date Noted  . CAD (coronary artery disease) 10/08/2015  . Abnormal nuclear stress test 09/24/2015  . Coronary atherosclerosis due to calcified coronary lesion 09/24/2015  . Angina pectoris (HCC) 09/09/2015  . Essential hypertension 02/15/2015    Past Surgical History:  Procedure Laterality Date  . CARDIAC CATHETERIZATION N/A 10/01/2015   Procedure: Left Heart Cath and Coronary Angiography;  Surgeon: Yates DecampJay Ganji, MD;  [facility];  Service: Cardiovascular;  Laterality: N/A;  . CARDIAC CATHETERIZATION N/A 10/08/2015   Procedure: Coronary Stent Intervention;  Surgeon: Yates DecampJay Ganji, MD;  [facility];  Service: Cardiovascular;  Laterality: N/A;  . CORONARY ARTERY BYPASS GRAFT N/A 10/12/2015   Procedure: CORONARY ARTERY BYPASS GRAFTING (CABG) x five , using left internal mammary artery and bilateral legs greater saphenous vein harvested endoscopically;  Surgeon: Kerin PernaPeter Van Trigt, MD;  [facility];  Service: Open Heart Surgery;  Laterality: N/A;  . CORONARY ARTERY BYPASS GRAFT    . KNEE SURGERY    . PERIPHERAL VASCULAR CATHETERIZATION  10/08/2015   Procedure: Abdominal Aortogram;  Surgeon: Yates DecampJay Ganji, MD;  [facility];  Service: Cardiovascular;;  . PERIPHERAL VASCULAR CATHETERIZATION  10/08/2015   Procedure: Aortic Arch Angiography;  Surgeon: Yates DecampJay Ganji, MD;  [facility];  Service: Cardiovascular;;  . shoulder surgery    . TEE WITHOUT CARDIOVERSION N/A 10/12/2015   Procedure: TRANSESOPHAGEAL ECHOCARDIOGRAM (TEE);  Surgeon: Kerin PernaPeter Van Trigt, MD;  [facility];  Service: Open Heart Surgery;  Laterality: N/A;  . TONSILLECTOMY     Pt has also had sinus and throat surgery       Home Medications    Prior to Admission medications   Medication Sig Start Date End Date Taking? Authorizing Provider  acetaminophen (TYLENOL) 500 MG tablet Take 1,000 mg by mouth every 12 (twelve) hours as needed for mild pain.   Yes [provider]  aspirin EC 81 MG tablet Take 81 mg by mouth daily.   Yes [provider]  atorvastatin (LIPITOR) 40 MG tablet Take 40 mg by mouth daily. 06/22/16  Yes [provider]  buPROPion (WELLBUTRIN SR) 150 MG 12 hr tablet Take 150 mg by mouth every morning. 10/02/17  Yes [provider]  cetirizine (ZYRTEC) 10 MG tablet Take 10 mg by mouth every morning.    Yes [provider]  losartan (COZAAR) 50 MG tablet Take 50 mg by mouth daily. 06/16/16  Yes [provider]  metoprolol tartrate (LOPRESSOR) 25 MG tablet Take 37.5 mg by mouth 2 (two) times daily. Reported on 02/16/2016   Yes  [provider]  nitroGLYCERIN (NITROSTAT) 0.4 MG SL tablet Place 0.4 mg under the tongue every 5 (five) minutes as needed for chest pain.   Yes [provider]  omeprazole (PRILOSEC) 10 MG capsule Take 10 mg by mouth daily.   Yes [provider]  QVAR 80 MCG/ACT inhaler INHALE 2 PUFFS INTO THE LUNGS 2 (TWO) TIMES DAILY. Patient taking differently: Inhale 2 puffs into the lungs daily.  11/20/16  Yes Mannam, Praveen, MD  VENTOLIN HFA 108 (90 Base) MCG/ACT inhaler INHALE 2 PUFFS INTO THE LUNGS EVERY 6 (SIX) HOURS AS NEEDED FOR WHEEZING OR SHORTNESS OF BREATH. 06/30/16  Yes Ofilia Neas, PA-C  beclomethasone (QVAR) 80 MCG/ACT inhaler Inhale 2 puffs into the lungs 2 (two) times daily. Patient not taking: Reported on 10/24/2017 06/27/16 10/24/17  Chilton Greathouse, MD  ciprofloxacin (CIPRO) 500 MG tablet Take 1 tablet (500 mg total) by mouth 2 (two) times daily. 10/25/17   Glendora Clouatre, Ambrose Finland, MD  metroNIDAZOLE (FLAGYL) 500 MG tablet Take 1 tablet (500 mg total) by mouth 2 (two) times daily. 10/25/17   Arianni Gallego, Ambrose Finland, MD  ondansetron (ZOFRAN ODT) 4 MG disintegrating tablet Take 1 tablet (4 mg total) by mouth every 8 (eight) hours as needed for nausea or vomiting. 10/25/17   Carlee Tesfaye, Ambrose Finland, MD  QVAR REDIHALER 80 MCG/ACT inhaler TAKE 2 PUFFS BY MOUTH TWICE A DAY Patient not taking: Reported on 10/24/2017 09/14/17   Chilton Greathouse, MD    Family History Family History  Problem Relation Age of Onset  . Heart disease Mother        May 2017  . Heart disease Father   . Hypertension Sister   . Stroke Sister   . Heart attack Brother        massive, survived  . Heart attack Brother     Social History Social History   Tobacco Use  . Smoking status: Former Smoker    Packs/day: 1.50    Years: 39.00    Pack years: 58.50    Types: Cigarettes    Last attempt to quit: 08/31/2015    Years since quitting: 2.1  . Smokeless tobacco: Never Used  Substance Use  Topics  . Alcohol use: Yes    Alcohol/week: 0.0 oz    Comment: 1-3 beers per week  . Drug use: No     Allergies   Other and Morphine and related   Review of Systems Review of Systems All other systems reviewed and are negative except that which was mentioned in HPI   Physical Exam Updated Vital Signs BP (!) 171/97   Pulse 84   Temp 98.8 F (37.1 C) (Oral)   Resp 18   Ht 5\' 6"  (1.676 m)   Wt 74.4 kg (164 lb)   SpO2 100%   BMI 26.47 kg/m   Physical Exam  Constitutional: He is oriented to person, place, and time. He appears well-developed and well-nourished. No distress.  HENT:  Head: Normocephalic and atraumatic.  Moist mucous membranes  Eyes: Conjunctivae are normal.  Neck: Neck supple.  Cardiovascular: Normal rate, regular rhythm and normal heart sounds.  No murmur heard. Pulmonary/Chest: Effort normal and breath sounds normal.  Abdominal: Soft. He exhibits no distension. Bowel sounds are increased. There is tenderness in the right lower quadrant, suprapubic area and left lower quadrant.  Musculoskeletal: He exhibits no edema.  Neurological: He is alert and oriented to person, place, and time.  Fluent speech  Skin: Skin is warm and dry.  Psychiatric: He has a normal mood and affect. Judgment normal.  Nursing note and vitals reviewed.    ED Treatments / Results  Labs (all labs ordered are listed, but only abnormal results are displayed) Labs Reviewed  COMPREHENSIVE METABOLIC PANEL - Abnormal; Notable for the following components:      Result Value   Sodium 134 (*)    Glucose, Bld 155 (*)    Creatinine, Ser 1.41 (*)    Albumin 3.3 (*)    GFR calc non Af Amer 54 (*)    All other components within normal limits  URINALYSIS, ROUTINE W REFLEX MICROSCOPIC - Abnormal; Notable for the following components:   Hgb urine dipstick SMALL (*)    All other components within normal limits  LIPASE, BLOOD  CBC  I-STAT TROPONIN, ED    EKG  EKG  Interpretation  Date/Time:  Wednesday October 24 2017 19:46:55 EST Ventricular Rate:  88 PR Interval:  172 QRS Duration: 100 QT Interval:  340 QTC Calculation: 411 R Axis:   7 Text Interpretation:  Normal sinus rhythm Possible Inferior infarct , age undetermined Abnormal ECG ST changes inferiorly similar to 2016 tracing. No STEMI.  Confirmed by Alona Bene [phone]) on 10/24/2017 7:53:45 PM Also confirmed by Alona Bene [phone]), editor Elita Quick [phone])  on 10/25/2017 6:52:18 AM       Radiology Dg Chest 2 View  Result Date: 10/24/2017 CLINICAL DATA:  56 year old male with epigastric pain. EXAM: CHEST  2 VIEW COMPARISON:  Chest CT dated 03/03/2016 FINDINGS: The lungs are clear. There is no pleural effusion or pneumothorax. The cardiac silhouette is within normal limits. Median sternotomy wires and CABG vascular clips noted. No acute osseous pathology. IMPRESSION: No active cardiopulmonary disease. Electronically Signed   By: Elgie Collard M.D.   On: 10/24/2017 20:56   Ct Abdomen Pelvis W Contrast  Result Date: 10/25/2017 CLINICAL DATA:  56 year old male with abdominal pain along vomiting and diarrhea. Concern for diverticulitis. EXAM: CT ABDOMEN AND PELVIS WITH CONTRAST TECHNIQUE: Multidetector CT imaging of the abdomen and pelvis was performed using the standard protocol following bolus administration of intravenous contrast. CONTRAST:  ISOVUE-300 IOPAMIDOL (ISOVUE-300) INJECTION 61% COMPARISON:  Chest CT dated 03/03/2016 and pelvic radiograph dated 06/03/2013 FINDINGS: Lower chest: The visualized lung bases are clear. There is coronary vascular calcification. No intra- abdominal free air or free fluid. Hepatobiliary: Small scattered hypodense lesions in the liver are not well characterized but appears similar to the study of is 2017 and likely represent cysts or hemangioma. The gallbladder is mildly distended. No calcified stone identified. Mild thickened appearance of the  gallbladder wall noted. Ultrasound may provide better evaluation if there is clinical concern for acute cholecystitis. Pancreas: Unremarkable. No pancreatic ductal dilatation or surrounding inflammatory changes. Spleen: Normal in size without focal abnormality. Adrenals/Urinary Tract: The adrenal glands, kidneys, and the visualized ureters appear unremarkable. The urinary bladder is predominantly collapsed. There is apparent diffuse thickening of the bladder wall which may be partly related to underdistention. Cystitis is not  excluded. Correlation with urinalysis recommended. Stomach/Bowel: There is a 1.5 cm duodenal diverticulum at the head of the pancreas. There is moderate stool throughout the colon. Small scattered distal colonic diverticula noted. There is inflammatory changes of the cecum and proximal ascending colon. Two small cecal diverticulum may be present. Findings likely represent colitis versus diverticulitis of the cecum/proximal ascending colon. Mild inflammatory changes of the terminal ileum, likely reactive. No definite mass identified, however evaluation is limited in the absence of oral contrast. Correlation with colonoscopy, as clinically indicated, recommended. There is no evidence of bowel obstruction. Normal appendix. Vascular/Lymphatic: Moderate aortoiliac atherosclerotic disease. There is a 2.3 cm infrarenal aortic ectasia. The origins of the celiac axis, SMA, IMA remain patent. No portal venous gas. Multiple mildly enlarged right lower quadrant and mesenteric lymph nodes, likely reactive. Reproductive: The prostate and seminal vesicles are grossly unremarkable. Other: None Musculoskeletal: Bilateral L5 pars defects. No listhesis. Degenerative changes of the spine primarily at L2-L3 and L4-L5. No acute osseous pathology. IMPRESSION: 1. Colitis versus diverticulitis of the cecum and proximal ascending colon. Correlation with clinical exam and further evaluation with colonoscopy following  resolution of active inflammation, and as clinically indicated, advise. 2. No bowel obstruction.  Normal appendix. 3. Thickened appearance of the gallbladder wall. No calcified gallstone. Ultrasound may provide better evaluation if there is clinical concern for acute cholecystitis. 4.  Aortic Atherosclerosis (ICD10-I70.0). 5. A 2.3 cm infrarenal abdominal aortic ectasia. Electronically Signed   By: Elgie CollardArash  Radparvar M.D.   On: 10/25/2017 00:56    Procedures Procedures (including critical care time)  Medications Ordered in ED Medications  sodium chloride 0.9 % bolus 1,000 mL (0 mLs Intravenous Stopped 10/25/17 0211)  ondansetron (ZOFRAN) injection 4 mg (4 mg Intravenous Given 10/25/17 0008)  iopamidol (ISOVUE-300) 61 % injection (100 mLs  Contrast Given 10/25/17 0015)  ciprofloxacin (CIPRO) tablet 500 mg (500 mg Oral Given 10/25/17 0131)  metroNIDAZOLE (FLAGYL) tablet 500 mg (500 mg Oral Given 10/25/17 0131)     Initial Impression / Assessment and Plan / ED Course  I have reviewed the triage vital signs and the nursing notes.  Pertinent labs & imaging results that were available during my care of the patient were reviewed by me and considered in my medical decision making (see chart for details).     3 days of vomiting and diarrhea that have been associated with lower abdominal pain.  He was nontoxic on exam with reassuring vital signs.  Tenderness across lower abdomen.  He denies any chest pain or breathing problems, EKG reassuring.  Lab work overall unremarkable with normal CBC, lipase, and LFTs.  Creatinine 1.41 which is not significantly different from previous.  Obtain CT for evaluation of diverticulitis.  CT shows colitis versus diverticulitis, recommendation of colonoscopy after resolution of symptoms.  No obvious mass.  Because the patient's symptoms have persisted and acutely worsened today, will treat for diverticulitis with Cipro and Flagyl.  He tolerated these medications as well  as liquids in the ED without any difficulty and was ambulatory, wanting to go home on reassessment.  I have extensively reviewed return precautions with the patient and his wife.  Provided with GI follow-up information and discussed need for future colonoscopy.  They voiced understanding and he was discharged in satisfactory condition. Final Clinical Impressions(s) / ED Diagnoses   Final diagnoses:  Nausea vomiting and diarrhea  Lower abdominal pain  Diverticulitis large intestine w/o perforation or abscess w/o bleeding    ED Discharge Orders  Ordered    ciprofloxacin (CIPRO) 500 MG tablet  2 times daily     10/25/17 0232    metroNIDAZOLE (FLAGYL) 500 MG tablet  2 times daily     10/25/17 0232    ondansetron (ZOFRAN ODT) 4 MG disintegrating tablet  Every 8 hours PRN     10/25/17 0232       Lalani Winkles, Ambrose Finland, MD 10/25/17 [phone]

## 2017-10-25 ENCOUNTER — Emergency Department (HOSPITAL_COMMUNITY): Payer: 59

## 2017-10-25 LAB — URINALYSIS, ROUTINE W REFLEX MICROSCOPIC
BACTERIA UA: NONE SEEN
Bilirubin Urine: NEGATIVE
Glucose, UA: NEGATIVE mg/dL
KETONES UR: NEGATIVE mg/dL
LEUKOCYTES UA: NEGATIVE
NITRITE: NEGATIVE
PROTEIN: NEGATIVE mg/dL
RBC / HPF: NONE SEEN RBC/hpf (ref 0–5)
Specific Gravity, Urine: 1.02 (ref 1.005–1.030)
Squamous Epithelial / LPF: NONE SEEN
pH: 5 (ref 5.0–8.0)

## 2017-10-25 MED ORDER — ONDANSETRON 4 MG PO TBDP
4.0000 mg | ORAL_TABLET | Freq: Three times a day (TID) | ORAL | 0 refills | Status: DC | PRN
Start: 2017-10-25 — End: 2019-03-07

## 2017-10-25 MED ORDER — METRONIDAZOLE 500 MG PO TABS
500.0000 mg | ORAL_TABLET | Freq: Once | ORAL | Status: AC
  Administered 2017-10-25: 500 mg via ORAL
  Filled 2017-10-25: qty 1

## 2017-10-25 MED ORDER — METRONIDAZOLE 500 MG PO TABS: 500.0000 mg | ORAL_TABLET | Freq: Two times a day (BID) | ORAL | 0 refills | Status: DC

## 2017-10-25 MED ORDER — CIPROFLOXACIN HCL 500 MG PO TABS
500.0000 mg | ORAL_TABLET | Freq: Once | ORAL | Status: AC
  Administered 2017-10-25: 500 mg via ORAL
  Filled 2017-10-25: qty 1

## 2017-10-25 MED ORDER — CIPROFLOXACIN HCL 500 MG PO TABS: 500.0000 mg | ORAL_TABLET | Freq: Two times a day (BID) | ORAL | 0 refills | Status: DC

## 2018-01-29 ENCOUNTER — Other Ambulatory Visit: Payer: Self-pay | Admitting: Pulmonary Disease

## 2018-02-15 ENCOUNTER — Other Ambulatory Visit: Payer: Self-pay | Admitting: Pulmonary Disease

## 2019-01-16 ENCOUNTER — Other Ambulatory Visit: Payer: Self-pay

## 2019-01-16 DIAGNOSIS — I10 Essential (primary) hypertension: Secondary | ICD-10-CM

## 2019-01-16 MED ORDER — METOPROLOL SUCCINATE ER 100 MG PO TB24: 100.0000 mg | ORAL_TABLET | Freq: Every day | ORAL | 3 refills | Status: DC

## 2019-03-07 ENCOUNTER — Encounter: Payer: Self-pay | Admitting: Cardiology

## 2019-03-09 NOTE — Progress Notes (Signed)
Virtual Visit via Video Note   Subjective:   Shawn Chang, male    DOB: [birth year], 58 y.o.   MRN: [id number]   I connected with the patient on 03/10/19 by a video enabled telemedicine application and verified that I am speaking with the correct person using two identifiers.     I discussed the limitations of evaluation and management by telemedicine and the availability of in person appointments. The patient expressed understanding and agreed to proceed.   This visit type was conducted due to national recommendations for restrictions regarding the COVID-19 Pandemic (e.g. social distancing).  This format is felt to be most appropriate for this patient at this time.  All issues noted in this document were discussed and addressed.  No physical exam was performed (except for noted visual exam findings with Tele health visits).  The patient has consented to conduct a Tele health visit and understands insurance will be billed.     Chief complaint:  Coronary artery disease  HPI  58 year old male with coronary artery disease status post CABG in 2016 (LIMA-LAD, Lt radial-OM1, SVG-OM2,OM3, SVG-PDA), prior unsuccessful PTCA to RCA, hypertension, tobacco abuse here for 6 month follow-up.  He has been doing well and denies chest pain, shortness of breath, palpitations, leg edema, orthopnea, PND, TIA/syncope. Blood pressure much better controlled now.  Past Medical History:  Diagnosis Date   Coronary artery disease    Hypertension    Myocardial infarction [facility])    Retinal detachment      Past Surgical History:  Procedure Laterality Date   CARDIAC CATHETERIZATION N/A 10/01/2015   Procedure: Left Heart Cath and Coronary Angiography;  Surgeon: Adrian Prows, MD;  Location: [place] CV LAB;  Service: Cardiovascular;  Laterality: N/A;   CARDIAC CATHETERIZATION N/A 10/08/2015   Procedure: Coronary Stent Intervention;  Surgeon: Adrian Prows, MD;  Location: [place] CV LAB;  Service:  Cardiovascular;  Laterality: N/A;   CORONARY ARTERY BYPASS GRAFT N/A 10/12/2015   Procedure: CORONARY ARTERY BYPASS GRAFTING (CABG) x five , using left internal mammary artery and bilateral legs greater saphenous vein harvested endoscopically;  Surgeon: Ivin Poot, MD;  Location: [place];  Service: Open Heart Surgery;  Laterality: N/A;   CORONARY ARTERY BYPASS GRAFT     KNEE SURGERY     PERIPHERAL VASCULAR CATHETERIZATION  10/08/2015   Procedure: Abdominal Aortogram;  Surgeon: Adrian Prows, MD;  Location: [place] CV LAB;  Service: Cardiovascular;;   PERIPHERAL VASCULAR CATHETERIZATION  10/08/2015   Procedure: Aortic Arch Angiography;  Surgeon: Adrian Prows, MD;  Location: [place] CV LAB;  Service: Cardiovascular;;   shoulder surgery     TEE WITHOUT CARDIOVERSION N/A 10/12/2015   Procedure: TRANSESOPHAGEAL ECHOCARDIOGRAM (TEE);  Surgeon: Ivin Poot, MD;  Location: [place];  Service: Open Heart Surgery;  Laterality: N/A;   TONSILLECTOMY     Pt has also had sinus and throat surgery     Social History   Socioeconomic History   Marital status: Married    Spouse name: Not on file   Number of children: 2   Years of education: Not on file   Highest education level: Not on file  Occupational History   Not on file  Social Needs   Financial resource strain: Not on file   Food insecurity:    Worry: Not on file    Inability: Not on file   Transportation needs:    Medical: Not on file    Non-medical: Not on file  Tobacco Use   Smoking status: Former Smoker    Packs/day: 1.50    Years: 39.00    Pack years: 58.50    Types: Cigarettes    Last attempt to quit: 08/31/2015    Years since quitting: 3.5   Smokeless tobacco: Never Used  Substance and Sexual Activity   Alcohol use: Yes    Alcohol/week: 0.0 standard drinks    Comment: 1-3 beers per week   Drug use: No   Sexual activity: Not on file  Lifestyle   Physical activity:    Days per week: Not on file     Minutes per session: Not on file   Stress: Not on file  Relationships   Social connections:    Talks on phone: Not on file    Gets together: Not on file    Attends religious service: Not on file    Active member of club or organization: Not on file    Attends meetings of clubs or organizations: Not on file    Relationship status: Not on file   Intimate partner violence:    Fear of current or ex partner: Not on file    Emotionally abused: Not on file    Physically abused: Not on file    Forced sexual activity: Not on file  Other Topics Concern   Not on file  Social History Narrative   Married, lives with spouse   2 children   Geneticist, 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     aspirin EC 81 MG tablet Take 81 mg by mouth daily.     atorvastatin (LIPITOR) 40 MG tablet Take 40 mg by mouth daily.     cetirizine (ZYRTEC) 10 MG tablet Take 10 mg by mouth every morning.      losartan (COZAAR) 50 MG tablet Take 100 mg by mouth daily.     metoprolol succinate (TOPROL-XL) 100 MG 24 hr tablet Take 1 tablet (100 mg total) by mouth daily. Take with or immediately following a meal. 90 tablet 3   nitroGLYCERIN (NITROSTAT) 0.4 MG SL tablet Place 0.4 mg under the tongue every 5 (five) minutes as needed for chest pain.     omeprazole (PRILOSEC) 10 MG capsule Take 10 mg by mouth daily.     QVAR 80 MCG/ACT inhaler INHALE 2 PUFFS INTO THE LUNGS 2 (TWO) TIMES DAILY. (Patient taking differently: Inhale 2 puffs into the lungs daily. ) 8.7 g 2   VENTOLIN HFA 108 (90  Base) MCG/ACT inhaler INHALE 2 PUFFS INTO THE LUNGS EVERY 6 (SIX) HOURS AS NEEDED FOR WHEEZING OR SHORTNESS OF BREATH. 18 Inhaler 0   No current facility-administered medications on file prior to visit.     Cardiovascular studies:  EKG 05/31/2018: Sinus rhythm 56 bpm. Old inferior infarct. [place] normal EKG.   Exercise sestamibi stress test 09/20/2018:  1. The patient performed treadmill exercise using Bruce protocol, completing 6:30 minutes. The patient  completed an estimated workload of 7.7 METS, reaching 99% of the maximum predicted heart rate. Normal hemodynamic response was seen. Stress symptoms included dyspnea. Exercise capacity was low.  No ischemic changes seen on stress electrocardiogram.  2. The overall quality of the study is good.  Left ventricular cavity is noted to be normal on the rest and stress studies.  Gated SPECT images reveal normal myocardial thickening and wall motion.  The left ventricular ejection fraction was calculated or visually estimated to be 57%. Small size, mild intensity, reversible perfusion defect in mid to basal inferior myocardium suggestive of ischemia.  3. Low risk study.  Echocardiogram 09/28/2015: 1. Left ventricle cavity is normal in size. Moderate concentric hypertrophy of the left ventricle. Normal global wall motion. Calculated EF 64%. 2. Mild to moderate aortic regurgitation. Mild aortic valve leaflet thickening. 3. Trace mitral regurgitation. 4. Trace tricuspid regurgitation.   Recent labs: 09/16/2018: H/H 16/48. MCV 89. Platelets 215 Glucose 94. BUN/Cr 22/1.39. eGFR 56. Na/K 144/4.5 TSH normal Chol 81, TG 70, HDL 33, LDL 34.    Review of Systems  Constitution: Negative for decreased appetite, malaise/fatigue, weight gain and weight loss.  HENT: Negative for congestion.   Eyes: Negative for visual disturbance.  Cardiovascular: Negative for chest pain, dyspnea on exertion, leg swelling, palpitations and syncope.  Respiratory:  Negative for shortness of breath.   Endocrine: Negative for cold intolerance.  Hematologic/Lymphatic: Does not bruise/bleed easily.  Skin: Negative for itching and rash.  Musculoskeletal: Negative for myalgias.  Gastrointestinal: Negative for abdominal pain, nausea and vomiting.  Genitourinary: Negative for dysuria.  Neurological: Negative for dizziness and weakness.  Psychiatric/Behavioral: The patient is not nervous/anxious.   All other systems reviewed and are negative.        Vitals:   03/10/19 0952  BP: (!) 141/75  Pulse: 67   (Measured by the patient using a home BP monitor)   Observation/findings during video visit   Objective:    Physical Exam  Constitutional: He is oriented to person, place, and time. He appears well-developed and well-nourished. No distress.  Pulmonary/Chest: Effort normal.  Neurological: He is alert and oriented to person, place, and time.  Psychiatric: He has a normal mood and affect.  Nursing note and vitals reviewed.         Assessment & Recommendations:   58 year old male with coronary artery disease status post CABG in 2016 (LIMA-LAD, Lt radial-OM1, SVG-OM2,OM3, SVG-PDA), prior unsuccessful PTCA to RCA, hypertension, tobacco abuse here for 6 month follow-up.  CAD: Atypical chest pain, exertional dyspnea. With his complex coronary artery disease history and his intention to start high intensity exercise I recommend exercise nuclear stress test.  Hypertension: Better controlled. Continue amlodiine 5 mg daily, losartan 100 mg daily, metoprolol xL 100 mg daily.   Repeat lipid panel and BMP in 6 months. Follow up after that.  Nigel Mormon, MD [facility] [place] Pager: [phone] Office: [phone] If no answer Cell [phone]

## 2019-03-10 ENCOUNTER — Encounter: Payer: Self-pay | Admitting: Cardiology

## 2019-03-10 ENCOUNTER — Other Ambulatory Visit: Payer: Self-pay

## 2019-03-10 ENCOUNTER — Ambulatory Visit (INDEPENDENT_AMBULATORY_CARE_PROVIDER_SITE_OTHER): Payer: 59 | Admitting: Cardiology

## 2019-03-10 VITALS — BP 141/75 | HR 67 | Ht 66.0 in | Wt 180.0 lb

## 2019-03-10 DIAGNOSIS — I2510 Atherosclerotic heart disease of native coronary artery without angina pectoris: Secondary | ICD-10-CM

## 2019-03-10 DIAGNOSIS — I10 Essential (primary) hypertension: Secondary | ICD-10-CM

## 2019-03-10 MED ORDER — NITROGLYCERIN 0.4 MG SL SUBL: 0.4000 mg | SUBLINGUAL_TABLET | SUBLINGUAL | 2 refills | Status: DC | PRN

## 2019-03-14 ENCOUNTER — Ambulatory Visit: Payer: Self-pay | Admitting: Cardiology

## 2019-05-27 ENCOUNTER — Other Ambulatory Visit: Payer: Self-pay | Admitting: Cardiology

## 2019-05-27 DIAGNOSIS — I10 Essential (primary) hypertension: Secondary | ICD-10-CM

## 2019-05-30 ENCOUNTER — Other Ambulatory Visit: Payer: Self-pay

## 2019-05-30 MED ORDER — ATORVASTATIN CALCIUM 40 MG PO TABS: 40.0000 mg | ORAL_TABLET | Freq: Every day | ORAL | 0 refills | Status: DC

## 2019-06-12 ENCOUNTER — Other Ambulatory Visit: Payer: Self-pay

## 2019-06-12 DIAGNOSIS — I10 Essential (primary) hypertension: Secondary | ICD-10-CM

## 2019-06-12 MED ORDER — LOSARTAN POTASSIUM 100 MG PO TABS: 100.0000 mg | ORAL_TABLET | Freq: Every day | ORAL | 3 refills | Status: DC

## 2019-08-23 ENCOUNTER — Other Ambulatory Visit: Payer: Self-pay | Admitting: Cardiology

## 2019-09-03 ENCOUNTER — Encounter: Payer: Self-pay | Admitting: Cardiology

## 2019-09-03 ENCOUNTER — Other Ambulatory Visit: Payer: Self-pay

## 2019-09-03 ENCOUNTER — Ambulatory Visit (INDEPENDENT_AMBULATORY_CARE_PROVIDER_SITE_OTHER): Payer: 59 | Admitting: Cardiology

## 2019-09-03 VITALS — BP 142/90 | HR 70 | Temp 97.8°F | Ht 66.0 in | Wt 178.9 lb

## 2019-09-03 DIAGNOSIS — I2510 Atherosclerotic heart disease of native coronary artery without angina pectoris: Secondary | ICD-10-CM | POA: Diagnosis not present

## 2019-09-03 LAB — BASIC METABOLIC PANEL
BUN/Creatinine Ratio: 11 (ref 9–20)
BUN: 16 mg/dL (ref 6–24)
CO2: 24 mmol/L (ref 20–29)
Calcium: 9.6 mg/dL (ref 8.7–10.2)
Chloride: 103 mmol/L (ref 96–106)
Creatinine, Ser: 1.48 mg/dL — ABNORMAL HIGH (ref 0.76–1.27)
GFR calc Af Amer: 59 mL/min/{1.73_m2} — ABNORMAL LOW (ref >59)
GFR calc non Af Amer: 51 mL/min/{1.73_m2} — ABNORMAL LOW (ref >59)
Glucose: 97 mg/dL (ref 65–99)
Potassium: 4.5 mmol/L (ref 3.5–5.2)
Sodium: 142 mmol/L (ref 134–144)

## 2019-09-03 LAB — LIPID PANEL
Chol/HDL Ratio: 2.4 ratio (ref 0.0–5.0)
Cholesterol, Total: 78 mg/dL — ABNORMAL LOW (ref 100–199)
HDL: 32 mg/dL — ABNORMAL LOW (ref >39)
LDL Chol Calc (NIH): 30 mg/dL (ref 0–99)
Triglycerides: 77 mg/dL (ref 0–149)
VLDL Cholesterol Cal: 16 mg/dL (ref 5–40)

## 2019-09-03 MED ORDER — NITROGLYCERIN 0.4 MG SL SUBL: 0.4000 mg | SUBLINGUAL_TABLET | SUBLINGUAL | 2 refills | Status: DC | PRN

## 2019-09-03 NOTE — Progress Notes (Signed)
Subjective:   Shawn Chang, male    DOB: 1961-04-07, 58 y.o.   MRN: [id number]    Chief complaint:  Coronary artery disease  58 year old male with coronary artery disease status post CABG in 2016 (LIMA-LAD, Lt radial-OM1, SVG-OM2,OM3, SVG-PDA), prior unsuccessful PTCA to RCA, hypertension, CKD 3, tobacco abuse here for 6 month follow-up.  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 He denies chest pain with exertion, shortness of breath, palpitations, leg edema, orthopnea, PND, TIA/syncope. He has occasional chest pain at rest. He is looking forward to this hunting season.  On a separate note, he has decreased libido, but does not want to make any changes to his medications, or add sildenafil at this time.   Past Medical History:  Diagnosis Date  . Acid reflux   . Asthma   . Coronary artery disease   . Hyperlipidemia   . Hypertension   . Myocardial infarction ([place])   . Retinal detachment      Past Surgical History:  Procedure Laterality Date  . CARDIAC CATHETERIZATION N/A 10/01/2015   Procedure: Left Heart Cath and Coronary Angiography;  Surgeon: Adrian Prows, MD;  Location: [place] [facility];  Service: Cardiovascular;  Laterality: N/A;  . CARDIAC CATHETERIZATION N/A 10/08/2015   Procedure: Coronary Stent Intervention;  Surgeon: Adrian Prows, MD;  Location: [place] [facility];  Service: Cardiovascular;  Laterality: N/A;  . CORONARY ARTERY BYPASS GRAFT N/A 10/12/2015   Procedure: CORONARY ARTERY BYPASS GRAFTING (CABG) x five , using left internal mammary artery and bilateral legs greater saphenous vein harvested endoscopically;  Surgeon: Ivin Poot, MD;  Location: [place];  Service: Open Heart Surgery;  Laterality: N/A;  . CORONARY ARTERY BYPASS GRAFT    . KNEE SURGERY    . PERIPHERAL VASCULAR CATHETERIZATION  10/08/2015   Procedure: Abdominal Aortogram;  Surgeon: Adrian Prows, MD;  Location: [place] [facility];  Service: Cardiovascular;;  . PERIPHERAL  VASCULAR CATHETERIZATION  10/08/2015   Procedure: Aortic Arch Angiography;  Surgeon: Adrian Prows, MD;  Location: [place] [facility];  Service: Cardiovascular;;  . shoulder surgery    . TEE WITHOUT CARDIOVERSION N/A 10/12/2015   Procedure: TRANSESOPHAGEAL ECHOCARDIOGRAM (TEE);  Surgeon: Ivin Poot, MD;  Location: [place];  Service: Open Heart Surgery;  Laterality: N/A;  . TONSILLECTOMY     Pt has also had sinus and throat surgery     Social History   Socioeconomic History  . Marital status: Married    Spouse name: Not on file  . Number of children: 2  . Years of education: Not on file  . Highest education level: Not on file  Occupational History  . Not on file  Social Needs  . Financial resource strain: Not on file  . Food insecurity    Worry: Not on file    Inability: Not on file  . Transportation needs    Medical: Not on file    Non-medical: Not on file  Tobacco Use  . Smoking status: Former Smoker    Packs/day: 1.50    Years: 39.00    Pack years: 58.50    Types: Cigarettes    Quit date: 08/31/2015    Years since quitting: 4.0  . Smokeless tobacco: Never Used  Substance and Sexual Activity  . Alcohol use: Yes    Alcohol/week: 0.0 standard drinks    Comment: 1-3 beers per week  . Drug use: No  . Sexual activity: Not on file  Lifestyle  .  Physical activity    Days per week: Not on file    Minutes per session: Not on file  . Stress: Not on file  Relationships  . Social Herbalist on phone: Not on file    Gets together: Not on file    Attends religious service: Not on file    Active member of club or organization: Not on file    Attends meetings of clubs or organizations: Not on file    Relationship status: Not on file  . Intimate partner violence    Fear of current or ex partner: Not on file    Emotionally abused: Not on file    Physically abused: Not on file    Forced sexual activity: Not on file  Other Topics Concern  . 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 Heart disease Mother        May 2017  . Heart disease Father   . Hypertension Sister   . Stroke Sister   . Heart attack Brother        massive, survived  . Heart attack Brother      Current Outpatient Medications on File Prior to Visit  Medication Sig Dispense Refill  . amLODipine (NORVASC) 5 MG tablet TAKE 1 TABLET BY MOUTH EVERY DAY 90 tablet 1  . aspirin EC 81 MG tablet Take 81 mg by mouth daily.    Marland Kitchen atorvastatin (LIPITOR) 40 MG tablet TAKE 1 TABLET BY MOUTH EVERY DAY 90 tablet 0  . cetirizine (ZYRTEC) 10 MG tablet Take 10 mg by mouth every morning.     Marland Kitchen losartan (COZAAR) 100 MG tablet Take 1 tablet (100 mg total) by mouth daily. 90 tablet 3  . metoprolol succinate (TOPROL-XL) 100 MG 24 hr tablet Take 1 tablet (100 mg total) by mouth daily. Take with or immediately following a meal. 90 tablet 3  . nitroGLYCERIN (NITROSTAT) 0.4 MG SL tablet Place 1 tablet (0.4 mg total) under the tongue every 5 (five) minutes as needed for chest pain. 30 tablet 2  . omeprazole (PRILOSEC) 10 MG capsule Take 10 mg by mouth daily.    Marland Kitchen QVAR 80 MCG/ACT inhaler INHALE 2 PUFFS INTO THE LUNGS 2 (TWO) TIMES DAILY. (Patient taking differently: Inhale 2 puffs into the lungs daily. ) 8.7 g 2  . VENTOLIN HFA 108 (90 Base) MCG/ACT inhaler INHALE 2 PUFFS INTO THE LUNGS EVERY 6 (SIX) HOURS AS NEEDED FOR WHEEZING OR SHORTNESS OF BREATH. 18 Inhaler 0   No current facility-administered medications on file prior to visit.     Cardiovascular studies:  EKG 09/03/2019: Sinus rhythm 70 bpm.  Right bundle branch block.  Old inferior infarct.   Exercise sestamibi stress test 09/20/2018:  1. The patient performed treadmill exercise using Bruce protocol, completing 6:30 minutes. The patient completed an estimated  workload of 7.7 METS, reaching 99% of the maximum predicted heart rate. Normal hemodynamic response was seen. Stress symptoms included dyspnea. Exercise capacity was low.  No ischemic changes seen on stress electrocardiogram.  2. The overall quality of the study is good.  Left ventricular cavity is noted to be normal on the rest and stress studies.  Gated SPECT images reveal normal myocardial thickening and wall motion.  The left ventricular  ejection fraction was calculated or visually estimated to be 57%. Small size, mild intensity, reversible perfusion defect in mid to basal inferior myocardium suggestive of ischemia.  3. Low risk study.  Echocardiogram 09/28/2015: 1. Left ventricle cavity is normal in size. Moderate concentric hypertrophy of the left ventricle. Normal global wall motion. Calculated EF 64%. 2. Mild to moderate aortic regurgitation. Mild aortic valve leaflet thickening. 3. Trace mitral regurgitation. 4. Trace tricuspid regurgitation.   Recent labs: 09/02/2019: Glucose 97. BUN/Cr 16/1.48. eGFR 51. Na/K 142/4.5 Chol 78, TG 77, HDL 32, LDL 30  09/16/2018: H/H 16/48. MCV 89. Platelets 215 Glucose 94. BUN/Cr 22/1.39. eGFR 56. Na/K 144/4.5 TSH normal Chol 81, TG 70, HDL 33, LDL 34.    Review of Systems  Constitution: Negative for decreased appetite, malaise/fatigue, weight gain and weight loss.  HENT: Negative for congestion.   Eyes: Negative for visual disturbance.  Cardiovascular: Negative for chest pain, dyspnea on exertion, leg swelling, palpitations and syncope.  Respiratory: Negative for shortness of breath.   Endocrine: Negative for cold intolerance.  Hematologic/Lymphatic: Does not bruise/bleed easily.  Skin: Negative for itching and rash.  Musculoskeletal: Negative for myalgias.  Gastrointestinal: Negative for abdominal pain, nausea and vomiting.  Genitourinary: Negative for dysuria.  Neurological: Negative for dizziness and weakness.   Psychiatric/Behavioral: The patient is not nervous/anxious.   All other systems reviewed and are negative.        Vitals:   09/03/19 1528 09/03/19 1607  BP: (!) 145/92 (!) 142/90  Pulse: 75 70  Temp: 97.8 F (36.6 C)   SpO2: 99%      Objective:    Physical Exam  Constitutional: He is oriented to person, place, and time. He appears well-developed and well-nourished. No distress.  Pulmonary/Chest: Effort normal.  Neurological: He is alert and oriented to person, place, and time.  Psychiatric: He has a normal mood and affect.  Nursing note and vitals reviewed.         Assessment & Recommendations:   58 year old male with coronary artery disease status post CABG in 2016 (LIMA-LAD, Lt radial-OM1, SVG-OM2,OM3, SVG-PDA), prior unsuccessful PTCA to RCA, hypertension, CKD 3, tobacco abuse here for 6 month follow-up.  CAD: No angina symptoms. Occasional nonanginal chest pain. Prior low risk stress test in 08/2018. Continue current medical therapy, except increase in amlodipine to 7./5 mg daily. Patient will notify about tolerance.   Hypertension: As above.  On a separate note, he has decreased libido, but does not want to make any changes to his medications, or add sildenafil at this time. Encouraged counseling.  F/u in 6 months and 12 months. Repeat BMP and lipid panel in 1 year.   Repeat lipid panel and BMP in 6 months. Follow up after that.  Nigel Mormon, MD [facility] Pager: [phone] Office: [phone] If no answer Cell [phone]

## 2019-09-05 ENCOUNTER — Ambulatory Visit: Payer: 59 | Admitting: Cardiology

## 2019-11-27 ENCOUNTER — Other Ambulatory Visit: Payer: Self-pay

## 2019-11-27 DIAGNOSIS — I10 Essential (primary) hypertension: Secondary | ICD-10-CM

## 2019-11-27 MED ORDER — ATORVASTATIN CALCIUM 40 MG PO TABS: 40.0000 mg | ORAL_TABLET | Freq: Every day | ORAL | 0 refills | Status: DC

## 2019-11-27 MED ORDER — AMLODIPINE BESYLATE 5 MG PO TABS: 5.0000 mg | ORAL_TABLET | Freq: Every day | ORAL | 0 refills | Status: DC

## 2019-11-28 ENCOUNTER — Other Ambulatory Visit: Payer: Self-pay | Admitting: Cardiology

## 2019-11-28 DIAGNOSIS — I10 Essential (primary) hypertension: Secondary | ICD-10-CM

## 2020-02-03 ENCOUNTER — Other Ambulatory Visit: Payer: Self-pay

## 2020-02-03 DIAGNOSIS — I10 Essential (primary) hypertension: Secondary | ICD-10-CM

## 2020-02-03 MED ORDER — METOPROLOL SUCCINATE ER 100 MG PO TB24: 100.0000 mg | ORAL_TABLET | Freq: Every day | ORAL | 3 refills | Status: DC

## 2020-03-13 NOTE — Progress Notes (Signed)
Follow up visit  Subjective:   Shawn Chang, male    DOB: 1960-11-12, 59 y.o.   MRN: [id number]   HPI   Chief Complaint  Patient presents with  . Coronary Artery Disease    6 months  . Follow-up    59 year old Caucasian male with coronary artery disease status post CABG in 2016 (LIMA-LAD, Lt radial-OM1, SVG-OM2,OM3, SVG-PDA), prior unsuccessful PTCA to RCA, hypertension, CKD 3, tobacco abuse here for 6 month follow-up.  Patient is here for 6 month follow up. At last office visit, I increased his Amlodipine dose to 7.5 mg. However, he has only been taking 5 mg daily. In spite that, his blood pressure is well controlled.  He denies chest pain, shortness of breath, palpitations, leg edema, orthopnea, PND, TIA/syncope. He has chest tightness and wheezing only while climbing up a hull during his hunting trips on cold days. He uses as needed albuterol for that. He does not need to use it otherwise.   Current Outpatient Medications on File Prior to Visit  Medication Sig Dispense Refill  . amLODipine (NORVASC) 5 MG tablet TAKE 1 TABLET BY MOUTH EVERY DAY 90 tablet 1  . aspirin EC 81 MG tablet Take 81 mg by mouth daily.    . cetirizine (ZYRTEC) 10 MG tablet Take 10 mg by mouth every morning.     Marland Kitchen losartan (COZAAR) 100 MG tablet Take 1 tablet (100 mg total) by mouth daily. 90 tablet 3  . metoprolol succinate (TOPROL-XL) 100 MG 24 hr tablet Take 1 tablet (100 mg total) by mouth daily. Take with or immediately following a meal. 90 tablet 3  . nitroGLYCERIN (NITROSTAT) 0.4 MG SL tablet Place 1 tablet (0.4 mg total) under the tongue every 5 (five) minutes as needed for chest pain. 30 tablet 2  . omeprazole (PRILOSEC) 10 MG capsule Take 10 mg by mouth daily.    Marland Kitchen QVAR 80 MCG/ACT inhaler INHALE 2 PUFFS INTO THE LUNGS 2 (TWO) TIMES DAILY. (Patient taking differently: Inhale 2 puffs into the lungs daily. ) 8.7 g 2  . VENTOLIN HFA 108 (90 Base) MCG/ACT inhaler INHALE 2 PUFFS INTO THE LUNGS EVERY 6  (SIX) HOURS AS NEEDED FOR WHEEZING OR SHORTNESS OF BREATH. 18 Inhaler 0  . atorvastatin (LIPITOR) 40 MG tablet TAKE 1 TABLET BY MOUTH EVERY DAY 90 tablet 2   No current facility-administered medications on file prior to visit.    Cardiovascular & other pertient studies:  EKG 03/15/2020: Sinus rhythm 72 bpm. Right bundle branch block.  Old inferior infarct.  No change compared to previous EKG on 09/03/2019.  Exercise sestamibi stress test 09/20/2018:  1. The patient performed treadmill exercise using Bruce protocol, completing 6:30 minutes. The patient completed an estimated workload of 7.7 METS, reaching 99% of the maximum predicted heart rate. Normal hemodynamic response was seen. Stress symptoms included dyspnea. Exercise capacity was low.  No ischemic changes seen on stress electrocardiogram.  2. The overall quality of the study is good.  Left ventricular cavity is noted to be normal on the rest and stress studies.  Gated SPECT images reveal normal myocardial thickening and wall motion.  The left ventricular ejection fraction was calculated or visually estimated to be 57%. Small size, mild intensity, reversible perfusion defect in mid to basal inferior myocardium suggestive of ischemia.  3. Low risk study.  Echocardiogram 09/28/2015: 1. Left ventricle cavity is normal in size. Moderate concentric hypertrophy of the left ventricle.  Normal global wall motion. Calculated EF 64%.  2. Mild to moderate aortic regurgitation. Mild aortic valve leaflet thickening. 3. Trace mitral regurgitation. 4. Trace tricuspid regurgitation.  Recent labs: 09/02/2019: Glucose 97. BUN/Cr 16/1.48. eGFR 51. Na/K 142/4.5 Chol 78, TG 77, HDL 32, LDL 30  09/16/2018: H/H 16/48. MCV 89. Platelets 215 Glucose 94. BUN/Cr 22/1.39. eGFR 56. Na/K 144/4.5 TSH normal Chol 81, TG 70, HDL 33, LDL 34.      Review of Systems  Cardiovascular: Negative for chest pain, dyspnea on exertion, leg swelling, palpitations and  syncope.         Vitals:   03/15/20 0930  BP: 131/83  Pulse: 74  Temp: 97.9 F (36.6 C)  SpO2: 96%     Body mass index is 28.89 kg/m. Filed Weights   03/15/20 0930  Weight: 179 lb (81.2 kg)     Objective:   Physical Exam  Constitutional: No distress.  Neck: No JVD present.  Cardiovascular: Normal rate, regular rhythm, normal heart sounds and intact distal pulses.  No murmur heard. Pulmonary/Chest: Effort normal and breath sounds normal. He has no wheezes. He has no rales.  Musculoskeletal:        General: No edema.  Nursing note and vitals reviewed.         Assessment & Recommendations:   59 year old Caucasian male with coronary artery disease status post CABG in 2016 (LIMA-LAD, Lt radial-OM1, SVG-OM2,OM3, SVG-PDA), prior unsuccessful PTCA to RCA, hypertension, CKD 3, tobacco abuse here for 6 month follow-up.  CAD: No angina symptoms.  Continue Aspirin, statin, current antihypertensive therapy.  Hypertension: Well controlled.  He likely has exercise induced asthma. Refilled albuterol. Recommend f/u w/pulmonology before next hunting season.  I will see him back in 1 year.    Nigel Mormon, MD [facility] Pager: [phone] Office: [phone]

## 2020-03-15 ENCOUNTER — Other Ambulatory Visit: Payer: Self-pay

## 2020-03-15 ENCOUNTER — Ambulatory Visit: Payer: 59 | Admitting: Cardiology

## 2020-03-15 ENCOUNTER — Encounter: Payer: Self-pay | Admitting: Cardiology

## 2020-03-15 DIAGNOSIS — I10 Essential (primary) hypertension: Secondary | ICD-10-CM

## 2020-03-15 DIAGNOSIS — J45990 Exercise induced bronchospasm: Secondary | ICD-10-CM

## 2020-03-15 DIAGNOSIS — R062 Wheezing: Secondary | ICD-10-CM | POA: Insufficient documentation

## 2020-03-15 DIAGNOSIS — I2510 Atherosclerotic heart disease of native coronary artery without angina pectoris: Secondary | ICD-10-CM

## 2020-03-15 MED ORDER — ALBUTEROL SULFATE HFA 108 (90 BASE) MCG/ACT IN AERS: 2.0000 | INHALATION_SPRAY | Freq: Four times a day (QID) | RESPIRATORY_TRACT | 2 refills | Status: DC | PRN

## 2020-06-01 ENCOUNTER — Other Ambulatory Visit: Payer: Self-pay | Admitting: Cardiology

## 2020-06-01 DIAGNOSIS — I10 Essential (primary) hypertension: Secondary | ICD-10-CM

## 2020-08-27 ENCOUNTER — Other Ambulatory Visit: Payer: Self-pay | Admitting: Cardiology

## 2020-08-27 DIAGNOSIS — I10 Essential (primary) hypertension: Secondary | ICD-10-CM

## 2020-09-10 ENCOUNTER — Ambulatory Visit: Payer: 59 | Admitting: Cardiology

## 2020-09-21 ENCOUNTER — Other Ambulatory Visit: Payer: Self-pay | Admitting: Cardiology

## 2020-09-21 DIAGNOSIS — I2510 Atherosclerotic heart disease of native coronary artery without angina pectoris: Secondary | ICD-10-CM

## 2020-11-24 ENCOUNTER — Other Ambulatory Visit: Payer: Self-pay | Admitting: Cardiology

## 2021-01-05 ENCOUNTER — Encounter: Payer: Self-pay | Admitting: Cardiology

## 2021-01-23 ENCOUNTER — Other Ambulatory Visit: Payer: Self-pay | Admitting: Cardiology

## 2021-01-23 DIAGNOSIS — I10 Essential (primary) hypertension: Secondary | ICD-10-CM

## 2021-02-10 ENCOUNTER — Encounter: Payer: Self-pay | Admitting: Cardiology

## 2021-02-10 ENCOUNTER — Ambulatory Visit: Payer: 59

## 2021-02-10 ENCOUNTER — Ambulatory Visit: Payer: 59 | Admitting: Cardiology

## 2021-02-10 ENCOUNTER — Other Ambulatory Visit: Payer: Self-pay

## 2021-02-10 VITALS — BP 139/90 | HR 73 | Temp 98.0°F | Resp 16 | Ht 66.0 in | Wt 183.0 lb

## 2021-02-10 DIAGNOSIS — R0601 Orthopnea: Secondary | ICD-10-CM

## 2021-02-10 DIAGNOSIS — J45990 Exercise induced bronchospasm: Secondary | ICD-10-CM | POA: Insufficient documentation

## 2021-02-10 DIAGNOSIS — I10 Essential (primary) hypertension: Secondary | ICD-10-CM

## 2021-02-10 DIAGNOSIS — I25118 Atherosclerotic heart disease of native coronary artery with other forms of angina pectoris: Secondary | ICD-10-CM

## 2021-02-10 MED ORDER — NITROGLYCERIN 0.4 MG SL SUBL: 0.4000 mg | SUBLINGUAL_TABLET | SUBLINGUAL | 3 refills | Status: DC | PRN

## 2021-02-10 NOTE — Progress Notes (Signed)
Follow up visit  Subjective:   Shawn Chang, male    DOB: January 02, 1961, 60 y.o.   MRN: [id number]   HPI   Chief Complaint  Patient presents with  . Chest Pain    Week ago for about an hour  . Coronary Artery Disease  . Shortness of Breath    60 year old Caucasian male with coronary artery disease status post CABG in 2016 (LIMA-LAD, Lt radial-OM1, SVG-OM2,OM3, SVG-PDA), prior unsuccessful PTCA to RCA, hypertension, CKD 3, tobacco abuse here for 6 month follow-up.  Patient had an episode of chest pressure last week. Patient was sleeping, when he woke up with chest pressure at 1 AM. Episode lasted for about an hour, and resolved on its own. Since then, he has done physical activity like lifting and moving a heavy couch without any chest pain. Patient reports that he had had ten chicken wings and beer the night before. On a separate note, he also reports shortness of breath on more than usual activity.   Current Outpatient Medications on File Prior to Visit  Medication Sig Dispense Refill  . albuterol (VENTOLIN HFA) 108 (90 Base) MCG/ACT inhaler Inhale 2 puffs into the lungs every 6 (six) hours as needed for wheezing or shortness of breath. 18 g 2  . amLODipine (NORVASC) 5 MG tablet TAKE 1 TABLET BY MOUTH EVERY DAY 90 tablet 1  . aspirin EC 81 MG tablet Take 81 mg by mouth daily.    Marland Kitchen atorvastatin (LIPITOR) 40 MG tablet TAKE 1 TABLET BY MOUTH EVERY DAY 90 tablet 1  . cetirizine (ZYRTEC) 10 MG tablet Take 10 mg by mouth every morning.     Marland Kitchen losartan (COZAAR) 100 MG tablet TAKE 1 TABLET BY MOUTH EVERY DAY 90 tablet 3  . metoprolol succinate (TOPROL-XL) 100 MG 24 hr tablet TAKE 1 TABLET BY MOUTH DAILY. TAKE WITH OR IMMEDIATELY FOLLOWING A MEAL 90 tablet 1  . nitroGLYCERIN (NITROSTAT) 0.4 MG SL tablet PLACE 1 TABLET (0.4 MG TOTAL) UNDER THE TONGUE EVERY 5 (FIVE) MINUTES AS NEEDED FOR CHEST PAIN. 25 tablet 3  . omeprazole (PRILOSEC) 10 MG capsule Take 10 mg by mouth daily.     No current  facility-administered medications on file prior to visit.    Cardiovascular & other pertient studies:  EKG 02/10/2021: Sinus rhythm 67 bpm Right bundle branch block Old inferior infarct No change compared to previous EKG on 03/05/2020.  Exercise sestamibi stress test 09/20/2018:  1. The patient performed treadmill exercise using Bruce protocol, completing 6:30 minutes. The patient completed an estimated workload of 7.7 METS, reaching 99% of the maximum predicted heart rate. Normal hemodynamic response was seen. Stress symptoms included dyspnea. Exercise capacity was low.  No ischemic changes seen on stress electrocardiogram.  2. The overall quality of the study is good.  Left ventricular cavity is noted to be normal on the rest and stress studies.  Gated SPECT images reveal normal myocardial thickening and wall motion.  The left ventricular ejection fraction was calculated or visually estimated to be 57%. Small size, mild intensity, reversible perfusion defect in mid to basal inferior myocardium suggestive of ischemia.  3. Low risk study.  Op note 10/13/2015: Coronary artery bypass grafting x5 (LIMA-LAD, lt radial-OM1, seq SVG-OM2,OM3, SVG-PDA)  Coronary intervention 10/08/2015:  Mid RCA lesion, 99% stenosed. Post intervention, there is a 99% residual stenosis.   1. Mid RCA lesion, 99% stenosed. The lesion is type C Diffuse.  . Angioplasty: Failed attempt. The pre-interventional distal flow is normal (  TIMI 3). The post-interventional distal flow is decreased (TIMI 2). the intervention was unsuccessful At this lesion, a dissection occurred. IC nitroglycerin was given.   The lesion difficult to cross with Couger Xt, Then used a [employer], but led to proximal ostial RCA. There was timi 1 flow. 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 Right femoral arterial access obtained for potential use of intra-aortic balloon pump. As we waited for surgical team to arrive, ST segments were back to baseline, patient completely asymptomatic, remained hemodynamically stable. Repeat right coronary angiography reveals TIMI 2 flow with proximal dissection. Hence felt urgent surgical revascularization in view of patient being on [place] was not advisable, however the surgical team will continue to follow him in case he needs emergent CABG. Dr. Tharon Aquas Trigt and Dr. Lilly Cove both present and reviewed the angiograms.    2. Ascending aorta shows mild aortic root dilatation without aneurysm formation, abdominal aorta shows diffuse distal abdominal aortic calcification and tortuosity, right external iliac artery appears to show at least 4 or greater percent stenosis.  Echocardiogram 09/28/2015: 1. Left ventricle cavity is normal in size. Moderate concentric hypertrophy of the left ventricle.  Normal global wall motion. Calculated EF 64%. 2. Mild to moderate aortic regurgitation. Mild aortic valve leaflet thickening. 3. Trace mitral regurgitation. 4. Trace tricuspid regurgitation.  Recent labs: 09/02/2019: Glucose 97. BUN/Cr 16/1.48. eGFR 51. Na/K 142/4.5 Chol 78, TG 77, HDL 32, LDL 30  09/16/2018: H/H 16/48. MCV 89. Platelets 215 Glucose 94. BUN/Cr 22/1.39. eGFR 56. Na/K 144/4.5 TSH normal Chol 81, TG 70, HDL 33, LDL 34.      Review of Systems  Cardiovascular: Positive for chest pain. Negative for dyspnea on exertion, leg swelling, palpitations and syncope.        Vitals:   02/10/21 1053 02/10/21 1056  BP: (!) 151/91 139/90  Pulse: 75 73  Resp: 16   Temp: 98 F (36.7 C)   SpO2: 96%      Body mass index is 29.54 kg/m. Filed Weights   02/10/21 1053  Weight:  183 lb (83 kg)     Objective:   Physical Exam Vitals and nursing note reviewed.  Constitutional:      General: He is not in acute distress. Neck:     Vascular: No JVD.  Cardiovascular:     Rate and Rhythm: Normal rate and regular rhythm.     Pulses: Intact distal pulses.     Heart sounds: Normal heart sounds. No murmur heard.   Pulmonary:     Effort: Pulmonary effort is normal.     Breath sounds: Normal breath sounds. No wheezing or rales.  Musculoskeletal:     Right lower leg: No edema.     Left lower leg: No edema.           Assessment & Recommendations:   60 year old Caucasian male with coronary artery disease status post CABG in 2016 (LIMA-LAD, Lt radial-OM1, SVG-OM2,OM3, SVG-PDA), prior unsuccessful PTCA to RCA, hypertension, CKD 3, tobacco abuse  CAD: One episode of chest pain. Atypical angina, but has known CAD. Recommend exercise nuclear stress test, echocardiogram.  Continue Aspirin, statin, current antihypertensive therapy. Will check labs  Hypertension: Well controlled.  F/u in 4 weeks  Nigel Mormon, MD [facility] Pager: [phone] Office: [phone]

## 2021-02-22 ENCOUNTER — Other Ambulatory Visit: Payer: Self-pay | Admitting: Cardiology

## 2021-02-22 DIAGNOSIS — I10 Essential (primary) hypertension: Secondary | ICD-10-CM

## 2021-02-26 LAB — COMPREHENSIVE METABOLIC PANEL
ALT: 30 IU/L (ref 0–44)
AST: 30 IU/L (ref 0–40)
Albumin/Globulin Ratio: 1.6 (ref 1.2–2.2)
Albumin: 4.6 g/dL (ref 3.8–4.9)
Alkaline Phosphatase: 78 IU/L (ref 44–121)
BUN/Creatinine Ratio: 13 (ref 9–20)
BUN: 17 mg/dL (ref 6–24)
Bilirubin Total: 0.7 mg/dL (ref 0.0–1.2)
CO2: 21 mmol/L (ref 20–29)
Calcium: 10.1 mg/dL (ref 8.7–10.2)
Chloride: 101 mmol/L (ref 96–106)
Creatinine, Ser: 1.31 mg/dL — ABNORMAL HIGH (ref 0.76–1.27)
Globulin, Total: 2.8 g/dL (ref 1.5–4.5)
Glucose: 99 mg/dL (ref 65–99)
Potassium: 4.7 mmol/L (ref 3.5–5.2)
Sodium: 143 mmol/L (ref 134–144)
Total Protein: 7.4 g/dL (ref 6.0–8.5)
eGFR: 63 mL/min/{1.73_m2} (ref >59)

## 2021-02-26 LAB — CBC
Hematocrit: 51.1 % — ABNORMAL HIGH (ref 37.5–51.0)
Hemoglobin: 16.9 g/dL (ref 13.0–17.7)
MCH: 30.2 pg (ref 26.6–33.0)
MCHC: 33.1 g/dL (ref 31.5–35.7)
MCV: 91 fL (ref 79–97)
Platelets: 220 10*3/uL (ref 150–450)
RBC: 5.6 x10E6/uL (ref 4.14–5.80)
RDW: 12.7 % (ref 11.6–15.4)
WBC: 8.2 10*3/uL (ref 3.4–10.8)

## 2021-02-26 LAB — LIPID PANEL
Chol/HDL Ratio: 2.6 ratio (ref 0.0–5.0)
Cholesterol, Total: 87 mg/dL — ABNORMAL LOW (ref 100–199)
HDL: 34 mg/dL — ABNORMAL LOW (ref >39)
LDL Chol Calc (NIH): 38 mg/dL (ref 0–99)
Triglycerides: 70 mg/dL (ref 0–149)
VLDL Cholesterol Cal: 15 mg/dL (ref 5–40)

## 2021-02-28 ENCOUNTER — Ambulatory Visit: Payer: 59 | Admitting: Cardiology

## 2021-02-28 ENCOUNTER — Other Ambulatory Visit: Payer: Self-pay

## 2021-02-28 ENCOUNTER — Ambulatory Visit: Payer: 59

## 2021-02-28 DIAGNOSIS — I25118 Atherosclerotic heart disease of native coronary artery with other forms of angina pectoris: Secondary | ICD-10-CM

## 2021-02-28 DIAGNOSIS — R0601 Orthopnea: Secondary | ICD-10-CM

## 2021-03-07 ENCOUNTER — Other Ambulatory Visit: Payer: 59

## 2021-03-18 ENCOUNTER — Other Ambulatory Visit: Payer: Self-pay

## 2021-03-18 ENCOUNTER — Encounter: Payer: Self-pay | Admitting: Cardiology

## 2021-03-18 ENCOUNTER — Ambulatory Visit: Payer: 59 | Admitting: Cardiology

## 2021-03-18 VITALS — BP 133/88 | HR 76 | Temp 98.4°F | Ht 66.0 in | Wt 185.0 lb

## 2021-03-18 DIAGNOSIS — R0601 Orthopnea: Secondary | ICD-10-CM

## 2021-03-18 DIAGNOSIS — I10 Essential (primary) hypertension: Secondary | ICD-10-CM

## 2021-03-18 DIAGNOSIS — I25118 Atherosclerotic heart disease of native coronary artery with other forms of angina pectoris: Secondary | ICD-10-CM

## 2021-03-18 MED ORDER — ISOSORBIDE MONONITRATE ER 30 MG PO TB24: 30.0000 mg | ORAL_TABLET | Freq: Every day | ORAL | 3 refills | Status: DC

## 2021-03-18 MED ORDER — FUROSEMIDE 20 MG PO TABS: 20.0000 mg | ORAL_TABLET | ORAL | 3 refills | Status: DC

## 2021-03-18 NOTE — Progress Notes (Signed)
Follow up visit  Subjective:   Shawn Chang, male    DOB: 11/01/1960, 60 y.o.   MRN: [id number]   HPI   Chief Complaint  Patient presents with  . Coronary Artery Disease  . Follow-up    1 year   . Results    60 year old Caucasian male with coronary artery disease status post CABG in 2016 (LIMA-LAD, Lt radial-OM1, SVG-OM2,OM3, SVG-PDA), prior unsuccessful PTCA to RCA, hypertension, CKD 3, tobacco abuse here for 6 month follow-up.  Patient recently had one episode of exertional dyspnea while loading mulch in his truck, which is unusual for him. Prior to that, he had one episode of chest pain that woke him up from sleep. He also reports "wheezing" at night.   Discussed recent echocardiogram and stress test results with the patient, details below.   Current Outpatient Medications on File Prior to Visit  Medication Sig Dispense Refill  . albuterol (VENTOLIN HFA) 108 (90 Base) MCG/ACT inhaler Inhale 2 puffs into the lungs every 6 (six) hours as needed for wheezing or shortness of breath. 18 g 2  . amLODipine (NORVASC) 5 MG tablet TAKE 1 TABLET BY MOUTH EVERY DAY 90 tablet 1  . aspirin EC 81 MG tablet Take 81 mg by mouth daily.    Marland Kitchen atorvastatin (LIPITOR) 40 MG tablet TAKE 1 TABLET BY MOUTH EVERY DAY 90 tablet 1  . cetirizine (ZYRTEC) 10 MG tablet Take 10 mg by mouth every morning.     Marland Kitchen losartan (COZAAR) 100 MG tablet TAKE 1 TABLET BY MOUTH EVERY DAY 90 tablet 3  . metoprolol succinate (TOPROL-XL) 100 MG 24 hr tablet TAKE 1 TABLET BY MOUTH DAILY. TAKE WITH OR IMMEDIATELY FOLLOWING A MEAL 90 tablet 1  . nitroGLYCERIN (NITROSTAT) 0.4 MG SL tablet Place 1 tablet (0.4 mg total) under the tongue every 5 (five) minutes as needed for chest pain. 25 tablet 3  . omeprazole (PRILOSEC) 10 MG capsule Take 10 mg by mouth daily.     No current facility-administered medications on file prior to visit.    Cardiovascular & other pertient studies:  Exercise Sestamibi stress test  02/28/2021: Exercise nuclear stress test was performed using Bruce protocol. Patient reached 7.0 METS, and 97% of age predicted maximum heart rate. Exercise capacity was low. No chest pain reported. Heart rate and hemodynamic response were normal. Stress EKG revealed no ischemic changes. SPECT images show small sized, mild intensity, reversible perfusion defect in basal inferior/inferoseptal myocardium. Stress LVEF 61%. Low risk study.   Echocardiogram 02/28/2021:  1. Left ventricle cavity is normal in size. Mild concentric hypertrophy of  the left ventricle. Abnormal septal wall  motion due to post-operative coronary artery bypass graft. Normal LV  systolic function with EF 67%. Doppler  evidence of grade I (impaired) diastolic dysfunction, normal LAP.  2. Structurally normal trileaflet aortic valve. Moderate (Grade II) aortic  regurgitation.  3. No evidence of pulmonary hypertension.   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  Op note 10/13/2015: Coronary artery bypass grafting x5 (LIMA-LAD, lt radial-OM1, seq SVG-OM2,OM3, SVG-PDA)  Coronary intervention 10/08/2015:  Mid RCA lesion, 99% stenosed. Post intervention, there is a 99% residual stenosis.   1. Mid RCA lesion, 99% stenosed. The lesion is type C Diffuse.  . Angioplasty: Failed attempt. The pre-interventional distal flow is normal (TIMI 3). The post-interventional distal flow is decreased (TIMI 2). the intervention was unsuccessful At this lesion, a  dissection occurred. IC nitroglycerin was given.   The lesion difficult to cross with Couger Xt, Then used a [employer], but led to proximal ostial RCA. There was timi 1 flow. 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 Right femoral arterial access obtained for potential use of intra-aortic balloon pump. As we waited for surgical team to arrive, ST segments were back to baseline, patient completely asymptomatic, remained hemodynamically stable. Repeat right coronary angiography reveals TIMI 2 flow with proximal dissection. Hence felt urgent surgical revascularization in view of patient being on BRILINTA was not advisable, however the surgical team will continue to follow him in case he needs emergent CABG. Dr. Tharon Aquas Trigt and Dr. Lilly Cove both present and reviewed the angiograms.    2. Ascending aorta shows mild aortic root dilatation without aneurysm formation, abdominal aorta shows diffuse distal abdominal aortic calcification and tortuosity, right external iliac artery appears to show at least 64 or greater percent stenosis.  Recent labs: 02/25/2021: Glucose 99, BUN/Cr 17/1.3. EGFR 63. Na/K 143/4.7. Rest of the CMP normal H/H 16/51. MCV 91. Platelets 220 Chol 87, TG 70, HDL 34, LDL 38   Review of Systems  Cardiovascular: Positive for chest pain. Negative for dyspnea on exertion, leg swelling, palpitations and syncope.        Vitals:   03/18/21 1414  BP: 133/88  Pulse: 76  Temp: 98.4 F (36.9 C)  SpO2: 96%     Body mass index is 29.86 kg/m. Filed Weights   03/18/21 1414  Weight: 185 lb (83.9 kg)     Objective:   Physical Exam Vitals and nursing note reviewed.  Constitutional:      General: He is not in acute distress. Neck:     Vascular: No JVD.  Cardiovascular:     Rate and Rhythm: Normal rate and regular rhythm.     Pulses: Intact distal pulses.     Heart sounds: Normal heart sounds. No murmur heard.   Pulmonary:     Effort: Pulmonary effort is normal.     Breath sounds: Normal breath sounds. No wheezing or rales.   Musculoskeletal:     Right lower leg: No edema.     Left lower leg: No edema.           Assessment & Recommendations:   60 year old Caucasian male with coronary artery disease status post CABG in 2016 (LIMA-LAD, Lt radial-OM1, SVG-OM2,OM3, SVG-PDA), prior unsuccessful PTCA to RCA, hypertension, CKD 3, tobacco abuse  CAD: Occasional episodes of angina/angina equivalent. Mild ischemia on stress test (02/2021). He prefers continued medical treatment for now. Added Imdur 30 mg daily. Continue Aspirin, statin, current antihypertensive therapy.  Orthopnea: No profound heart failure findings on echocardiogram. Nonetheless, added lasix 20 mg daily. 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   Hypertension: Well controlled.  F/u in 8 weeks  Harlea Goetzinger Esther Hardy, MD [facility] Pager: [phone] Office: [phone]

## 2021-05-20 ENCOUNTER — Other Ambulatory Visit: Payer: Self-pay | Admitting: Cardiology

## 2021-05-20 DIAGNOSIS — I10 Essential (primary) hypertension: Secondary | ICD-10-CM

## 2021-05-27 ENCOUNTER — Ambulatory Visit: Payer: 59 | Admitting: Cardiology

## 2021-05-27 ENCOUNTER — Encounter: Payer: Self-pay | Admitting: Cardiology

## 2021-05-27 ENCOUNTER — Other Ambulatory Visit: Payer: Self-pay

## 2021-05-27 VITALS — BP 123/77 | HR 77 | Temp 98.0°F | Resp 16 | Ht 66.0 in | Wt 185.0 lb

## 2021-05-27 DIAGNOSIS — I25118 Atherosclerotic heart disease of native coronary artery with other forms of angina pectoris: Secondary | ICD-10-CM

## 2021-05-27 DIAGNOSIS — I10 Essential (primary) hypertension: Secondary | ICD-10-CM

## 2021-05-27 NOTE — Progress Notes (Signed)
Follow up visit  Subjective:   Shawn Chang, male    DOB: [birth year], 60 y.o.   MRN: [id number]   HPI   Chief Complaint  Patient presents with   Coronary artery disease of native artery of native heart wi   Hypertension   Follow-up    2 month    60 year old Caucasian male with coronary artery disease status post CABG in 2016 (LIMA-LAD, Lt radial-OM1, SVG-OM2,OM3, SVG-PDA), prior unsuccessful PTCA to RCA, hypertension, CKD 3  Since his last visit, patient has had 2 episodes of left-sided pain while doing minimal activity, such as brushing teeth.  Episodes lasted for 8 seconds or less.  He also had random episodes of shortness of breath, again lasting for very short period.  He does not have any exertion related chest pain or shortness of breath, during his physical activity at work.  Blood pressure is well controlled.  Current Outpatient Medications on File Prior to Visit  Medication Sig Dispense Refill   albuterol (VENTOLIN HFA) 108 (90 Base) MCG/ACT inhaler Inhale 2 puffs into the lungs every 6 (six) hours as needed for wheezing or shortness of breath. 18 g 2   amLODipine (NORVASC) 5 MG tablet TAKE 1 TABLET BY MOUTH EVERY DAY 90 tablet 1   aspirin EC 81 MG tablet Take 81 mg by mouth daily.     atorvastatin (LIPITOR) 40 MG tablet TAKE 1 TABLET BY MOUTH EVERY DAY 90 tablet 1   cetirizine (ZYRTEC) 10 MG tablet Take 10 mg by mouth every morning.      furosemide (LASIX) 20 MG tablet Take 1 tablet (20 mg total) by mouth as directed. 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 90 tablet 3   losartan (COZAAR) 100 MG tablet TAKE 1 TABLET BY MOUTH EVERY DAY 90 tablet 3   metoprolol succinate (TOPROL-XL) 100 MG 24 hr tablet TAKE 1 TABLET BY MOUTH DAILY. TAKE WITH OR IMMEDIATELY FOLLOWING A MEAL 90 tablet 1   nitroGLYCERIN (NITROSTAT) 0.4 MG SL tablet Place 1 tablet (0.4 mg total) under the  tongue every 5 (five) minutes as needed for chest pain. 25 tablet 3   omeprazole (PRILOSEC) 10 MG capsule Take 10 mg by mouth daily.     No current facility-administered medications on file prior to visit.    Cardiovascular & other pertient studies:  Exercise Sestamibi stress test 02/28/2021: Exercise nuclear stress test was performed using Bruce protocol. Patient reached 7.0 METS, and 97% of age predicted maximum heart rate. Exercise capacity was low. No chest pain reported. Heart rate and hemodynamic response were normal. Stress EKG revealed no ischemic changes. SPECT images show small sized, mild intensity, reversible perfusion defect in basal inferior/inferoseptal myocardium. Stress LVEF 61%. Low risk study.   Echocardiogram 02/28/2021:  1. Left ventricle cavity is normal in size. Mild concentric hypertrophy of  the left ventricle. Abnormal septal wall  motion due to post-operative coronary artery bypass graft. Normal LV  systolic function with EF 67%. Doppler  evidence of grade I (impaired) diastolic dysfunction, normal LAP.  2. Structurally normal trileaflet aortic valve. Moderate (Grade II) aortic  regurgitation.  3. No evidence of pulmonary hypertension.   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  Op note 10/13/2015: Coronary artery bypass grafting x5 (LIMA-LAD, lt radial-OM1, seq  SVG-OM2,OM3, SVG-PDA)  Coronary intervention 10/08/2015: Mid RCA lesion, 99% stenosed. Post intervention, there is a 99% residual stenosis.   1. Mid RCA lesion, 99% stenosed. The lesion is type C Diffuse.   Angioplasty: Failed attempt. The pre-interventional distal flow is normal (TIMI 3). The post-interventional distal flow is decreased (TIMI 2). the intervention was unsuccessful At this lesion, a dissection occurred. IC nitroglycerin was given.    The lesion difficult to cross with Couger Xt, Then used a  [employer], but led to proximal ostial RCA. There was timi 1 flow. 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 Right femoral arterial access obtained for potential use of intra-aortic balloon pump. As we waited for surgical team to arrive, ST segments were back to baseline, patient completely asymptomatic, remained hemodynamically stable. Repeat right coronary angiography reveals TIMI 2 flow with proximal dissection. Hence felt urgent surgical revascularization in view of patient being on BRIL[place]TA was not advisable, however the surgical team will continue to follow him in case he needs emergent CABG. Dr. Tharon Aquas Trigt and Dr. Lilly Cove both present and reviewed the angiograms.     2. Ascending aorta shows mild aortic root dilatation without aneurysm formation, abdominal aorta shows diffuse distal abdominal aortic calcification and tortuosity, right external iliac artery appears to show at least 71 or greater percent stenosis.  Recent labs: 02/25/2021: Glucose 99, BUN/Cr 17/1.3. EGFR 63. Na/K 143/4.7. Rest of the CMP normal H/H 16/51. MCV 91. Platelets 220 Chol 87, TG 70, HDL 34, LDL 38   Review of Systems  Cardiovascular:  Positive for chest pain. Negative for dyspnea on exertion, leg swelling, palpitations and syncope.       Vitals:   05/27/21 1349  BP: 123/77  Pulse: 77  Resp: 16  Temp: 98 F (36.7 C)  SpO2: 96%     Body mass index is 29.86 kg/m. Filed Weights   05/27/21 1349  Weight: 185 lb (83.9 kg)     Objective:   Physical Exam Vitals and nursing note reviewed.  Constitutional:      General: He is not in acute distress. Neck:     Vascular: No JVD.   Cardiovascular:     Rate and Rhythm: Normal rate and regular rhythm.     Pulses: Normal pulses.     Heart sounds: Normal heart sounds. No murmur heard. Pulmonary:     Effort: Pulmonary effort is normal.     Breath sounds: Normal breath sounds. No wheezing or rales.  Musculoskeletal:     Right lower leg: No edema.     Left lower leg: No edema.          Assessment & Recommendations:   60 year old Caucasian male with coronary artery disease status post CABG in 2016 (LIMA-LAD, Lt radial-OM1, SVG-OM2,OM3, SVG-PDA), prior unsuccessful PTCA to RCA, hypertension, CKD 3, tobacco abuse  CAD: Recent episodes of chest pain or shortness of breath unlikely to be angina or angina equivalent.   Mild ischemia on stress test (02/2021). Reasonable to continue medical management at this time.   Continue aspirin, statin, Imdur, metoprolol.   Lipids well controlled.  Hypertension: Well controlled.  F/u in 6 months   Pelagia Iacobucci Esther Hardy, MD [facility] Pager: [phone] Office:  [phone]    

## 2021-08-02 ENCOUNTER — Other Ambulatory Visit: Payer: Self-pay | Admitting: Cardiology

## 2021-08-02 DIAGNOSIS — I10 Essential (primary) hypertension: Secondary | ICD-10-CM

## 2021-08-15 ENCOUNTER — Other Ambulatory Visit: Payer: Self-pay | Admitting: Cardiology

## 2021-08-15 DIAGNOSIS — I10 Essential (primary) hypertension: Secondary | ICD-10-CM

## 2021-08-30 ENCOUNTER — Telehealth: Payer: Self-pay

## 2021-08-30 NOTE — Telephone Encounter (Signed)
Called pt to inform him about the message above. Pt just wanted to make sure it was ok for him to stay were he is at. Pt mention his SOB is not that bad.

## 2021-08-30 NOTE — Telephone Encounter (Signed)
Possible causes include any acute heart pr lung condition, but also hypoxia due to high altitude. If he is severely short of breath, he should seek urgent care there. If not, I will be happy to see him once he is back. If so, please make an appt.  Thanks MJP

## 2021-09-11 ENCOUNTER — Other Ambulatory Visit: Payer: Self-pay | Admitting: Cardiology

## 2021-09-11 DIAGNOSIS — R0601 Orthopnea: Secondary | ICD-10-CM

## 2021-09-11 DIAGNOSIS — I25118 Atherosclerotic heart disease of native coronary artery with other forms of angina pectoris: Secondary | ICD-10-CM

## 2021-11-12 ENCOUNTER — Other Ambulatory Visit: Payer: Self-pay | Admitting: Cardiology

## 2021-12-02 ENCOUNTER — Ambulatory Visit: Payer: 59 | Admitting: Cardiology

## 2021-12-02 ENCOUNTER — Encounter: Payer: Self-pay | Admitting: Student

## 2021-12-02 ENCOUNTER — Ambulatory Visit: Payer: 59 | Admitting: Student

## 2021-12-02 ENCOUNTER — Other Ambulatory Visit: Payer: Self-pay

## 2021-12-02 VITALS — BP 138/78 | HR 86 | Temp 98.2°F | Ht 66.0 in | Wt 185.6 lb

## 2021-12-02 DIAGNOSIS — I10 Essential (primary) hypertension: Secondary | ICD-10-CM

## 2021-12-02 DIAGNOSIS — I25118 Atherosclerotic heart disease of native coronary artery with other forms of angina pectoris: Secondary | ICD-10-CM

## 2021-12-02 NOTE — Progress Notes (Signed)
Follow up visit  Subjective:   Shawn Chang, male    DOB: Jun 07, 1961, 61 y.o.   MRN: [id number]   HPI   Chief Complaint  Patient presents with   Coronary Artery Disease   Follow-up    61 y.o. Caucasian male with coronary artery disease status post CABG in 2016 (LIMA-LAD, Lt radial-OM1, SVG-OM2,OM3, SVG-PDA), prior unsuccessful PTCA to RCA, hypertension, CKD 3  Patient presents for 45-monthfollow-up.  Last office visit patient was stable from a cardiovascular standpoint recommended continue medical management, no changes were made.  Patient has continued to have rare brief episodes of nonexertional chest pain.  He has had no recurrence of dyspnea.  Overall patient feels well and remains fairly active, particularly at work.  Current Outpatient Medications on File Prior to Visit  Medication Sig Dispense Refill   albuterol (VENTOLIN HFA) 108 (90 Base) MCG/ACT inhaler Inhale 2 puffs into the lungs every 6 (six) hours as needed for wheezing or shortness of breath. 18 g 2   amLODipine (NORVASC) 5 MG tablet TAKE 1 TABLET BY MOUTH EVERY DAY 90 tablet 1   aspirin EC 81 MG tablet Take 81 mg by mouth daily.     atorvastatin (LIPITOR) 40 MG tablet TAKE 1 TABLET BY MOUTH EVERY DAY 90 tablet 1   cetirizine (ZYRTEC) 10 MG tablet Take 10 mg by mouth every morning.      furosemide (LASIX) 20 MG tablet TAKE 1 TABLET (20 MG TOTAL) BY MOUTH AS DIRECTED. TAKE IT ON DAYS YOU ARE NOT FLYING 90 tablet 2   isosorbide mononitrate (IMDUR) 30 MG 24 hr tablet Take 1 tablet (30 mg total) by mouth daily. 90 tablet 3   losartan (COZAAR) 100 MG tablet TAKE 1 TABLET BY MOUTH EVERY DAY 90 tablet 3   metoprolol succinate (TOPROL-XL) 100 MG 24 hr tablet TAKE 1 TABLET BY MOUTH DAILY. TAKE WITH OR IMMEDIATELY FOLLOWING A MEAL 90 tablet 1   nitroGLYCERIN (NITROSTAT) 0.4 MG SL tablet Place 1 tablet (0.4 mg total) under the tongue every 5 (five) minutes as needed for chest pain. 25 tablet 3   omeprazole (PRILOSEC) 10 MG  capsule Take 10 mg by mouth daily.     No current facility-administered medications on file prior to visit.    Cardiovascular & other pertient studies: EKG 2/30/2023:  Sinus rhythm at a rate of 79 bpm.  Right bundle branch block.  Inferior infarct old.  Unchanged compared to EKG 02/10/2021.  Exercise Sestamibi stress test 02/28/2021: Exercise nuclear stress test was performed using Bruce protocol. Patient reached 7.0 METS, and 97% of age predicted maximum heart rate. Exercise capacity was low. No chest pain reported. Heart rate and hemodynamic response were normal. Stress EKG revealed no ischemic changes. SPECT images show small sized, mild intensity, reversible perfusion defect in basal inferior/inferoseptal myocardium. Stress LVEF 61%. Low risk study.   Echocardiogram 02/28/2021:  1. Left ventricle cavity is normal in size. Mild concentric hypertrophy of  the left ventricle. Abnormal septal wall  motion due to post-operative coronary artery bypass graft. Normal LV  systolic function with EF 67%. Doppler  evidence of grade I (impaired) diastolic dysfunction, normal LAP.  2. Structurally normal trileaflet aortic valve. Moderate (Grade II) aortic  regurgitation.  3. No evidence of pulmonary hypertension.   Op note 10/13/2015: Coronary artery bypass grafting x5 (LIMA-LAD, lt radial-OM1, seq SVG-OM2,OM3, SVG-PDA)  Coronary intervention 10/08/2015: Mid RCA lesion, 99% stenosed. Post intervention, there is a 99% residual stenosis.   1. Mid  RCA lesion, 99% stenosed. The lesion is type C Diffuse.   Angioplasty: Failed attempt. The pre-interventional distal flow is normal (TIMI 3). The post-interventional distal flow is decreased (TIMI 2). the intervention was unsuccessful At this lesion, a dissection occurred. IC nitroglycerin was given.    The lesion difficult to cross with Couger Xt, Then used a [employer], but led to proximal ostial RCA. There was timi 1 flow. 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 Right femoral arterial access obtained for potential use of intra-aortic balloon pump. As we waited for surgical team to arrive, ST segments were back to baseline, patient completely asymptomatic, remained hemodynamically stable. Repeat right coronary angiography reveals TIMI 2 flow with proximal dissection. Hence felt urgent surgical revascularization in view of patient being on BRIL[place]TA was not advisable, however the surgical team will continue to follow him in case he needs emergent CABG. Dr. Tharon Aquas Trigt and Dr. Lilly Cove both present and reviewed the angiograms.     2. Ascending aorta shows mild aortic root dilatation without aneurysm formation, abdominal aorta shows diffuse distal abdominal aortic calcification and tortuosity, right external iliac artery appears to show at least 62 or greater percent stenosis.  Recent labs: 02/25/2021: Glucose 99, BUN/Cr 17/1.3. EGFR 63. Na/K 143/4.7. Rest of the CMP normal H/H 16/51. MCV 91. Platelets 220 Chol 87, TG 70, HDL 34, LDL 38   Review of Systems  Cardiovascular:  Positive for chest pain (rare, brief). Negative for dyspnea on exertion, leg swelling, palpitations and syncope.       Vitals:   12/02/21 1316  BP: 138/78  Pulse: 86  Temp: 98.2 F (36.8 C)  SpO2: 96%     Body mass index is 29.96 kg/m. Filed Weights   12/02/21 1316  Weight: 185 lb 9.6 oz (84.2 kg)     Objective:   Physical Exam Vitals reviewed.  Constitutional:      General: He is not in acute distress. Neck:     Vascular: No JVD.  Cardiovascular:     Rate and Rhythm: Normal rate and regular rhythm.     Pulses: Normal pulses.      Heart sounds: Normal heart sounds. No murmur heard. Pulmonary:     Effort: Pulmonary effort is normal.     Breath sounds: Normal breath sounds. No wheezing or rales.  Musculoskeletal:     Right lower leg: No edema.     Left lower leg: No edema.  Physical exam unchanged compared to previous office visit.      Assessment & Recommendations:   61 y.o. Caucasian male with coronary artery disease status post CABG in 2016 (LIMA-LAD, Lt radial-OM1, SVG-OM2,OM3, SVG-PDA), prior unsuccessful PTCA to RCA, hypertension, CKD 3, tobacco abuse  CAD: Patient has had no recurrence of dyspnea and has very rare brief episodes of chest pain, but are unlikely to be anginal or anginal equivalent. Surveillance stress test in 02/2021 showed only mild ischemia, therefore have continued medical management at this time. As patient's chest pain and dyspnea have both improved since last office visit, it is reasonable to continue medical management currently. Continue aspirin, statin, Imdur, metoprolol Lipids and blood pressure well controlled We will repeat lipid profile testing as well as CBC  and BMP prior to next office visit.  Hypertension: Well-controlled Continue amlodipine, losartan, metoprolol, and Imdur  Follow up in 6 months, sooner if needed.    Alethia Berthold, PA-C 12/02/2021, 2:09 PM Office: [phone]

## 2022-01-23 ENCOUNTER — Other Ambulatory Visit: Payer: Self-pay | Admitting: Cardiology

## 2022-01-23 DIAGNOSIS — I10 Essential (primary) hypertension: Secondary | ICD-10-CM

## 2022-01-30 ENCOUNTER — Ambulatory Visit
Admission: EM | Admit: 2022-01-30 | Discharge: 2022-01-30 | Disposition: A | Discharge status: Home / Self Care | Payer: 59 | Attending: Emergency Medicine | Admitting: Emergency Medicine

## 2022-01-30 DIAGNOSIS — J3089 Other allergic rhinitis: Secondary | ICD-10-CM

## 2022-01-30 DIAGNOSIS — J302 Other seasonal allergic rhinitis: Secondary | ICD-10-CM

## 2022-01-30 DIAGNOSIS — J4531 Mild persistent asthma with (acute) exacerbation: Secondary | ICD-10-CM

## 2022-01-30 DIAGNOSIS — R051 Acute cough: Secondary | ICD-10-CM

## 2022-01-30 DIAGNOSIS — J309 Allergic rhinitis, unspecified: Secondary | ICD-10-CM

## 2022-01-30 MED ORDER — METHYLPREDNISOLONE SODIUM SUCC 125 MG IJ SOLR: 125.0000 mg | Freq: Once | INTRAMUSCULAR | Status: DC

## 2022-01-30 MED ORDER — ALBUTEROL SULFATE HFA 108 (90 BASE) MCG/ACT IN AERS: 2.0000 | INHALATION_SPRAY | Freq: Four times a day (QID) | RESPIRATORY_TRACT | 2 refills | Status: DC | PRN

## 2022-01-30 MED ORDER — CETIRIZINE HCL 10 MG PO TABS: 10.0000 mg | ORAL_TABLET | Freq: Every day | ORAL | 1 refills | Status: DC

## 2022-01-30 MED ORDER — MONTELUKAST SODIUM 10 MG PO TABS: 10.0000 mg | ORAL_TABLET | Freq: Every day | ORAL | 2 refills | Status: DC

## 2022-01-30 MED ORDER — ALBUTEROL SULFATE (2.5 MG/3ML) 0.083% IN NEBU
2.5000 mg | INHALATION_SOLUTION | Freq: Once | RESPIRATORY_TRACT | Status: AC
  Administered 2022-01-30: 2.5 mg via RESPIRATORY_TRACT

## 2022-01-30 MED ORDER — DEXAMETHASONE SODIUM PHOSPHATE 10 MG/ML IJ SOLN
10.0000 mg | Freq: Once | INTRAMUSCULAR | Status: AC
  Administered 2022-01-30: 10 mg via INTRAMUSCULAR

## 2022-01-30 MED ORDER — ADVAIR HFA 230-21 MCG/ACT IN AERO: 2.0000 | INHALATION_SPRAY | Freq: Two times a day (BID) | RESPIRATORY_TRACT | 0 refills | Status: DC

## 2022-01-30 MED ORDER — GUAIFENESIN 400 MG PO TABS: ORAL_TABLET | ORAL | 0 refills | Status: DC

## 2022-01-30 MED ORDER — FLUTICASONE PROPIONATE 50 MCG/ACT NA SUSP: 1.0000 | Freq: Every day | NASAL | 1 refills | Status: DC

## 2022-01-30 NOTE — Discharge Instructions (Addendum)
You were tested for both COVID and influenza today because here in the urgent care setting, we do not have an available option for an individual influenza test.  I apologize for the redundancy if you have already taken a COVID test at home.  The result of your viral testing will be posted to your MyChart once it is complete, this typically takes 24 to 48 hours.  If there is a positive result, you will be contacted by phone with further recommendations, if any.  ?  ?Your symptoms and my physical exam findings are concerning for exacerbation of your underlying asthma.  It is important that you are consistent with taking your inhaled medications exactly as prescribed.   ? ?Your symptoms and my physical exam findings are also concerning for exacerbation of your underlying allergies.  It is important that you are consistent with taking allergy medications exactly as prescribed.   ?  ?Please see the list below for recommended medications, dosages and frequencies to provide relief of your current symptoms:   ?  ?Dexamethasone IM (Decadron):  To quickly address your significant respiratory inflammation, you were provided with an injection of methylprednisolone in the office today.  You should continue to feel the full benefit of the steroid for the next 4 to 6 hours.  ?  ?ProAir, Ventolin, Proventil (albuterol): This inhaled medication contains a short acting beta agonist bronchodilator.  This medication works on the smooth muscle that opens and constricts of your airways by relaxing the muscle.  The result of relaxation of the smooth muscle is increased air movement and improved work of breathing.  This is a short acting medication that can be used every 4-6 hours as needed for increased work of breathing, shortness of breath, wheezing and excessive coughing.  I have provided you with a prescription.  ? ?Advair (fluticasone and salmeterol): Please inhale 2 puffs twice daily.  This inhaled medication contains a  corticosteroid and long-acting form of albuterol.  The inhaled steroid and this medication  is not absorbed into the body and will not cause side effects such as increased blood sugar levels, irritability, sleeplessness or weight gain.  Inhaled corticosteroid are sort of like topical steroid creams but, as you can imagine, it is not practical to attempt to rub a steroid cream inside of your lungs.  The long-acting albuterol works similarly to the short acting albuterol found in your rescue inhaler but provides 24-hour relaxation of the smooth muscles that open and constrict your airways; your short acting rescue inhaler can only provide for a few hours this benefit for a few hours.  Please feel free to continue using your short acting rescue inhaler as often as needed throughout the day for shortness of breath, wheezing, and cough. ?  ?Zyrtec (cetirizine): This is an excellent second-generation antihistamine that helps to reduce respiratory inflammatory response to environmental allergens.  In some patients, this medication can cause daytime sleepiness so I recommend that you take 1 tablet daily at bedtime.  I have provided you with a prescription for this medication ?  ?Singulair (montelukast): This is a mast cell stabilizer that works well with antihistamines.  Mast cells are responsible for stimulating histamine production so you can imagine that if we can reduce the activity of your mast cells, then fewer histamines will be produced and inflammation caused by allergy exposure will be significantly reduced.  Recommend he take this medication at the same time you take your antihistamine. ?  ?Flonase (fluticasone): This is a  steroid nasal spray that you use once daily, 1 spray in each nare.  This medication does not work well if you decide to use it only used as you feel you need to, it works best used on a daily basis.  After 3 to 5 days of use, you will notice significant reduction of the inflammation and mucus  production that is currently being caused by exposure to allergens, whether seasonal or environmental.  The most common side effect of this medication is nosebleeds.  If you experience a nosebleed, please discontinue use for 1 week, then feel free to resume.  I have provided you with a prescription for this medication. ?  ?Not taking your allergy medications on a regular basis can increase your risk of more frequent upper respiratory infections that may or may not require the use of antibiotics, serious exacerbations that require the use of oral steroids, loss of time at work and missed social opportunities. ?  ?If you find that you have not had significant relief of your symptoms in the next 3 to 5 days, please follow-up with your primary care provider or return to urgent care for repeat evaluation. ?  ?If you find that you have not had significant relief of your symptoms in the next 7 to 10 days, please follow-up with your primary care provider ?  ?Thank you for visiting urgent care today.  We appreciate the opportunity to participate in your care. ?  ? ?

## 2022-01-30 NOTE — ED Triage Notes (Signed)
Pt c/o congestion, cough, and wheezing since Saturday. ?

## 2022-01-30 NOTE — ED Provider Notes (Signed)
?[facility] ? ? ? ?CSN: [id number] ?Arrival date & time: 01/30/22  0175 ?  ? ?HISTORY  ? ?Chief Complaint  ?Patient presents with  ? Cough  ? Nasal Congestion  ? Wheezing  ? ?HPI ?Shawn Chang is a 61 y.o. male. Pt c/o congestion, cough, and wheezing since Saturday.  Patient has a history of coronary artery disease with normal ejection fraction, most recent EKG revealed old infarct and right bundle branch block that was done in February 2023.  Patient denies chest pain, shortness of breath.  Patient states cough is nonproductive, worse at night but is beginning to feel like he is wheezing and also hears a gurgling sound sometimes when he is coughing despite not being able to cough up any phlegm.  Patient reports a history of allergies, states he is unsure whether or not he has been diagnosed with asthma but states he does have an albuterol inhaler that he uses a few times a week used to be on another inhaler a few years ago that worked really well but does not recall the name of it.  EMR reviewed, patient was prescribed Qvar in addition to albuterol 2018.  Patient states he self discontinued Qvar because he was uncomfortable taking a steroid twice a day. ? ?The history is provided by the patient.  ?Past Medical History:  ?Diagnosis Date  ? Acid reflux   ? Asthma   ? Coronary artery disease   ? Hyperlipidemia   ? Hypertension   ? Myocardial infarction [facility])   ? Retinal detachment   ? ?Patient Active Problem List  ? Diagnosis Date Noted  ? Exercise-induced asthma 02/10/2021  ? Orthopnea 02/10/2021  ? Wheezing 03/15/2020  ? Coronary artery disease of native artery of native heart with stable angina pectoris (HCC) 10/08/2015  ? Abnormal nuclear stress test 09/24/2015  ? Coronary atherosclerosis due to calcified coronary lesion 09/24/2015  ? Angina pectoris (HCC) 09/09/2015  ? Essential hypertension 02/15/2015  ? ?Past Surgical History:  ?Procedure Laterality Date  ? CARDIAC CATHETERIZATION N/A 10/01/2015  ?  Procedure: Left Heart Cath and Coronary Angiography;  Surgeon: Yates Decamp, MD;  Location: [facility] INVASIVE CV LAB;  Service: Cardiovascular;  Laterality: N/A;  ? CARDIAC CATHETERIZATION N/A 10/08/2015  ? Procedure: Coronary Stent Intervention;  Surgeon: Yates Decamp, MD;  Location: [facility] INVASIVE CV LAB;  Service: Cardiovascular;  Laterality: N/A;  ? CORONARY ARTERY BYPASS GRAFT N/A 10/12/2015  ? Procedure: CORONARY ARTERY BYPASS GRAFTING (CABG) x five , using left internal mammary artery and bilateral legs greater saphenous vein harvested endoscopically;  Surgeon: Kerin Perna, MD;  Location: [facility] OR;  Service: Open Heart Surgery;  Laterality: N/A;  ? CORONARY ARTERY BYPASS GRAFT    ? KNEE SURGERY    ? PERIPHERAL VASCULAR CATHETERIZATION  10/08/2015  ? Procedure: Abdominal Aortogram;  Surgeon: Yates Decamp, MD;  Location: [facility] INVASIVE CV LAB;  Service: Cardiovascular;;  ? PERIPHERAL VASCULAR CATHETERIZATION  10/08/2015  ? Procedure: Aortic Arch Angiography;  Surgeon: Yates Decamp, MD;  Location: [facility] INVASIVE CV LAB;  Service: Cardiovascular;;  ? shoulder surgery    ? TEE WITHOUT CARDIOVERSION N/A 10/12/2015  ? Procedure: TRANSESOPHAGEAL ECHOCARDIOGRAM (TEE);  Surgeon: Kerin Perna, MD;  Location: [facility] OR;  Service: Open Heart Surgery;  Laterality: N/A;  ? TONSILLECTOMY    ? Pt has also had sinus and throat surgery  ? ? ?Home Medications   ? ?Prior to Admission medications   ?Medication Sig Start Date End Date Taking? Authorizing Provider  ?  albuterol (VENTOLIN HFA) 108 (90 Base) MCG/ACT inhaler Inhale 2 puffs into the lungs every 6 (six) hours as needed for wheezing or shortness of breath. 03/15/20   Patwardhan, Manish J, MD  ?amLODipine (NORVASC) 5 MG tablet TAKE 1 TABLET BY MOUTH EVERY DAY 08/15/21   Patwardhan, Manish J, MD  ?aspirin EC 81 MG tablet Take 81 mg by mouth daily.    [provider]  ?atorvastatin (LIPITOR) 40 MG tablet TAKE 1 TABLET BY MOUTH EVERY DAY 11/14/21   Cantwell, Celeste C, PA-C  ?cetirizine (ZYRTEC) 10  MG tablet Take 10 mg by mouth every morning.     [provider]  ?furosemide (LASIX) 20 MG tablet TAKE 1 TABLET (20 MG TOTAL) BY MOUTH AS DIRECTED. TAKE IT ON DAYS YOU ARE NOT FLYING 09/12/21 12/11/21  Patwardhan, Anabel BeneManish J, MD  ?isosorbide mononitrate (IMDUR) 30 MG 24 hr tablet Take 1 tablet (30 mg total) by mouth daily. 03/18/21 06/16/21  Patwardhan, Anabel BeneManish J, MD  ?losartan (COZAAR) 100 MG tablet TAKE 1 TABLET BY MOUTH EVERY DAY 05/20/21   Patwardhan, Manish J, MD  ?metoprolol succinate (TOPROL-XL) 100 MG 24 hr tablet TAKE 1 TABLET BY MOUTH EVERY DAY WITH OR IMMEDIATELY FOLLOWING A MEAL 01/23/22   Cantwell, Celeste C, PA-C  ?nitroGLYCERIN (NITROSTAT) 0.4 MG SL tablet Place 1 tablet (0.4 mg total) under the tongue every 5 (five) minutes as needed for chest pain. 02/10/21   Patwardhan, Anabel BeneManish J, MD  ?omeprazole (PRILOSEC) 10 MG capsule Take 10 mg by mouth daily.    [provider]  ? ?Family History ?Family History  ?Problem Relation Age of Onset  ? Heart disease Mother   ?     May 2017  ? Heart disease Father   ? Hypertension Sister   ? Stroke Sister   ? Heart attack Brother   ?     massive, survived  ? Heart attack Brother   ? ?Social History ?Social History  ? ?Tobacco Use  ? Smoking status: Former  ?  Packs/day: 1.50  ?  Years: 39.00  ?  Pack years: 58.50  ?  Types: Cigarettes  ?  Quit date: 08/31/2015  ?  Years since quitting: 6.4  ? Smokeless tobacco: Never  ?Vaping Use  ? Vaping Use: Never used  ?Substance Use Topics  ? Alcohol use: Yes  ?  Alcohol/week: 0.0 standard drinks  ?  Comment: 1-3 beers per week  ? Drug use: No  ? ?Allergies   ?Other, Morphine, and Morphine and related ? ?Review of Systems ?Review of Systems ?Pertinent findings noted in history of present illness.  ? ?Physical Exam ?Triage Vital Signs ?ED Triage Vitals  ?Enc Vitals Group  ?   BP 08/26/21 0827 (!) 147/82  ?   Pulse Rate 08/26/21 0827 72  ?   Resp 08/26/21 0827 18  ?   Temp 08/26/21 0827 98.3 ?F (36.8 ?C)  ?   Temp Source  08/26/21 0827 Oral  ?   SpO2 08/26/21 0827 98 %  ?   Weight --   ?   Height --   ?   Head Circumference --   ?   Peak Flow --   ?   Pain Score 08/26/21 0826 5  ?   Pain Loc --   ?   Pain Edu? --   ?   Excl. in GC? --   ?No data found. ? ?Updated Vital Signs ?BP 125/76 (BP Location: Right Arm)   Pulse 88  Temp 97.8 ?F (36.6 ?C) (Oral)   Resp 18   SpO2 94%  ? ?Physical Exam ?Vitals and nursing note reviewed.  ?Constitutional:   ?   General: He is not in acute distress. ?   Appearance: Normal appearance. He is not ill-appearing.  ?HENT:  ?   Head: Normocephalic and atraumatic.  ?   Salivary Glands: Right salivary gland is not diffusely enlarged or tender. Left salivary gland is not diffusely enlarged or tender.  ?   Right Ear: Ear canal and external ear normal. No drainage. A middle ear effusion is present. There is no impacted cerumen. Tympanic membrane is bulging. Tympanic membrane is not injected or erythematous.  ?   Left Ear: Ear canal and external ear normal. No drainage. A middle ear effusion is present. There is no impacted cerumen. Tympanic membrane is bulging. Tympanic membrane is not injected or erythematous.  ?   Ears:  ?   Comments: Bilateral EACs normal, both TMs bulging with clear fluid ?   Nose: Rhinorrhea present. No nasal deformity, septal deviation, signs of injury, nasal tenderness, mucosal edema or congestion. Rhinorrhea is clear.  ?   Right Nostril: Occlusion present. No foreign body, epistaxis or septal hematoma.  ?   Left Nostril: Occlusion present. No foreign body, epistaxis or septal hematoma.  ?   Right Turbinates: Enlarged, swollen and pale.  ?   Left Turbinates: Enlarged, swollen and pale.  ?   Right Sinus: No maxillary sinus tenderness or frontal sinus tenderness.  ?   Left Sinus: No maxillary sinus tenderness or frontal sinus tenderness.  ?   Mouth/Throat:  ?   Lips: Pink. No lesions.  ?   Mouth: Mucous membranes are moist. No oral lesions.  ?   Pharynx: Oropharynx is clear. Uvula  midline. No posterior oropharyngeal erythema or uvula swelling.  ?   Tonsils: No tonsillar exudate. 0 on the right. 0 on the left.  ?   Comments: Postnasal drip ?Eyes:  ?   General: Lids are normal.     ?   R

## 2022-02-02 LAB — COVID-19, FLU A+B NAA
Influenza A, NAA: NOT DETECTED
Influenza B, NAA: NOT DETECTED
SARS-CoV-2, NAA: NOT DETECTED

## 2022-02-13 ENCOUNTER — Other Ambulatory Visit: Payer: Self-pay | Admitting: Cardiology

## 2022-02-13 DIAGNOSIS — I10 Essential (primary) hypertension: Secondary | ICD-10-CM

## 2022-03-12 ENCOUNTER — Other Ambulatory Visit: Payer: Self-pay | Admitting: Cardiology

## 2022-03-12 DIAGNOSIS — I25118 Atherosclerotic heart disease of native coronary artery with other forms of angina pectoris: Secondary | ICD-10-CM

## 2022-05-17 ENCOUNTER — Other Ambulatory Visit: Payer: Self-pay | Admitting: Cardiology

## 2022-05-17 ENCOUNTER — Other Ambulatory Visit: Payer: Self-pay | Admitting: Student

## 2022-05-17 DIAGNOSIS — I10 Essential (primary) hypertension: Secondary | ICD-10-CM

## 2022-05-19 ENCOUNTER — Other Ambulatory Visit: Payer: Self-pay | Admitting: Cardiology

## 2022-05-19 DIAGNOSIS — I10 Essential (primary) hypertension: Secondary | ICD-10-CM

## 2022-06-02 ENCOUNTER — Ambulatory Visit: Payer: 59 | Admitting: Cardiology

## 2022-06-02 ENCOUNTER — Encounter: Payer: Self-pay | Admitting: Cardiology

## 2022-06-02 VITALS — BP 143/85 | HR 68 | Temp 98.0°F | Resp 16 | Ht 66.0 in | Wt 180.0 lb

## 2022-06-02 DIAGNOSIS — I25118 Atherosclerotic heart disease of native coronary artery with other forms of angina pectoris: Secondary | ICD-10-CM

## 2022-06-02 DIAGNOSIS — I10 Essential (primary) hypertension: Secondary | ICD-10-CM

## 2022-06-02 NOTE — Progress Notes (Signed)
Follow up visit  Subjective:   Shawn Chang, male    DOB: [birth year], 61 y.o.   MRN: [id number]   HPI   Chief Complaint  Patient presents with   Coronary Artery Disease   Follow-up    28 month    61 year old Caucasian male with coronary artery disease status post CABG in 2016 (LIMA-LAD, Lt radial-OM1, SVG-OM2,OM3, SVG-PDA), prior unsuccessful PTCA to RCA, hypertension, CKD 3  Patient is doing well. He denies chest pain, shortness of breath, palpitations, leg edema, orthopnea, PND, TIA/syncope. Blood pressure is slightly elevated today, generally lower at home. He had labs checked today, results not available to me.    Current Outpatient Medications:    albuterol (VENTOLIN HFA) 108 (90 Base) MCG/ACT inhaler, Inhale 2 puffs into the lungs every 6 (six) hours as needed for wheezing or shortness of breath (Cough)., Disp: 36 g, Rfl: 2   amLODipine (NORVASC) 5 MG tablet, TAKE 1 TABLET BY MOUTH EVERY DAY, Disp: 90 tablet, Rfl: 1   aspirin EC 81 MG tablet, Take 81 mg by mouth daily., Disp: , Rfl:    atorvastatin (LIPITOR) 40 MG tablet, TAKE 1 TABLET BY MOUTH EVERY DAY, Disp: 90 tablet, Rfl: 1   cetirizine (ZYRTEC ALLERGY) 10 MG tablet, Take 1 tablet (10 mg total) by mouth at bedtime., Disp: 90 tablet, Rfl: 1   fluticasone (FLONASE) 50 MCG/ACT nasal spray, Place 1 spray into both nostrils daily. Begin by using 2 sprays in each nare daily for 3 to 5 days, then decrease to 1 spray in each nare daily., Disp: 48 mL, Rfl: 1   fluticasone-salmeterol (ADVAIR HFA) 230-21 MCG/ACT inhaler, Inhale 2 puffs into the lungs 2 (two) times daily., Disp: 360 each, Rfl: 0   guaifenesin (HUMIBID E) 400 MG TABS tablet, Take 1 tablet 3 times daily as needed for chest congestion and cough, Disp: 21 tablet, Rfl: 0   isosorbide mononitrate (IMDUR) 30 MG 24 hr tablet, TAKE 1 TABLET BY MOUTH EVERY DAY, Disp: 90 tablet, Rfl: 3   losartan (COZAAR) 100 MG tablet, TAKE 1 TABLET BY MOUTH EVERY DAY, Disp: 90 tablet, Rfl:  3   metoprolol succinate (TOPROL-XL) 100 MG 24 hr tablet, TAKE 1 TABLET BY MOUTH EVERY DAY WITH OR IMMEDIATELY FOLLOWING A MEAL, Disp: 90 tablet, Rfl: 1   nitroGLYCERIN (NITROSTAT) 0.4 MG SL tablet, Place 1 tablet (0.4 mg total) under the tongue every 5 (five) minutes as needed for chest pain., Disp: 25 tablet, Rfl: 3   omeprazole (PRILOSEC) 10 MG capsule, Take 10 mg by mouth daily., Disp: , Rfl:   Cardiovascular & other pertient studies:  EKG 06/02/2022: Sinus rhythm 66 bpm  Right bundle branch block Old inferior infarct  Exercise Sestamibi stress test 02/28/2021: Exercise nuclear stress test was performed using Bruce protocol. Patient reached 7.0 METS, and 97% of age predicted maximum heart rate. Exercise capacity was low. No chest pain reported. Heart rate and hemodynamic response were normal. Stress EKG revealed no ischemic changes. SPECT images show small sized, mild intensity, reversible perfusion defect in basal inferior/inferoseptal myocardium. Stress LVEF 61%. Low risk study.   Echocardiogram 02/28/2021:  1. Left ventricle cavity is normal in size. Mild concentric hypertrophy of  the left ventricle. Abnormal septal wall  motion due to post-operative coronary artery bypass graft. Normal LV  systolic function with EF 67%. Doppler  evidence of grade I (impaired) diastolic dysfunction, normal LAP.  2. Structurally normal trileaflet aortic valve. Moderate (Grade II) aortic  regurgitation.  3. No  evidence of pulmonary hypertension.   Op note 10/13/2015: Coronary artery bypass grafting x5 (LIMA-LAD, lt radial-OM1, seq SVG-OM2,OM3, SVG-PDA)  Coronary intervention 10/08/2015: Mid RCA lesion, 99% stenosed. Post intervention, there is a 99% residual stenosis.   1. Mid RCA lesion, 99% stenosed. The lesion is type C Diffuse.   Angioplasty: Failed attempt. The pre-interventional distal flow is normal (TIMI 3). The post-interventional distal flow is decreased (TIMI 2). the intervention was  unsuccessful At this lesion, a dissection occurred. IC nitroglycerin was given.    The lesion difficult to cross with Couger Xt, Then used a [employer], but led to proximal ostial RCA. There was timi 1 flow. 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 Right femoral arterial access obtained for potential use of intra-aortic balloon pump. As we waited for surgical team to arrive, ST segments were back to baseline, patient completely asymptomatic, remained hemodynamically stable. Repeat right coronary angiography reveals TIMI 2 flow with proximal dissection. Hence felt urgent surgical revascularization in view of patient being on BRIL[place]TA was not advisable, however the surgical team will continue to follow him in case he needs emergent CABG. Dr. Tharon Aquas Trigt and Dr. Lilly Cove both present and reviewed the angiograms.     2. Ascending aorta shows mild aortic root dilatation without aneurysm formation, abdominal aorta shows diffuse distal abdominal aortic calcification and tortuosity, right external iliac artery appears to show at least 22 or greater percent stenosis.  Recent labs: 02/25/2021: Glucose 99, BUN/Cr 17/1.3. EGFR 63. Na/K 143/4.7. Rest of the CMP normal H/H 16/51. MCV 91. Platelets 220 Chol 87, TG 70, HDL 34, LDL 38   Review of Systems  Cardiovascular:  Negative for chest pain, dyspnea on exertion, leg swelling, palpitations and syncope.        Vitals:   06/02/22 1349 06/02/22 1358  BP: (!) 146/92 (!) 143/85  Pulse: (!) 59 68  Resp: 16   Temp: 98 F (36.7 C) 98 F (36.7 C)  SpO2: 94%      Body mass index is 29.05 kg/m. Filed Weights   06/02/22 1349   Weight: 180 lb (81.6 kg)     Objective:   Physical Exam Vitals and nursing note reviewed.  Constitutional:      General: He is not in acute distress. Neck:     Vascular: No JVD.  Cardiovascular:     Rate and Rhythm: Normal rate and regular rhythm.     Pulses: Normal pulses.     Heart sounds: Normal heart sounds. No murmur heard. Pulmonary:     Effort: Pulmonary effort is normal.     Breath sounds: Normal breath sounds. No wheezing or rales.  Musculoskeletal:     Right lower leg: No edema.     Left lower leg: No edema.           Assessment & Recommendations:   61 year old Caucasian male with coronary artery disease status post CABG in 2016 (LIMA-LAD, Lt radial-OM1, SVG-OM2,OM3, SVG-PDA), prior unsuccessful PTCA to RCA, hypertension, CKD 3, tobacco abuse  CAD: No angina symptoms. Continue aspirin, statin, Imdur, metoprolol.   Lipids well controlled. Labs from today awaited.   Hypertension: Well controlled.  F/u in 6 months   Keanan Melander Esther Hardy, MD [facility]  Cardiovascular. [place] Pager: [phone] Office: [phone]

## 2022-06-03 LAB — CBC
Hematocrit: 45.7 % (ref 37.5–51.0)
Hemoglobin: 15.4 g/dL (ref 13.0–17.7)
MCH: 29.8 pg (ref 26.6–33.0)
MCHC: 33.7 g/dL (ref 31.5–35.7)
MCV: 89 fL (ref 79–97)
Platelets: 203 10*3/uL (ref 150–450)
RBC: 5.16 x10E6/uL (ref 4.14–5.80)
RDW: 12.3 % (ref 11.6–15.4)
WBC: 7.3 10*3/uL (ref 3.4–10.8)

## 2022-06-03 LAB — BASIC METABOLIC PANEL
BUN/Creatinine Ratio: 15 (ref 10–24)
BUN: 17 mg/dL (ref 8–27)
CO2: 20 mmol/L (ref 20–29)
Calcium: 9.4 mg/dL (ref 8.6–10.2)
Chloride: 105 mmol/L (ref 96–106)
Creatinine, Ser: 1.14 mg/dL (ref 0.76–1.27)
Glucose: 98 mg/dL (ref 70–99)
Potassium: 4.1 mmol/L (ref 3.5–5.2)
Sodium: 141 mmol/L (ref 134–144)
eGFR: 73 mL/min/{1.73_m2} (ref >59)

## 2022-06-03 LAB — LIPID PANEL WITH LDL/HDL RATIO
Cholesterol, Total: 74 mg/dL — ABNORMAL LOW (ref 100–199)
HDL: 33 mg/dL — ABNORMAL LOW (ref >39)
LDL Chol Calc (NIH): 25 mg/dL (ref 0–99)
LDL/HDL Ratio: 0.8 ratio (ref 0.0–3.6)
Triglycerides: 73 mg/dL (ref 0–149)
VLDL Cholesterol Cal: 16 mg/dL (ref 5–40)

## 2022-06-10 ENCOUNTER — Other Ambulatory Visit: Payer: Self-pay | Admitting: Cardiology

## 2022-06-10 DIAGNOSIS — I25118 Atherosclerotic heart disease of native coronary artery with other forms of angina pectoris: Secondary | ICD-10-CM

## 2022-06-10 DIAGNOSIS — R0601 Orthopnea: Secondary | ICD-10-CM

## 2022-08-04 ENCOUNTER — Other Ambulatory Visit: Payer: Self-pay

## 2022-08-04 DIAGNOSIS — I10 Essential (primary) hypertension: Secondary | ICD-10-CM

## 2022-08-04 MED ORDER — METOPROLOL SUCCINATE ER 100 MG PO TB24: ORAL_TABLET | ORAL | 3 refills | Status: DC

## 2022-11-13 ENCOUNTER — Other Ambulatory Visit: Payer: Self-pay | Admitting: Cardiology

## 2022-12-08 ENCOUNTER — Encounter: Payer: Self-pay | Admitting: Cardiology

## 2022-12-08 ENCOUNTER — Ambulatory Visit: Payer: 59 | Admitting: Cardiology

## 2022-12-08 VITALS — BP 137/82 | HR 69 | Resp 16 | Ht 66.0 in | Wt 186.0 lb

## 2022-12-08 DIAGNOSIS — I25118 Atherosclerotic heart disease of native coronary artery with other forms of angina pectoris: Secondary | ICD-10-CM

## 2022-12-08 DIAGNOSIS — I10 Essential (primary) hypertension: Secondary | ICD-10-CM

## 2022-12-08 NOTE — Progress Notes (Signed)
Follow up visit  Subjective:   Shawn Chang, male    DOB: 1961/04/01, 62 y.o.   MRN: [id number]   HPI   Chief Complaint  Patient presents with   Coronary Artery Disease   Follow-up    74 month    62 year old Caucasian male with coronary artery disease status post CABG in 2016 (LIMA-LAD, Lt radial-OM1, SVG-OM2,OM3, SVG-PDA), prior unsuccessful PTCA to RCA, hypertension, CKD 3  In the last few weeks, patient has had 2 episodes of random shortness of breath with relatively low intensity activity, such as feeding the deer in the backyard on a cold day.  As opposed to this, he does a lot of walking while carrying heavy equipment such as 30 pound book bag, while at work, without having any symptoms.  He denies any chest pain.  He has random cramps in his legs at night.  Current Outpatient Medications:    albuterol (VENTOLIN HFA) 108 (90 Base) MCG/ACT inhaler, Inhale 2 puffs into the lungs every 6 (six) hours as needed for wheezing or shortness of breath (Cough)., Disp: 36 g, Rfl: 2   amLODipine (NORVASC) 5 MG tablet, TAKE 1 TABLET BY MOUTH EVERY DAY, Disp: 90 tablet, Rfl: 1   aspirin EC 81 MG tablet, Take 81 mg by mouth daily., Disp: , Rfl:    atorvastatin (LIPITOR) 40 MG tablet, TAKE 1 TABLET BY MOUTH EVERY DAY, Disp: 90 tablet, Rfl: 1   cetirizine (ZYRTEC ALLERGY) 10 MG tablet, Take 1 tablet (10 mg total) by mouth at bedtime., Disp: 90 tablet, Rfl: 1   fluticasone (FLONASE) 50 MCG/ACT nasal spray, Place 1 spray into both nostrils daily. Begin by using 2 sprays in each nare daily for 3 to 5 days, then decrease to 1 spray in each nare daily., Disp: 48 mL, Rfl: 1   fluticasone-salmeterol (ADVAIR HFA) 230-21 MCG/ACT inhaler, Inhale 2 puffs into the lungs 2 (two) times daily., Disp: 360 each, Rfl: 0   guaifenesin (HUMIBID E) 400 MG TABS tablet, Take 1 tablet 3 times daily as needed for chest congestion and cough, Disp: 21 tablet, Rfl: 0   isosorbide mononitrate (IMDUR) 30 MG 24 hr tablet, TAKE  1 TABLET BY MOUTH EVERY DAY, Disp: 90 tablet, Rfl: 3   losartan (COZAAR) 100 MG tablet, TAKE 1 TABLET BY MOUTH EVERY DAY, Disp: 90 tablet, Rfl: 3   metoprolol succinate (TOPROL-XL) 100 MG 24 hr tablet, TAKE 1 TABLET BY MOUTH EVERY DAY WITH OR IMMEDIATELY FOLLOWING A MEAL, Disp: 90 tablet, Rfl: 3   nitroGLYCERIN (NITROSTAT) 0.4 MG SL tablet, Place 1 tablet (0.4 mg total) under the tongue every 5 (five) minutes as needed for chest pain., Disp: 25 tablet, Rfl: 3   omeprazole (PRILOSEC) 10 MG capsule, Take 10 mg by mouth daily., Disp: , Rfl:   Cardiovascular & other pertient studies:  EKG 12/08/2022: Sinus rhythm 72 bpm  Right bundle branch block Old inferior infarct   Exercise Sestamibi stress test 02/28/2021: Exercise nuclear stress test was performed using Bruce protocol. Patient reached 7.0 METS, and 97% of age predicted maximum heart rate. Exercise capacity was low. No chest pain reported. Heart rate and hemodynamic response were normal. Stress EKG revealed no ischemic changes. SPECT images show small sized, mild intensity, reversible perfusion defect in basal inferior/inferoseptal myocardium. Stress LVEF 61%. Low risk study.   Echocardiogram 02/28/2021:  1. Left ventricle cavity is normal in size. Mild concentric hypertrophy of  the left ventricle. Abnormal septal wall  motion due to post-operative coronary  artery bypass graft. Normal LV  systolic function with EF 67%. Doppler  evidence of grade I (impaired) diastolic dysfunction, normal LAP.  2. Structurally normal trileaflet aortic valve. Moderate (Grade II) aortic  regurgitation.  3. No evidence of pulmonary hypertension.   Op note 10/13/2015: Coronary artery bypass grafting x5 (LIMA-LAD, lt radial-OM1, seq SVG-OM2,OM3, SVG-PDA)  Coronary intervention 10/08/2015: Mid RCA lesion, 99% stenosed. Post intervention, there is a 99% residual stenosis.   1. Mid RCA lesion, 99% stenosed. The lesion is type C Diffuse.   Angioplasty: Failed  attempt. The pre-interventional distal flow is normal (TIMI 3). The post-interventional distal flow is decreased (TIMI 2). the intervention was unsuccessful At this lesion, a dissection occurred. IC nitroglycerin was given.    The lesion difficult to cross with Couger Xt, Then used a [employer], but led to proximal ostial RCA. There was timi 1 flow. 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 Right femoral arterial access obtained for potential use of intra-aortic balloon pump. As we waited for surgical team to arrive, ST segments were back to baseline, patient completely asymptomatic, remained hemodynamically stable. Repeat right coronary angiography reveals TIMI 2 flow with proximal dissection. Hence felt urgent surgical revascularization in view of patient being on BRILINTA was not advisable, however the surgical team will continue to follow him in case he needs emergent CABG. Dr. Tharon Aquas Trigt and Dr. Lilly Cove both present and reviewed the angiograms.     2. Ascending aorta shows mild aortic root dilatation without aneurysm formation, abdominal aorta shows diffuse distal abdominal aortic calcification and tortuosity, right external iliac artery appears to show at least 17 or greater percent stenosis.  Recent labs: 06/02/2022: Glucose 98, BUN/Cr 17/1.14. EGFR 73. Na/K 141/4.1. Rest of the CMP normal H/H 15/45. MCV 89. Platelets 203 Chol 74, TG 73, HDL 33, LDL 25  429/2022: Glucose 99, BUN/Cr 17/1.3. EGFR 63. Na/K 143/4.7. Rest of the CMP normal H/H 16/51. MCV 91. Platelets 220 Chol 87, TG 70, HDL 34, LDL 38   Review of Systems  Cardiovascular:  Negative for chest pain,  dyspnea on exertion, leg swelling, palpitations and syncope.  Respiratory:  Positive for shortness of breath.         Vitals:   12/08/22 1352  BP: 137/82  Pulse: 69  Resp: 16  SpO2: 96%     Body mass index is 30.02 kg/m. Filed Weights   12/08/22 1352  Weight: 186 lb (84.4 kg)     Objective:   Physical Exam Vitals and nursing note reviewed.  Constitutional:      General: He is not in acute distress. Neck:     Vascular: No JVD.  Cardiovascular:     Rate and Rhythm: Normal rate and regular rhythm.     Pulses: Normal pulses.     Heart sounds: Normal heart sounds. No murmur heard. Pulmonary:     Effort: Pulmonary effort is normal.     Breath sounds: Normal breath sounds. No wheezing or rales.  Musculoskeletal:     Right lower leg: No edema.     Left lower leg: No edema.           Assessment & Recommendations:   62 year old Caucasian male with coronary artery  disease status post CABG in 2016 (LIMA-LAD, Lt radial-OM1, SVG-OM2,OM3, SVG-PDA), prior unsuccessful PTCA to RCA, hypertension, CKD 3, tobacco abuse  CAD: No angina symptoms. He has random episodes of dyspnea, which are not very reproducible with intense physical activity.  In the past, the similar dyspnea symptoms had improved after starting Imdur.  Question remains if any of the symptoms are related to his CAD.  He has had a mildly abnormal stress test in 2022.  He is concerned given his brother also had rather vague dyspnea symptoms before having had a fatal MI.  I did offer him coronary and bypass graft angiography, anticipating that he may have had at least 1 vein graft occluded in the last 7 years since CABG.  It remains unclear, if any vein graft closure is directly causing his symptoms of random episodes of dyspnea.    He is getting ready to go on an out of state project to [place] for 6 weeks.  After mutual discussion, we have decided to have a follow-up visit in April and discuss if he should proceed  with coronary and bypass graft angiography at that time.  In the meantime, I am asked him to have low threshold to seek immediate medical attention if there is any new chest pain, or worsening shortness of breath.  If and when we decide to proceed with coronary bypass graft angiography, he will need femoral access given use of left radial artery as conduit.  Continue aspirin, statin, Imdur, metoprolol.   Lipids well controlled.  Hypertension: Well controlled.  F/u on 02/02/2023.   Nigel Mormon, MD Pager: [phone] Office: [phone]

## 2023-01-01 ENCOUNTER — Other Ambulatory Visit: Payer: Self-pay | Admitting: Cardiology

## 2023-01-01 DIAGNOSIS — I25118 Atherosclerotic heart disease of native coronary artery with other forms of angina pectoris: Secondary | ICD-10-CM

## 2023-02-02 ENCOUNTER — Encounter: Payer: Self-pay | Admitting: Cardiology

## 2023-02-02 ENCOUNTER — Ambulatory Visit: Payer: 59 | Admitting: Cardiology

## 2023-02-02 VITALS — BP 115/74 | HR 72 | Resp 16 | Ht 66.0 in | Wt 183.0 lb

## 2023-02-02 DIAGNOSIS — I10 Essential (primary) hypertension: Secondary | ICD-10-CM

## 2023-02-02 DIAGNOSIS — R5382 Chronic fatigue, unspecified: Secondary | ICD-10-CM

## 2023-02-02 DIAGNOSIS — R0683 Snoring: Secondary | ICD-10-CM

## 2023-02-02 DIAGNOSIS — J45990 Exercise induced bronchospasm: Secondary | ICD-10-CM

## 2023-02-02 NOTE — Progress Notes (Signed)
Follow up visit  Subjective:   Shawn Chang, male    DOB: 04/25/1961, 62 y.o.   MRN: [id number]   HPI   Chief Complaint  Patient presents with   Coronary Artery Disease   Shortness of Breath   Follow-up    62 year old Caucasian male with coronary artery disease status post CABG in 2016 (LIMA-LAD, Lt radial-OM1, SVG-OM2,OM3, SVG-PDA), prior unsuccessful PTCA to RCA, hypertension, CKD 3  Patient is doing well.  He has not had any recurrent exertional dyspnea symptoms.  He does not have any chest pain, but also did not have any chest pain prior to his MI in 2016.    Patient's spouse is here today, and is concerned about patient's snoring, as well as diet.  He does report daytime fatigue symptoms.   Current Outpatient Medications:    albuterol (VENTOLIN HFA) 108 (90 Base) MCG/ACT inhaler, Inhale 2 puffs into the lungs every 6 (six) hours as needed for wheezing or shortness of breath (Cough)., Disp: 36 g, Rfl: 2   amLODipine (NORVASC) 5 MG tablet, TAKE 1 TABLET BY MOUTH EVERY DAY, Disp: 90 tablet, Rfl: 1   aspirin EC 81 MG tablet, Take 81 mg by mouth daily., Disp: , Rfl:    atorvastatin (LIPITOR) 40 MG tablet, TAKE 1 TABLET BY MOUTH EVERY DAY, Disp: 90 tablet, Rfl: 1   cetirizine (ZYRTEC ALLERGY) 10 MG tablet, Take 1 tablet (10 mg total) by mouth at bedtime., Disp: 90 tablet, Rfl: 1   fluticasone (FLONASE) 50 MCG/ACT nasal spray, Place 1 spray into both nostrils daily. Begin by using 2 sprays in each nare daily for 3 to 5 days, then decrease to 1 spray in each nare daily., Disp: 48 mL, Rfl: 1   fluticasone-salmeterol (ADVAIR HFA) 230-21 MCG/ACT inhaler, Inhale 2 puffs into the lungs 2 (two) times daily., Disp: 360 each, Rfl: 0   guaifenesin (HUMIBID E) 400 MG TABS tablet, Take 1 tablet 3 times daily as needed for chest congestion and cough, Disp: 21 tablet, Rfl: 0   isosorbide mononitrate (IMDUR) 30 MG 24 hr tablet, TAKE 1 TABLET BY MOUTH EVERY DAY, Disp: 90 tablet, Rfl: 3   losartan  (COZAAR) 100 MG tablet, TAKE 1 TABLET BY MOUTH EVERY DAY, Disp: 90 tablet, Rfl: 3   metoprolol succinate (TOPROL-XL) 100 MG 24 hr tablet, TAKE 1 TABLET BY MOUTH EVERY DAY WITH OR IMMEDIATELY FOLLOWING A MEAL, Disp: 90 tablet, Rfl: 3   nitroGLYCERIN (NITROSTAT) 0.4 MG SL tablet, Place 1 tablet (0.4 mg total) under the tongue every 5 (five) minutes as needed for chest pain., Disp: 25 tablet, Rfl: 3   omeprazole (PRILOSEC) 10 MG capsule, Take 10 mg by mouth daily., Disp: , Rfl:   Cardiovascular & other pertient studies:  EKG 12/08/2022: Sinus rhythm 72 bpm  Right bundle branch block Old inferior infarct   Exercise Sestamibi stress test 02/28/2021: Exercise nuclear stress test was performed using Bruce protocol. Patient reached 7.0 METS, and 97% of age predicted maximum heart rate. Exercise capacity was low. No chest pain reported. Heart rate and hemodynamic response were normal. Stress EKG revealed no ischemic changes. SPECT images show small sized, mild intensity, reversible perfusion defect in basal inferior/inferoseptal myocardium. Stress LVEF 61%. Low risk study.   Echocardiogram 02/28/2021:  1. Left ventricle cavity is normal in size. Mild concentric hypertrophy of  the left ventricle. Abnormal septal wall  motion due to post-operative coronary artery bypass graft. Normal LV  systolic function with EF 67%. Doppler  evidence  of grade I (impaired) diastolic dysfunction, normal LAP.  2. Structurally normal trileaflet aortic valve. Moderate (Grade II) aortic  regurgitation.  3. No evidence of pulmonary hypertension.   Op note 10/13/2015: Coronary artery bypass grafting x5 (LIMA-LAD, lt radial-OM1, seq SVG-OM2,OM3, SVG-PDA)  Coronary intervention 10/08/2015: Mid RCA lesion, 99% stenosed. Post intervention, there is a 99% residual stenosis.   1. Mid RCA lesion, 99% stenosed. The lesion is type C Diffuse.   Angioplasty: Failed attempt. The pre-interventional distal flow is normal (TIMI 3). The  post-interventional distal flow is decreased (TIMI 2). the intervention was unsuccessful At this lesion, a dissection occurred. IC nitroglycerin was given.    The lesion difficult to cross with Couger Xt, Then used a [employer] XT, but led to proximal ostial RCA. There was timi 1 flow. 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 Right femoral arterial access obtained for potential use of intra-aortic balloon pump. As we waited for surgical team to arrive, ST segments were back to baseline, patient completely asymptomatic, remained hemodynamically stable. Repeat right coronary angiography reveals TIMI 2 flow with proximal dissection. Hence felt urgent surgical revascularization in view of patient being on BRILINTA was not advisable, however the surgical team will continue to follow him in case he needs emergent CABG. Dr. Kathlee NationsPeter Van Trigt and Dr. Ashley Marinerub Owen both present and reviewed the angiograms.     2. Ascending aorta shows mild aortic root dilatation without aneurysm formation, abdominal aorta shows diffuse distal abdominal aortic calcification and tortuosity, right external iliac artery appears to show at least 50 or greater percent stenosis.  Recent labs: 06/02/2022: Glucose 98, BUN/Cr 17/1.14. EGFR 73. Na/K 141/4.1. Rest of the CMP normal H/H 15/45. MCV 89. Platelets 203 Chol 74, TG 73, HDL 33, LDL 25  429/2022: Glucose 99, BUN/Cr 17/1.3. EGFR 63. Na/K 143/4.7. Rest of the CMP normal H/H 16/51. MCV 91. Platelets 220 Chol 87, TG 70, HDL 34, LDL 38   Review of Systems  Cardiovascular:  Negative for chest pain, dyspnea on exertion, leg swelling, palpitations and syncope.   Respiratory:  Negative for shortness of breath.         Vitals:   02/02/23 1344  BP: 115/74  Pulse: 72  Resp: 16  SpO2: 96%     Body mass index is 29.54 kg/m. Filed Weights   02/02/23 1344  Weight: 183 lb (83 kg)     Objective:   Physical Exam Vitals and nursing note reviewed.  Constitutional:      General: He is not in acute distress. Neck:     Vascular: No JVD.  Cardiovascular:     Rate and Rhythm: Normal rate and regular rhythm.     Pulses: Normal pulses.     Heart sounds: Normal heart sounds. No murmur heard. Pulmonary:     Effort: Pulmonary effort is normal.     Breath sounds: Normal breath sounds. No wheezing or rales.  Musculoskeletal:     Right lower leg: No edema.     Left lower leg: No edema.          Assessment & Recommendations:   62 year old Caucasian male with coronary artery disease status post CABG in 2016 (LIMA-LAD, Lt radial-OM1, SVG-OM2,OM3, SVG-PDA), prior unsuccessful PTCA to  RCA, hypertension, CKD 3, tobacco abuse  CAD: No specific angina or angina equivalent symptoms at this time. Continue medical treatment. If he has any recurrent exertional dyspnea, I would recommend coronary bypass graft angiography further evaluation, given that he has had a mildly abnormal stress test in 2022. Continue aspirin, statin, current antianginal therapy.  We did spend significant amount of time today discussing diet and lifestyle.  We talked about heart healthy Mediterranean style diet, and cutting down on intake of dairy, sugar, and processed food.  Given patient's snoring and fatigue symptoms, I referred him for sleep study.  Hypertension: Well controlled.  Time spent: 30-minute  F/u in 6 months   Elder Negus, MD Pager: [phone] Office: [phone]

## 2023-02-06 ENCOUNTER — Other Ambulatory Visit: Payer: Self-pay | Admitting: Cardiology

## 2023-02-06 DIAGNOSIS — I10 Essential (primary) hypertension: Secondary | ICD-10-CM

## 2023-02-24 ENCOUNTER — Ambulatory Visit
Admission: EM | Admit: 2023-02-24 | Discharge: 2023-02-24 | Disposition: A | Discharge status: Home / Self Care | Payer: Managed Care, Other (non HMO) | Attending: Internal Medicine | Admitting: Internal Medicine

## 2023-02-24 DIAGNOSIS — J309 Allergic rhinitis, unspecified: Secondary | ICD-10-CM | POA: Diagnosis not present

## 2023-02-24 DIAGNOSIS — J4530 Mild persistent asthma, uncomplicated: Secondary | ICD-10-CM

## 2023-02-24 DIAGNOSIS — J040 Acute laryngitis: Secondary | ICD-10-CM

## 2023-02-24 DIAGNOSIS — J329 Chronic sinusitis, unspecified: Secondary | ICD-10-CM | POA: Diagnosis not present

## 2023-02-24 LAB — POCT RAPID STREP A (OFFICE): Rapid Strep A Screen: NEGATIVE

## 2023-02-24 MED ORDER — AMOXICILLIN-POT CLAVULANATE 875-125 MG PO TABS: 1.0000 | ORAL_TABLET | Freq: Two times a day (BID) | ORAL | 0 refills | Status: DC

## 2023-02-24 MED ORDER — PROMETHAZINE-DM 6.25-15 MG/5ML PO SYRP: 5.0000 mL | ORAL_SOLUTION | Freq: Three times a day (TID) | ORAL | 0 refills | Status: DC | PRN

## 2023-02-24 MED ORDER — PREDNISONE 10 MG PO TABS: 30.0000 mg | ORAL_TABLET | Freq: Every day | ORAL | 0 refills | Status: DC

## 2023-02-24 NOTE — ED Provider Notes (Signed)
[facility]  Note:  This document was prepared using Conservation officer, historic buildings and may include unintentional dictation errors.  MRN: [id number] DOB: 05-Feb-1961  Subjective:   Shawn Chang is a 61 y.o. male presenting for 1 week history of persistent throat pain, hoarseness, bilateral ear fullness, sinus drainage. Started to cough in the past day. No chest pain, shob, wheezing. Has been using his Zyrtec, albuterol for allergic rhinitis and asthma. No fever, ear drainage. No history of chf, heart surgeries in the past year. No smoking of any kind including cigarettes, cigars, vaping, marijuana use.    No current facility-administered medications for this encounter.  Current Outpatient Medications:    albuterol (VENTOLIN HFA) 108 (90 Base) MCG/ACT inhaler, Inhale 2 puffs into the lungs every 6 (six) hours as needed for wheezing or shortness of breath (Cough)., Disp: 36 g, Rfl: 2   amLODipine (NORVASC) 5 MG tablet, TAKE 1 TABLET BY MOUTH EVERY DAY, Disp: 30 tablet, Rfl: 3   aspirin EC 81 MG tablet, Take 81 mg by mouth daily., Disp: , Rfl:    atorvastatin (LIPITOR) 40 MG tablet, TAKE 1 TABLET BY MOUTH EVERY DAY, Disp: 90 tablet, Rfl: 1   cetirizine (ZYRTEC ALLERGY) 10 MG tablet, Take 1 tablet (10 mg total) by mouth at bedtime., Disp: 90 tablet, Rfl: 1   fluticasone (FLONASE) 50 MCG/ACT nasal spray, Place 1 spray into both nostrils daily. Begin by using 2 sprays in each nare daily for 3 to 5 days, then decrease to 1 spray in each nare daily., Disp: 48 mL, Rfl: 1   fluticasone-salmeterol (ADVAIR HFA) 230-21 MCG/ACT inhaler, Inhale 2 puffs into the lungs 2 (two) times daily., Disp: 360 each, Rfl: 0   guaifenesin (HUMIBID E) 400 MG TABS tablet, Take 1 tablet 3 times daily as needed for chest congestion and cough, Disp: 21 tablet, Rfl: 0   isosorbide mononitrate (IMDUR) 30 MG 24 hr tablet, TAKE 1 TABLET BY MOUTH EVERY DAY, Disp: 90 tablet, Rfl: 3   losartan (COZAAR)  100 MG tablet, TAKE 1 TABLET BY MOUTH EVERY DAY, Disp: 90 tablet, Rfl: 3   metoprolol succinate (TOPROL-XL) 100 MG 24 hr tablet, TAKE 1 TABLET BY MOUTH EVERY DAY WITH OR IMMEDIATELY FOLLOWING A MEAL, Disp: 90 tablet, Rfl: 3   nitroGLYCERIN (NITROSTAT) 0.4 MG SL tablet, Place 1 tablet (0.4 mg total) under the tongue every 5 (five) minutes as needed for chest pain., Disp: 25 tablet, Rfl: 3   omeprazole (PRILOSEC) 10 MG capsule, Take 10 mg by mouth daily., Disp: , Rfl:    Allergies  Allergen Reactions   Other Shortness Of Breath    Dog dander: Allergy-induced asthma   Morphine Nausea And Vomiting   Morphine And Related Nausea And Vomiting    Past Medical History:  Diagnosis Date   Acid reflux    Asthma    Coronary artery disease    Hyperlipidemia    Hypertension    Myocardial infarction [facility])    Retinal detachment      Past Surgical History:  Procedure Laterality Date   CARDIAC CATHETERIZATION N/A 10/01/2015   Procedure: Left Heart Cath and Coronary Angiography;  Surgeon: Yates Decamp, MD;  Location: [facility] INVASIVE CV LAB;  Service: Cardiovascular;  Laterality: N/A;   CARDIAC CATHETERIZATION N/A 10/08/2015   Procedure: Coronary Stent Intervention;  Surgeon: Yates Decamp, MD;  Location: [facility] INVASIVE CV LAB;  Service: Cardiovascular;  Laterality: N/A;   CORONARY ARTERY BYPASS GRAFT N/A 10/12/2015  Procedure: CORONARY ARTERY BYPASS GRAFTING (CABG) x five , using left internal mammary artery and bilateral legs greater saphenous vein harvested endoscopically;  Surgeon: Kerin Perna, MD;  Location: [facility] OR;  Service: Open Heart Surgery;  Laterality: N/A;   CORONARY ARTERY BYPASS GRAFT     KNEE SURGERY     PERIPHERAL VASCULAR CATHETERIZATION  10/08/2015   Procedure: Abdominal Aortogram;  Surgeon: Yates Decamp, MD;  Location: [facility] INVASIVE CV LAB;  Service: Cardiovascular;;   PERIPHERAL VASCULAR CATHETERIZATION  10/08/2015   Procedure: Aortic Arch Angiography;  Surgeon: Yates Decamp, MD;  Location: [facility] INVASIVE  CV LAB;  Service: Cardiovascular;;   shoulder surgery     TEE WITHOUT CARDIOVERSION N/A 10/12/2015   Procedure: TRANSESOPHAGEAL ECHOCARDIOGRAM (TEE);  Surgeon: Kerin Perna, MD;  Location: [facility] OR;  Service: Open Heart Surgery;  Laterality: N/A;   TONSILLECTOMY     Pt has also had sinus and throat surgery    Family History  Problem Relation Age of Onset   Heart disease Mother        May 2017   Heart disease Father    Hypertension Sister    Stroke Sister    Heart attack Brother        massive, survived   Heart attack Brother     Social History   Tobacco Use   Smoking status: Former    Packs/day: 1.50    Years: 39.00    Additional pack years: 0.00    Total pack years: 58.50    Types: Cigarettes    Quit date: 08/31/2015    Years since quitting: 7.4   Smokeless tobacco: Never  Vaping Use   Vaping Use: Never used  Substance Use Topics   Alcohol use: Yes    Alcohol/week: 0.0 standard drinks of alcohol    Comment: 1-3 beers per week   Drug use: No    ROS   Objective:   Vitals: BP (!) 159/88 (BP Location: Right Arm)   Pulse 83   Temp 98.1 F (36.7 C) (Oral)   Resp 20   SpO2 94%   Physical Exam Constitutional:      General: He is not in acute distress.    Appearance: Normal appearance. He is well-developed and normal weight. He is not ill-appearing, toxic-appearing or diaphoretic.  HENT:     Head: Normocephalic and atraumatic.     Right Ear: Ear canal and external ear normal. No drainage, swelling or tenderness. A middle ear effusion is present. There is no impacted cerumen. Tympanic membrane is not erythematous or bulging.     Left Ear: Ear canal and external ear normal. No drainage, swelling or tenderness. A middle ear effusion is present. There is no impacted cerumen. Tympanic membrane is not erythematous or bulging.     Nose: Congestion present. No rhinorrhea.     Mouth/Throat:     Mouth: Mucous membranes are moist.     Pharynx: No pharyngeal swelling,  oropharyngeal exudate, posterior oropharyngeal erythema or uvula swelling.     Tonsils: No tonsillar exudate or tonsillar abscesses. 0 on the right. 0 on the left.  Eyes:     General: No scleral icterus.       Right eye: No discharge.        Left eye: No discharge.     Extraocular Movements: Extraocular movements intact.     Conjunctiva/sclera: Conjunctivae normal.  Cardiovascular:     Rate and Rhythm: Normal rate and regular rhythm.     Heart  sounds: Normal heart sounds. No murmur heard.    No friction rub. No gallop.  Pulmonary:     Effort: Pulmonary effort is normal. No respiratory distress.     Breath sounds: Normal breath sounds. No stridor. No wheezing, rhonchi or rales.  Musculoskeletal:     Cervical back: Normal range of motion and neck supple. No rigidity. No muscular tenderness.  Neurological:     General: No focal deficit present.     Mental Status: He is alert and oriented to person, place, and time.  Psychiatric:        Mood and Affect: Mood normal.        Behavior: Behavior normal.        Thought Content: Thought content normal.        Judgment: Judgment normal.    Results for orders placed or performed during the hospital encounter of 02/24/23 (from the past 24 hour(s))  POCT rapid strep A     Status: None   Collection Time: 02/24/23  9:25 AM  Result Value Ref Range   Rapid Strep A Screen Negative Negative   Assessment and Plan :   PDMP not reviewed this encounter.  1. Recurrent sinusitis   2. Laryngitis   3. Allergic rhinitis, unspecified seasonality, unspecified trigger   4. Mild persistent asthma, uncomplicated    In the context of his asthma, allergic rhinitis and likely ETD recommended an oral prednisone course. Will start empiric treatment for sinusitis with Augmentin.  Recommended supportive care otherwise. Deferred imaging given clear cardiopulmonary exam, hemodynamically stable vital signs. Counseled patient on potential for adverse effects with  medications prescribed/recommended today, ER and return-to-clinic precautions discussed, patient verbalized understanding.    Wallis Bamberg, [place] 02/24/23 [phone]

## 2023-02-24 NOTE — Discharge Instructions (Addendum)
We will manage this as a sinus infection with Augmentin. We will use prednisone for sinus inflammation, asthma. For sore throat or cough try using a honey-based tea. Use 3 teaspoons of honey with juice squeezed from half lemon. Place shaved pieces of ginger into 1/2-1 cup of water and warm over stove top. Then mix the ingredients and repeat every 4 hours as needed. Please take Tylenol 500mg -650mg  every 6 hours for throat pain, fevers, aches and pains. Hydrate very well with at least 2 liters of water. Eat light meals such as soups (chicken and noodles, vegetable, chicken and wild rice).  Do not eat foods that you are allergic to.  Continue to take Zyrtec can help against postnasal drainage, sinus congestion which can cause sinus pain, sinus headaches, throat pain, painful swallowing, coughing.  Use cough medication as needed.

## 2023-02-24 NOTE — ED Triage Notes (Signed)
Pt c/o sore throat, bilat earache, "losing my voice" sx started 6 days ago-NAD-steady gait

## 2023-05-09 ENCOUNTER — Other Ambulatory Visit: Payer: Self-pay | Admitting: Cardiology

## 2023-05-09 DIAGNOSIS — I10 Essential (primary) hypertension: Secondary | ICD-10-CM

## 2023-05-10 ENCOUNTER — Other Ambulatory Visit: Payer: Self-pay | Admitting: Cardiology

## 2023-05-10 DIAGNOSIS — I10 Essential (primary) hypertension: Secondary | ICD-10-CM

## 2023-06-30 ENCOUNTER — Ambulatory Visit
Admission: EM | Admit: 2023-06-30 | Discharge: 2023-06-30 | Disposition: A | Discharge status: Home / Self Care | Payer: Managed Care, Other (non HMO) | Attending: Internal Medicine | Admitting: Internal Medicine

## 2023-06-30 ENCOUNTER — Ambulatory Visit (INDEPENDENT_AMBULATORY_CARE_PROVIDER_SITE_OTHER): Payer: 59

## 2023-06-30 DIAGNOSIS — R051 Acute cough: Secondary | ICD-10-CM

## 2023-06-30 DIAGNOSIS — J209 Acute bronchitis, unspecified: Secondary | ICD-10-CM | POA: Diagnosis not present

## 2023-06-30 DIAGNOSIS — Z1152 Encounter for screening for COVID-19: Secondary | ICD-10-CM | POA: Diagnosis not present

## 2023-06-30 MED ORDER — ALBUTEROL SULFATE HFA 108 (90 BASE) MCG/ACT IN AERS: 2.0000 | INHALATION_SPRAY | Freq: Four times a day (QID) | RESPIRATORY_TRACT | 0 refills | Status: DC | PRN

## 2023-06-30 MED ORDER — PREDNISONE 20 MG PO TABS: ORAL_TABLET | ORAL | 0 refills | Status: DC

## 2023-06-30 MED ORDER — IPRATROPIUM-ALBUTEROL 0.5-2.5 (3) MG/3ML IN SOLN
3.0000 mL | Freq: Once | RESPIRATORY_TRACT | Status: AC
  Administered 2023-06-30: 3 mL via RESPIRATORY_TRACT

## 2023-06-30 NOTE — ED Triage Notes (Signed)
Pt reports cough, chest congestion and "head congestion" x 4 days. OTC meds gives some relief.

## 2023-06-30 NOTE — ED Provider Notes (Signed)
[facility]  Note:  This document was prepared using Conservation officer, historic buildings and may include unintentional dictation errors.  MRN: [id number] DOB: Dec 20, 1960  Subjective:   Shawn Chang is a 62 y.o. male presenting for 4-day history of acute onset recurrent chest congestion, chest tightness, sinus congestion and pressure, persistent coughing.  Has a history of asthma and allergies.  Patient has typically done well getting breathing treatments.  He has a history of sinus infections.  Pleas Koch a lot and would like to make sure about a COVID test.  No smoking of any kind including cigarettes, cigars, vaping, marijuana use.    No current facility-administered medications for this encounter.  Current Outpatient Medications:    albuterol (VENTOLIN HFA) 108 (90 Base) MCG/ACT inhaler, Inhale 2 puffs into the lungs every 6 (six) hours as needed for wheezing or shortness of breath (Cough)., Disp: 36 g, Rfl: 2   amLODipine (NORVASC) 5 MG tablet, TAKE 1 TABLET BY MOUTH EVERY DAY, Disp: 90 tablet, Rfl: 3   amoxicillin-clavulanate (AUGMENTIN) 875-125 MG tablet, Take 1 tablet by mouth 2 (two) times daily., Disp: 14 tablet, Rfl: 0   aspirin EC 81 MG tablet, Take 81 mg by mouth daily., Disp: , Rfl:    atorvastatin (LIPITOR) 40 MG tablet, TAKE 1 TABLET BY MOUTH EVERY DAY, Disp: 30 tablet, Rfl: 5   cetirizine (ZYRTEC ALLERGY) 10 MG tablet, Take 1 tablet (10 mg total) by mouth at bedtime., Disp: 90 tablet, Rfl: 1   fluticasone (FLONASE) 50 MCG/ACT nasal spray, Place 1 spray into both nostrils daily. Begin by using 2 sprays in each nare daily for 3 to 5 days, then decrease to 1 spray in each nare daily., Disp: 48 mL, Rfl: 1   fluticasone-salmeterol (ADVAIR HFA) 230-21 MCG/ACT inhaler, Inhale 2 puffs into the lungs 2 (two) times daily., Disp: 360 each, Rfl: 0   guaifenesin (HUMIBID E) 400 MG TABS tablet, Take 1 tablet 3 times daily as needed for chest congestion and cough, Disp: 21  tablet, Rfl: 0   isosorbide mononitrate (IMDUR) 30 MG 24 hr tablet, TAKE 1 TABLET BY MOUTH EVERY DAY, Disp: 90 tablet, Rfl: 3   losartan (COZAAR) 100 MG tablet, TAKE 1 TABLET BY MOUTH EVERY DAY, Disp: 90 tablet, Rfl: 3   metoprolol succinate (TOPROL-XL) 100 MG 24 hr tablet, TAKE 1 TABLET BY MOUTH EVERY DAY WITH OR IMMEDIATELY FOLLOWING A MEAL, Disp: 90 tablet, Rfl: 3   nitroGLYCERIN (NITROSTAT) 0.4 MG SL tablet, Place 1 tablet (0.4 mg total) under the tongue every 5 (five) minutes as needed for chest pain., Disp: 25 tablet, Rfl: 3   omeprazole (PRILOSEC) 10 MG capsule, Take 10 mg by mouth daily., Disp: , Rfl:    predniSONE (DELTASONE) 10 MG tablet, Take 3 tablets (30 mg total) by mouth daily with breakfast., Disp: 15 tablet, Rfl: 0   promethazine-dextromethorphan (PROMETHAZINE-DM) 6.25-15 MG/5ML syrup, Take 5 mLs by mouth 3 (three) times daily as needed for cough., Disp: 200 mL, Rfl: 0   Allergies  Allergen Reactions   Other Shortness Of Breath    Dog dander: Allergy-induced asthma   Morphine Nausea And Vomiting   Morphine And Codeine Nausea And Vomiting    Past Medical History:  Diagnosis Date   Acid reflux    Asthma    Coronary artery disease    Hyperlipidemia    Hypertension    Myocardial infarction [facility])    Retinal detachment      Past Surgical History:  Procedure Laterality Date   CARDIAC CATHETERIZATION N/A 10/01/2015   Procedure: Left Heart Cath and Coronary Angiography;  Surgeon: Yates Decamp, MD;  Location: [facility] INVASIVE CV LAB;  Service: Cardiovascular;  Laterality: N/A;   CARDIAC CATHETERIZATION N/A 10/08/2015   Procedure: Coronary Stent Intervention;  Surgeon: Yates Decamp, MD;  Location: [facility] INVASIVE CV LAB;  Service: Cardiovascular;  Laterality: N/A;   CORONARY ARTERY BYPASS GRAFT N/A 10/12/2015   Procedure: CORONARY ARTERY BYPASS GRAFTING (CABG) x five , using left internal mammary artery and bilateral legs greater saphenous vein harvested endoscopically;  Surgeon: Kerin Perna, MD;  Location: [facility] OR;  Service: Open Heart Surgery;  Laterality: N/A;   CORONARY ARTERY BYPASS GRAFT     KNEE SURGERY     PERIPHERAL VASCULAR CATHETERIZATION  10/08/2015   Procedure: Abdominal Aortogram;  Surgeon: Yates Decamp, MD;  Location: [facility] INVASIVE CV LAB;  Service: Cardiovascular;;   PERIPHERAL VASCULAR CATHETERIZATION  10/08/2015   Procedure: Aortic Arch Angiography;  Surgeon: Yates Decamp, MD;  Location: [facility] INVASIVE CV LAB;  Service: Cardiovascular;;   shoulder surgery     TEE WITHOUT CARDIOVERSION N/A 10/12/2015   Procedure: TRANSESOPHAGEAL ECHOCARDIOGRAM (TEE);  Surgeon: Kerin Perna, MD;  Location: [facility] OR;  Service: Open Heart Surgery;  Laterality: N/A;   TONSILLECTOMY     Pt has also had sinus and throat surgery    Family History  Problem Relation Age of Onset   Heart disease Mother        May 2017   Heart disease Father    Hypertension Sister    Stroke Sister    Heart attack Brother        massive, survived   Heart attack Brother     Social History   Tobacco Use   Smoking status: Former    Current packs/day: 0.00    Average packs/day: 1.5 packs/day for 39.0 years (58.5 ttl pk-yrs)    Types: Cigarettes    Start date: 08/30/1976    Quit date: 08/31/2015    Years since quitting: 7.8   Smokeless tobacco: Never  Vaping Use   Vaping status: Never Used  Substance Use Topics   Alcohol use: Yes    Alcohol/week: 0.0 standard drinks of alcohol    Comment: 1-3 beers per week   Drug use: No    ROS   Objective:   Vitals: BP (!) 168/85 (BP Location: Right Arm)   Pulse 100   Temp 98 F (36.7 C) (Oral)   Resp 18   SpO2 92%   Physical Exam Constitutional:      General: He is not in acute distress.    Appearance: Normal appearance. He is well-developed. He is not ill-appearing, toxic-appearing or diaphoretic.  HENT:     Head: Normocephalic and atraumatic.     Right Ear: External ear normal.     Left Ear: External ear normal.     Nose: Nose normal.      Mouth/Throat:     Mouth: Mucous membranes are moist.  Eyes:     General: No scleral icterus.       Right eye: No discharge.        Left eye: No discharge.     Extraocular Movements: Extraocular movements intact.  Cardiovascular:     Rate and Rhythm: Normal rate and regular rhythm.     Heart sounds: Normal heart sounds. No murmur heard.    No friction rub. No gallop.  Pulmonary:     Effort: Pulmonary effort  is normal. No respiratory distress.     Breath sounds: Normal breath sounds. No stridor. No wheezing, rhonchi or rales.  Neurological:     Mental Status: He is alert and oriented to person, place, and time.  Psychiatric:        Mood and Affect: Mood normal.        Behavior: Behavior normal.        Thought Content: Thought content normal.    DG Chest 2 View  Result Date: 06/30/2023 CLINICAL DATA:  Wheezing with cough, congestion and bronchospasm for 4 days. EXAM: CHEST - 2 VIEW COMPARISON:  Radiographs 10/24/2017 and 02/16/2016.  CT 03/03/2016. FINDINGS: The heart size and mediastinal contours are stable status post median sternotomy and CABG. There is possible mild central airway thickening, but no edema, confluent airspace opacity, pleural effusion or pneumothorax. The bones appear unremarkable. IMPRESSION: Possible mild central airway thickening. No evidence of pneumonia or edema. Electronically Signed   By: Carey Bullocks M.D.   On: 06/30/2023 11:17    A 0.5 mg - 2.5 mg ipratropium albuterol nebulizer treatment was administered in clinic.  Assessment and Plan :   PDMP not reviewed this encounter.  1. Acute bronchitis, unspecified organism   2. Encounter for screening for COVID-19    Deferred antibiotic use.  Chest x-ray negative for pneumonia.  Will manage for acute bronchitis.  COVID-19 testing pending.  Recommend Paxlovid should patient test positive for this.  Counseled patient on potential for adverse effects with medications prescribed/recommended today, ER and  return-to-clinic precautions discussed, patient verbalized understanding.    Wallis Bamberg, [place] 06/30/23 1345

## 2023-06-30 NOTE — Discharge Instructions (Addendum)
Your COVID test is pending.  If you test positive for COVID-19, I do recommend taking Paxlovid as a COVID medication that can help you.  Otherwise, start taking the prednisone to help you with your bronchitis in the context of her asthma.  Use the albuterol inhaler and get plenty of rest with fluids.  If you continue to have symptoms and have no improvement come Wednesday or Thursday, call the clinic and let me know.  It would be appropriate to use an antibiotic for recurrent sinus infection at that time.  Otherwise if you continue to get better then there would be no need for an antibiotic.

## 2023-07-01 LAB — SARS CORONAVIRUS 2 (TAT 6-24 HRS): SARS Coronavirus 2: NEGATIVE

## 2023-07-27 ENCOUNTER — Other Ambulatory Visit: Payer: Self-pay | Admitting: Cardiology

## 2023-07-27 DIAGNOSIS — I10 Essential (primary) hypertension: Secondary | ICD-10-CM

## 2023-08-03 ENCOUNTER — Ambulatory Visit: Payer: 59 | Admitting: Cardiology

## 2023-08-14 ENCOUNTER — Ambulatory Visit: Payer: 59 | Attending: Cardiology | Admitting: Cardiology

## 2023-08-23 ENCOUNTER — Encounter: Payer: Self-pay | Admitting: Cardiology

## 2023-08-23 ENCOUNTER — Ambulatory Visit (INDEPENDENT_AMBULATORY_CARE_PROVIDER_SITE_OTHER): Payer: 59

## 2023-08-23 ENCOUNTER — Ambulatory Visit: Payer: 59 | Attending: Cardiology | Admitting: Cardiology

## 2023-08-23 VITALS — BP 118/66 | HR 74 | Resp 16 | Ht 66.0 in | Wt 177.0 lb

## 2023-08-23 DIAGNOSIS — R55 Syncope and collapse: Secondary | ICD-10-CM | POA: Diagnosis not present

## 2023-08-23 DIAGNOSIS — I25118 Atherosclerotic heart disease of native coronary artery with other forms of angina pectoris: Secondary | ICD-10-CM | POA: Diagnosis not present

## 2023-08-23 DIAGNOSIS — I10 Essential (primary) hypertension: Secondary | ICD-10-CM

## 2023-08-23 DIAGNOSIS — I2510 Atherosclerotic heart disease of native coronary artery without angina pectoris: Secondary | ICD-10-CM

## 2023-08-23 DIAGNOSIS — I951 Orthostatic hypotension: Secondary | ICD-10-CM

## 2023-08-23 DIAGNOSIS — Z951 Presence of aortocoronary bypass graft: Secondary | ICD-10-CM | POA: Diagnosis not present

## 2023-08-23 MED ORDER — LOSARTAN POTASSIUM 100 MG PO TABS: 100.0000 mg | ORAL_TABLET | Freq: Every evening | ORAL | Status: DC

## 2023-08-23 MED ORDER — METOPROLOL SUCCINATE ER 100 MG PO TB24: 100.0000 mg | ORAL_TABLET | Freq: Every morning | ORAL | Status: DC

## 2023-08-23 MED ORDER — ISOSORBIDE MONONITRATE ER 30 MG PO TB24: 15.0000 mg | ORAL_TABLET | Freq: Every morning | ORAL | Status: DC

## 2023-08-23 MED ORDER — NITROGLYCERIN 0.4 MG SL SUBL: 0.4000 mg | SUBLINGUAL_TABLET | SUBLINGUAL | 6 refills | Status: DC | PRN

## 2023-08-23 NOTE — Progress Notes (Unsigned)
Enrolled patient for a 14 day Zio XT monitor to be mailed to patients home home

## 2023-08-23 NOTE — Progress Notes (Signed)
Cardiology Office Note:  .   Date:  08/23/2023  ID:  Shawn Chang, DOB [birth year], MRN [id number] PCP:  Patient, No Pcp Per  [facility] Providers Cardiologist: Dr. Truett Mainland Electrophysiologist:  None  Click to update primary MD,subspecialty MD or APP then REFRESH:1}    Chief Complaint  Patient presents with   Loss of Consciousness    History of Present Illness: .   Shawn Chang is a 62 y.o. Caucasian male whose past medical history and cardiovascular risk factors includes: coronary artery disease status post CABG in 2016 (LIMA-LAD, Lt radial-OM1, SVG-OM2,OM3, SVG-PDA), prior unsuccessful PTCA to RCA, hypertension, CKD 3.  Patient comes in for an acute visit due to syncope and being seen by DOD.   Patient states that he used to weigh 188 pounds.  At the last office visit he was encouraged to lose weight.  He has been implementing lifestyle changes and his recent weight this morning was 173 pounds.  In the past he was started on isosorbide mononitrate and since then has been having episodes of head rush and feeling dizzy at times.  However earlier this week on Tuesday he was at work leaning forward removing 2 screws and when he stood up in an erect position he started feeling the head rush again, vision changes, and lost consciousness (duration unknown).  This was an unwitnessed event.  He did not injure himself.  He assumes that he lost consciousness slowly.  When he regained consciousness he sat there for a while and after 15 minutes or so stood up felt okay and went to urgent care.  At the urgent care he was diagnosed with orthostatic hypotension and was advised to go to the emergency room department.  Patient chose not to go to ER.  Once he is back in town he requested to be seen for evaluation.  No reoccurrence of near-syncope or syncopal event.  Patient states that the office blood pressures is the lowest he has seen in many years.  Denies anginal chest pain.   No use of sublingual nitroglycerin tablets.  Requesting a refill.  Review of Systems: .   Review of Systems  Cardiovascular:  Positive for syncope. Negative for chest pain, claudication, dyspnea on exertion, irregular heartbeat, leg swelling, near-syncope, orthopnea, palpitations and paroxysmal nocturnal dyspnea.  Respiratory:  Negative for shortness of breath.   Hematologic/Lymphatic: Negative for bleeding problem.  Musculoskeletal:  Negative for muscle cramps and myalgias.  Neurological:  Negative for dizziness and light-headedness.    Studies Reviewed:   EKG: EKG Interpretation Date/Time:  Thursday August 23 2023 16:15:52 EDT Ventricular Rate:  70 PR Interval:  190 QRS Duration:  142 QT Interval:  410 QTC Calculation: 442 R Axis:   5  Text Interpretation: Normal sinus rhythm Right bundle branch block When compared with ECG of 24-Oct-2017 19:46, Right bundle branch block is now Present Confirmed by Tessa Lerner [id number]) on 08/23/2023 4:23:54 PM  Echocardiogram: 02/28/2021:  1. Left ventricle cavity is normal in size. Mild concentric hypertrophy of  the left ventricle. Abnormal septal wall  motion due to post-operative coronary artery bypass graft. Normal LV  systolic function with EF 67%. Doppler  evidence of grade I (impaired) diastolic dysfunction, normal LAP.  2. Structurally normal trileaflet aortic valve. Moderate (Grade II) aortic  regurgitation.  3. No evidence of pulmonary hypertension.   Op note 10/13/2015: Coronary artery bypass grafting x5 (LIMA-LAD, lt radial-OM1, seq SVG-OM2,OM3, SVG-PDA)  RADIOLOGY: None  Risk Assessment/Calculations:   None  Labs:       Latest Ref Rng & Units 06/02/2022    7:57 AM 02/25/2021    8:44 AM 10/24/2017    7:55 PM  CBC  WBC 3.4 - 10.8 x10E3/uL 7.3  8.2  10.5   Hemoglobin 13.0 - 17.7 g/dL 74.2  59.5  63.8   Hematocrit 37.5 - 51.0 % 45.7  51.1  45.9   Platelets 150 - 450 x10E3/uL 203  220  169        Latest Ref Rng &  Units 06/02/2022    7:58 AM 02/25/2021    8:44 AM 09/02/2019    8:27 AM  BMP  Glucose 70 - 99 mg/dL 98  99  97   BUN 8 - 27 mg/dL 17  17  16    Creatinine 0.76 - 1.27 mg/dL 7.56  4.33  2.95   BUN/Creat Ratio 10 - 24 15  13  11    Sodium 134 - 144 mmol/L 141  143  142   Potassium 3.5 - 5.2 mmol/L 4.1  4.7  4.5   Chloride 96 - 106 mmol/L 105  101  103   CO2 20 - 29 mmol/L 20  21  24    Calcium 8.6 - 10.2 mg/dL 9.4  18.8  9.6       Latest Ref Rng & Units 06/02/2022    7:58 AM 02/25/2021    8:44 AM 09/02/2019    8:27 AM  CMP  Glucose 70 - 99 mg/dL 98  99  97   BUN 8 - 27 mg/dL 17  17  16    Creatinine 0.76 - 1.27 mg/dL 4.16  6.06  3.01   Sodium 134 - 144 mmol/L 141  143  142   Potassium 3.5 - 5.2 mmol/L 4.1  4.7  4.5   Chloride 96 - 106 mmol/L 105  101  103   CO2 20 - 29 mmol/L 20  21  24    Calcium 8.6 - 10.2 mg/dL 9.4  60.1  9.6   Total Protein 6.0 - 8.5 g/dL  7.4    Total Bilirubin 0.0 - 1.2 mg/dL  0.7    Alkaline Phos 44 - 121 IU/L  78    AST 0 - 40 IU/L  30    ALT 0 - 44 IU/L  30      Lab Results  Component Value Date   CHOL 74 (L) 06/02/2022   HDL 33 (L) 06/02/2022   LDLCALC 25 06/02/2022   TRIG 73 06/02/2022   CHOLHDL 2.6 02/25/2021   No results for input(s): "LIPOA" in the last 8760 hours. No components found for: "NTPROBNP" No results for input(s): "PROBNP" in the last 8760 hours. No results for input(s): "TSH" in the last 8760 hours.   Physical Exam:    BP 118/66 (BP Location: Left Arm, Patient Position: Sitting, Cuff Size: Normal)   Pulse 74   Resp 16   Ht 5\' 6"  (1.676 m)   Wt 177 lb (80.3 kg)   SpO2 94%   BMI 28.57 kg/m   Orthostatic VS for the past 72 hrs (Last 3 readings):  Orthostatic BP Patient Position BP Location Cuff Size Orthostatic Pulse  08/23/23 1618 116/64 Standing Right Arm Normal 77  08/23/23 1617 120/66 Sitting Right Arm Normal 73  08/23/23 1616 108/58 Supine Right Arm Normal 71    Body mass index is 28.57 kg/m. Wt Readings from Last 3  Encounters:  08/23/23 177 lb (80.3 kg)  02/02/23 183 lb (83 kg)  12/08/22 186 lb (84.4 kg)    Physical Exam  Constitutional:  Age appropriate, hemodynamically stable, no acute distress.   Neck: No JVD present.  Cardiovascular: Normal rate, regular rhythm, S1 normal and S2 normal. Exam reveals no gallop and no friction rub.  No murmur heard. Pulmonary/Chest: Breath sounds normal. He has no wheezes. He has no rales. He exhibits no tenderness.  Abdominal: Soft. Bowel sounds are normal. He exhibits no distension. There is no abdominal tenderness.  Musculoskeletal:        General: No tenderness or edema.  Neurological: He is alert and oriented to person, place, and time.  Skin: Skin is warm and dry.    Impression & Recommendation(s):  Impression:   ICD-10-CM   1. Syncope and collapse  R55 EKG 12-Lead    ECHOCARDIOGRAM COMPLETE    LONG TERM MONITOR (3-14 DAYS)    2. Orthostatic hypotension  I95.1     3. Coronary artery disease of native artery of native heart with stable angina pectoris (HCC)  I25.118 isosorbide mononitrate (IMDUR) 30 MG 24 hr tablet    nitroGLYCERIN (NITROSTAT) 0.4 MG SL tablet    4. Coronary artery disease involving native coronary artery of native heart without angina pectoris  I25.10     5. Hx of CABG  Z95.1     6. Essential hypertension  I10 losartan (COZAAR) 100 MG tablet    metoprolol succinate (TOPROL-XL) 100 MG 24 hr tablet       Recommendation(s):  Syncope and collapse Orthostatic hypotension His recent syncopal event likely secondary to vasovagal physiology. With the recent loss of weight recommend down titration of blood pressure medications. In addition, he takes all of his blood pressure pills in the morning which may have also contributed to the episode Discontinue amlodipine 5 mg p.o. daily. Reduce Imdur from 30 mg p.o. daily to 15 mg p.o. every morning Recommended taking losartan 100 mg p.o. every afternoon Continue Toprol-XL 100 mg p.o.  every morning Orthostatic vital signs negative. EKG today nonischemic. 2 weeks Zio patch to evaluate for dysrhythmias-given his complex cardiovascular history would like to rule out dysrhythmias which may have been contributory. Last echo and stress test were in 2022. Echo will be ordered to evaluate for structural heart disease and left ventricular systolic function. Will hold off on stress test at this time as the overall probability of ischemic substrate is low will defer final evaluation to primary cardiology at follow-up visit  Coronary artery disease involving native coronary artery of native heart without angina pectoris Hx of CABG No anginal chest pain. EKG is nonischemic. Refilled sublingual nitroglycerin tablet. Reemphasized the importance of secondary prevention with focus on improving her modifiable cardiovascular risk factors such as glycemic control, lipid management, blood pressure control, weight loss.  Essential hypertension Office blood pressures are very well-controlled. Medication changes as discussed above.   Orders Placed:  Orders Placed This Encounter  Procedures   LONG TERM MONITOR (3-14 DAYS)    Standing Status:   Future    Standing Expiration Date:   08/22/2024    Order Specific Question:   Where should this test be performed?    Answer:   [facility]    Order Specific Question:   Does the patient have an implanted cardiac device?    Answer:   No    Order Specific Question:   Prescribed days of wear    Answer:   28    Order Specific Question:   Type of enrollment  Answer:   Home Enrollment    Order Specific Question:   Vendor:    Answer:   [employer]   EKG 12-Lead   ECHOCARDIOGRAM COMPLETE    Standing Status:   Future    Standing Expiration Date:   08/22/2024    Order Specific Question:   Where should this test be performed    Answer:   [facility])    Order Specific Question:   Does the patient weigh less than or greater than  250 lbs?    Answer:   Patient weighs less than 250 lbs    Order Specific Question:   Perflutren DEFINITY (image enhancing agent) should be administered unless hypersensitivity or allergy exist    Answer:   Administer Perflutren    Order Specific Question:   Reason for exam-Echo    Answer:   Syncope R55    As part of medical decision making prior progress note from Dr. Truett Mainland April 2024, EKG, orthostatic vital signs were independently reviewed.  Final Medication List:    Meds ordered this encounter  Medications   losartan (COZAAR) 100 MG tablet    Sig: Take 1 tablet (100 mg total) by mouth every evening.   metoprolol succinate (TOPROL-XL) 100 MG 24 hr tablet    Sig: Take 1 tablet (100 mg total) by mouth in the morning. TAKE 1 TABLET BY MOUTH EVERY DAY WITH OR IMMEDIATELY FOLLOWING A MEAL   isosorbide mononitrate (IMDUR) 30 MG 24 hr tablet    Sig: Take 0.5 tablets (15 mg total) by mouth in the morning.   nitroGLYCERIN (NITROSTAT) 0.4 MG SL tablet    Sig: Place 1 tablet (0.4 mg total) under the tongue every 5 (five) minutes as needed for chest pain.    Dispense:  25 tablet    Refill:  6    Medications Discontinued During This Encounter  Medication Reason   amoxicillin-clavulanate (AUGMENTIN) 875-125 MG tablet    predniSONE (DELTASONE) 20 MG tablet    amLODipine (NORVASC) 5 MG tablet Discontinued by provider   nitroGLYCERIN (NITROSTAT) 0.4 MG SL tablet Reorder   isosorbide mononitrate (IMDUR) 30 MG 24 hr tablet    losartan (COZAAR) 100 MG tablet    metoprolol succinate (TOPROL-XL) 100 MG 24 hr tablet      Current Outpatient Medications:    albuterol (VENTOLIN HFA) 108 (90 Base) MCG/ACT inhaler, Inhale 2 puffs into the lungs every 6 (six) hours as needed for wheezing or shortness of breath (Cough)., Disp: 18 g, Rfl: 0   aspirin EC 81 MG tablet, Take 81 mg by mouth daily., Disp: , Rfl:    atorvastatin (LIPITOR) 40 MG tablet, TAKE 1 TABLET BY MOUTH EVERY DAY, Disp: 30  tablet, Rfl: 5   fluticasone (FLONASE) 50 MCG/ACT nasal spray, Place 1 spray into both nostrils daily. Begin by using 2 sprays in each nare daily for 3 to 5 days, then decrease to 1 spray in each nare daily., Disp: 48 mL, Rfl: 1   guaifenesin (HUMIBID E) 400 MG TABS tablet, Take 1 tablet 3 times daily as needed for chest congestion and cough, Disp: 21 tablet, Rfl: 0   omeprazole (PRILOSEC) 10 MG capsule, Take 10 mg by mouth daily., Disp: , Rfl:    cetirizine (ZYRTEC ALLERGY) 10 MG tablet, Take 1 tablet (10 mg total) by mouth at bedtime., Disp: 90 tablet, Rfl: 1   fluticasone-salmeterol (ADVAIR HFA) 230-21 MCG/ACT inhaler, Inhale 2 puffs into the lungs 2 (two) times daily., Disp:  360 each, Rfl: 0   isosorbide mononitrate (IMDUR) 30 MG 24 hr tablet, Take 0.5 tablets (15 mg total) by mouth in the morning., Disp: , Rfl:    losartan (COZAAR) 100 MG tablet, Take 1 tablet (100 mg total) by mouth every evening., Disp: , Rfl:    metoprolol succinate (TOPROL-XL) 100 MG 24 hr tablet, Take 1 tablet (100 mg total) by mouth in the morning. TAKE 1 TABLET BY MOUTH EVERY DAY WITH OR IMMEDIATELY FOLLOWING A MEAL, Disp: , Rfl:    nitroGLYCERIN (NITROSTAT) 0.4 MG SL tablet, Place 1 tablet (0.4 mg total) under the tongue every 5 (five) minutes as needed for chest pain., Disp: 25 tablet, Rfl: 6  Consent:   NA  Disposition:   Follow-up in 6 weeks or sooner if needed with Dr. Truett Mainland  Patient may be asked to follow-up sooner based on the results of the above-mentioned testing.  His questions and concerns were addressed to his satisfaction. He voices understanding of the recommendations provided during this encounter.    Signed, Tessa Lerner, DO, [facility]  [facility]  [address] [place], [place] [address] 08/23/2023 5:02 PM

## 2023-08-23 NOTE — Patient Instructions (Signed)
Medication Instructions:  Your physician has recommended you make the following change in your medication:   1) STOP amlodipine 2) DECREASE isosorbide mononitrate (Imdur) to 15 mg daily in the morning 3) TAKE metoprolol succinate (Toprol XL) daily in the morning 4) TAKE losartan daily in the evening  *If you need a refill on your cardiac medications before your next appointment, please call your pharmacy*  Lab Work: None ordered today.  Testing/Procedures: Your physician has requested that you have an echocardiogram. Echocardiography is a painless test that uses sound waves to create images of your heart. It provides your doctor with information about the size and shape of your heart and how well your heart's chambers and valves are working. This procedure takes approximately one hour. There are no restrictions for this procedure. Please do NOT wear cologne, perfume, aftershave, or lotions (deodorant is allowed). Please arrive 15 minutes prior to your appointment time.  Your physician has requested that you wear a Zio heart monitor for 14 days. This will be mailed to your home with instructions on how to apply the monitor and how to return it when finished. Please allow 2 weeks after returning the heart monitor before our office calls you with the results.   Follow-Up: At [facility], you and your health needs are our priority.  As part of our continuing mission to provide you with exceptional heart care, we have created designated Provider Care Teams.  These Care Teams include your primary Cardiologist (physician) and Advanced Practice Providers (APPs -  Physician Assistants and Nurse Practitioners) who all work together to provide you with the care you need, when you need it.  Your next appointment:   6 week(s)  The format for your next appointment:   In Person  Provider:   Elder Negus, MD {  Other Instructions ZIO XT- Long Term Monitor Instructions     Your physician  has requested you wear a ZIO patch monitor for 14 days.  This is a single patch monitor. [employer] supplies one patch monitor per enrollment. Additional  stickers are not available. Please do not apply patch if you will be having a Nuclear Stress Test,  Echocardiogram, Cardiac CT, MRI, or Chest Xray during the period you would be wearing the  monitor. The patch cannot be worn during these tests. You cannot remove and re-apply the  ZIO XT patch monitor.  Your ZIO patch monitor will be mailed 3 day USPS to your address on file. It may take 3-5 days  to receive your monitor after you have been enrolled.  Once you have received your monitor, please review the enclosed instructions. Your monitor  has already been registered assigning a specific monitor serial # to you.     Billing and Patient Assistance Program Information     We have supplied [employer] with any of your insurance information on file for billing purposes.  [employer] offers a sliding scale Patient Assistance Program for patients that do not have  insurance, or whose insurance does not completely cover the cost of the ZIO monitor.  You must apply for the Patient Assistance Program to qualify for this discounted rate.  To apply, please call [employer] at [phone], select option 4, select option 2, ask to apply for  Patient Assistance Program. Meredeth Ide will ask your household income, and how many people  are in your household. They will quote your out-of-pocket cost based on that information.  [employer] will also be able to set up a 62-month, interest-free  payment plan if needed.     Applying the monitor     Shave hair from upper left chest.  Hold abrader disc by orange tab. Rub abrader in 40 strokes over the upper left chest as  indicated in your monitor instructions.  Clean area with 4 enclosed alcohol pads. Let dry.  Apply patch as indicated in monitor instructions. Patch will be placed under collarbone on left  side of chest with  arrow pointing upward.  Rub patch adhesive wings for 2 minutes. Remove white label marked "1". Remove the white  label marked "2". Rub patch adhesive wings for 2 additional minutes.  While looking in a mirror, press and release button in center of patch. A small green light will  flash 3-4 times. This will be your only indicator that the monitor has been turned on.  Do not shower for the first 24 hours. You may shower after the first 24 hours.  Press the button if you feel a symptom. You will hear a small click. Record Date, Time and  Symptom in the Patient Logbook.  When you are ready to remove the patch, follow instructions on the last 2 pages of Patient  Logbook. Stick patch monitor onto the last page of Patient Logbook.  Place Patient Logbook in the blue and white box. Use locking tab on box and tape box closed  securely. The blue and white box has prepaid postage on it. Please place it in the mailbox as  soon as possible. Your physician should have your test results approximately 7 days after the  monitor has been mailed back to [facility].  Call [facility] Customer Care at [phone] if you have questions regarding  your ZIO XT patch monitor. Call them immediately if you see an orange light blinking on your  monitor.  If your monitor falls off in less than 4 days, contact our Monitor department at [phone].  If your monitor becomes loose or falls off after 4 days call [employer] at [phone] for  suggestions on securing your monitor.

## 2023-09-10 ENCOUNTER — Encounter (HOSPITAL_COMMUNITY): Payer: Self-pay | Admitting: Radiology

## 2023-09-10 ENCOUNTER — Ambulatory Visit (HOSPITAL_COMMUNITY): Payer: 59 | Attending: Cardiology

## 2023-09-10 DIAGNOSIS — R55 Syncope and collapse: Secondary | ICD-10-CM

## 2023-09-10 LAB — ECHOCARDIOGRAM COMPLETE
Area-P 1/2: 2.22 cm2
P 1/2 time: 138 ms
S' Lateral: 2.7 cm

## 2023-09-10 NOTE — Progress Notes (Signed)
Patient arrived this morning for echocardiogram. Blood pressure was noted to be 160/96. Patient was concerned about increased blood pressure due to reduction in blood pressure medication.

## 2023-09-13 ENCOUNTER — Other Ambulatory Visit: Payer: Self-pay

## 2023-09-13 MED ORDER — AMLODIPINE BESYLATE 5 MG PO TABS: 2.5000 mg | ORAL_TABLET | Freq: Every day | ORAL | Status: DC

## 2023-09-13 NOTE — Progress Notes (Signed)
Amlodipine 5 mg was discontinued on 08/23/2023 due to an episode of syncope.  Okay to resume amlodipine at half the dose-2.5 mg daily. If SBP remains >140 mmHg, can increase to 5 mg daily. He has appointment with me on 10/05/2023 where I will further address this.  Thanks MJP    Thanks MJP

## 2023-09-13 NOTE — Progress Notes (Signed)
Manish,  You follow up - could you address it.   I saw him once on DOD for sick visit - due to LOC. See note.   Marthe Dant [place], DO, [facility]

## 2023-09-13 NOTE — Progress Notes (Signed)
Hypertension Spoke with Shawn Chang and advised per Dr Rosemary Holms restart Amlodipine 5mg  - 1/2 tablet by mouth by mouth daily and if BP remains >140 systolic Shawn Chang should increase Amlodipine 5mg  - 1 tablet by mouth daily.  Shawn Chang should continue to monitor BP and plan to keep 10/05/2023 appointment with Dr Rosemary Holms.  Shawn Chang verbalizes understanding and agrees with current plan.

## 2023-10-05 ENCOUNTER — Ambulatory Visit: Payer: 59 | Attending: Cardiology | Admitting: Cardiology

## 2023-10-05 ENCOUNTER — Encounter: Payer: Self-pay | Admitting: Cardiology

## 2023-10-05 VITALS — BP 120/82 | HR 69 | Resp 16 | Ht 66.0 in | Wt 176.0 lb

## 2023-10-05 DIAGNOSIS — Z79899 Other long term (current) drug therapy: Secondary | ICD-10-CM | POA: Diagnosis not present

## 2023-10-05 DIAGNOSIS — E785 Hyperlipidemia, unspecified: Secondary | ICD-10-CM | POA: Insufficient documentation

## 2023-10-05 DIAGNOSIS — J4540 Moderate persistent asthma, uncomplicated: Secondary | ICD-10-CM

## 2023-10-05 DIAGNOSIS — I2510 Atherosclerotic heart disease of native coronary artery without angina pectoris: Secondary | ICD-10-CM

## 2023-10-05 DIAGNOSIS — E782 Mixed hyperlipidemia: Secondary | ICD-10-CM

## 2023-10-05 MED ORDER — FLUTICASONE-SALMETEROL 230-21 MCG/ACT IN AERO: 2.0000 | INHALATION_SPRAY | Freq: Two times a day (BID) | RESPIRATORY_TRACT | 0 refills | Status: DC

## 2023-10-05 NOTE — Progress Notes (Signed)
Cardiology Office Note:  .   Date:  10/05/2023  ID:  Shawn Chang, DOB [birth year], MRN [id number] PCP: Patient, No Pcp Per  [facility] Providers Cardiologist:  Truett Mainland, MD PCP: Patient, No Pcp Per  Chief Complaint  Patient presents with   Coronary artery disease of native artery of native heart wi   Follow-up      History of Present Illness: .    Shawn Chang is a 62 y.o. male with hypertension, coronary artery disease status post CABG in 2016 (LIMA-LAD, Lt radial-OM1, SVG-OM2,OM3, SVG-PDA), prior unsuccessful PTCA to RCA, CKD 3   Patient was seen by Dr. Odis Hollingshead on 08/23/2023 with complaint of syncope.  Episode was thought to be due to orthostatic hypotension or vasovagal etiology.  His amlodipine 5 mg was discontinued, and Imdur was reduced from 30 mg daily to 50 mg daily.  Echocardiogram was obtained, details below.  Subsequently, patient had called stating his blood pressure was elevated.  At this time, amlodipine was resumed at a lower dose of 2.5 mg daily on 09/10/2023.  Patient has not had any recurrent syncopal or presyncopal episodes.  As amlodipine 5 mg daily pill was too small to cut in half, he is back to taking amlodipine 5 mg daily.  With that, blood pressure staying well-controlled.  He denies any chest pain, shortness of breath symptoms.  On a separate note, he was diagnosed with asthma in 2017.  He has not seen pulmonology since then.  While he has as needed albuterol inhaler, he has ran out of Advair.   Vitals:   10/05/23 1139  BP: 120/82  Pulse: 69  Resp: 16  SpO2: 94%     ROS:  Review of Systems  Cardiovascular:  Negative for chest pain, dyspnea on exertion, leg swelling, palpitations and syncope.     Studies Reviewed: Marland Kitchen        No new studies reviewed.  Physical Exam:   Physical Exam Vitals and nursing note reviewed.  Constitutional:      General: He is not in acute distress. Neck:     Vascular: No JVD.  Cardiovascular:      Rate and Rhythm: Normal rate and regular rhythm.     Heart sounds: Normal heart sounds. No murmur heard. Pulmonary:     Effort: Pulmonary effort is normal.     Breath sounds: Normal breath sounds. No wheezing or rales.  Musculoskeletal:     Right lower leg: No edema.     Left lower leg: No edema.      VISIT DIAGNOSES:   ICD-10-CM   1. Asthma, unspecified asthma severity, unspecified whether complicated, unspecified whether persistent  J45.909 Ambulatory referral to Pulmonology    2. Hyperlipidemia, unspecified hyperlipidemia type  E78.5 Lipid panel    3. Coronary artery disease of native artery of native heart with stable angina pectoris (HCC)  I25.118 CBC w/Diff    Basic Metabolic Panel (BMET)    4. Medication management  Z79.899 CBC w/Diff    Basic Metabolic Panel (BMET)    Lipid panel       ASSESSMENT AND PLAN: .    Shawn Chang is a 62 y.o. male with hypertension, coronary artery disease status post CABG in 2016 (LIMA-LAD, Lt radial-OM1, SVG-OM2,OM3, SVG-PDA), prior unsuccessful PTCA to RCA, CKD 3   CAD: No specific angina or angina equivalent symptoms at this time. Continue medical treatment. If he has any recurrent exertional dyspnea, I would recommend coronary bypass graft angiography  for further evaluation, given that he has had a mildly abnormal stress test in 2022. Continue aspirin, statin, current antianginal therapy. I congratulated him on weight loss. Check CBC, BMP, lipid panel today.  Hypertension: Well controlled. No changes made today.  Asthma: Diagnosed in 2017 when he was seen by Dr. Isaiah Serge He is currently out of Advair. I refilled Advair. I also referred him back to pulmonology to reestablish care.   Meds ordered this encounter  Medications   fluticasone-salmeterol (ADVAIR HFA) 230-21 MCG/ACT inhaler    Sig: Inhale 2 puffs into the lungs 2 (two) times daily.    Dispense:  1 each    Refill:  0     F/u in 6 months  Signed, Elder Negus, MD

## 2023-10-05 NOTE — Patient Instructions (Signed)
Medication Instructions:  Your physician recommends that you continue on your current medications as directed. Please refer to the Current Medication list given to you today.  *If you need a refill on your cardiac medications before your next appointment, please call your pharmacy*   Lab Work: Please complete a CBC, BMP and a FASTING lipid panel in our lab before you leave today.   If you have labs (blood work) drawn today and your tests are completely normal, you will receive your results only by: MyChart Message (if you have MyChart) OR A paper copy in the mail If you have any lab test that is abnormal or we need to change your treatment, we will call you to review the results.   Testing/Procedures: None.   Follow-Up:  Your next appointment:   6 month(s)  Provider:   Elder Negus, MD     Other Instructions Dr. Rosemary Holms has referred you to see Dr. Isaiah Serge at [facility]. Someone from their office will call you to set up an appointment.

## 2023-10-06 LAB — LIPID PANEL
Chol/HDL Ratio: 2.4 {ratio} (ref 0.0–5.0)
Cholesterol, Total: 78 mg/dL — ABNORMAL LOW (ref 100–199)
HDL: 32 mg/dL — ABNORMAL LOW (ref >39)
LDL Chol Calc (NIH): 28 mg/dL (ref 0–99)
Triglycerides: 87 mg/dL (ref 0–149)
VLDL Cholesterol Cal: 18 mg/dL (ref 5–40)

## 2023-10-06 LAB — CBC WITH DIFFERENTIAL/PLATELET
Basophils Absolute: 0.1 10*3/uL (ref 0.0–0.2)
Basos: 1 %
EOS (ABSOLUTE): 0.5 10*3/uL — ABNORMAL HIGH (ref 0.0–0.4)
Eos: 5 %
Hematocrit: 49.9 % (ref 37.5–51.0)
Hemoglobin: 16.2 g/dL (ref 13.0–17.7)
Immature Grans (Abs): 0 10*3/uL (ref 0.0–0.1)
Immature Granulocytes: 0 %
Lymphocytes Absolute: 2.3 10*3/uL (ref 0.7–3.1)
Lymphs: 24 %
MCH: 29.5 pg (ref 26.6–33.0)
MCHC: 32.5 g/dL (ref 31.5–35.7)
MCV: 91 fL (ref 79–97)
Monocytes Absolute: 1 10*3/uL — ABNORMAL HIGH (ref 0.1–0.9)
Monocytes: 10 %
Neutrophils Absolute: 5.8 10*3/uL (ref 1.4–7.0)
Neutrophils: 60 %
Platelets: 227 10*3/uL (ref 150–450)
RBC: 5.49 x10E6/uL (ref 4.14–5.80)
RDW: 12.3 % (ref 11.6–15.4)
WBC: 9.7 10*3/uL (ref 3.4–10.8)

## 2023-10-06 LAB — BASIC METABOLIC PANEL
BUN/Creatinine Ratio: 15 (ref 10–24)
BUN: 19 mg/dL (ref 8–27)
CO2: 25 mmol/L (ref 20–29)
Calcium: 10.1 mg/dL (ref 8.6–10.2)
Chloride: 105 mmol/L (ref 96–106)
Creatinine, Ser: 1.25 mg/dL (ref 0.76–1.27)
Glucose: 85 mg/dL (ref 70–99)
Potassium: 4.8 mmol/L (ref 3.5–5.2)
Sodium: 144 mmol/L (ref 134–144)
eGFR: 65 mL/min/{1.73_m2} (ref >59)

## 2023-11-15 ENCOUNTER — Other Ambulatory Visit: Payer: Self-pay | Admitting: Cardiology

## 2023-12-08 ENCOUNTER — Ambulatory Visit
Admission: EM | Admit: 2023-12-08 | Discharge: 2023-12-08 | Disposition: A | Discharge status: Home / Self Care | Payer: 59 | Attending: Family Medicine | Admitting: Family Medicine

## 2023-12-08 DIAGNOSIS — J4541 Moderate persistent asthma with (acute) exacerbation: Secondary | ICD-10-CM

## 2023-12-08 MED ORDER — PREDNISONE 20 MG PO TABS: 40.0000 mg | ORAL_TABLET | Freq: Every day | ORAL | 0 refills | Status: AC

## 2023-12-08 MED ORDER — IPRATROPIUM-ALBUTEROL 0.5-2.5 (3) MG/3ML IN SOLN
3.0000 mL | Freq: Once | RESPIRATORY_TRACT | Status: AC
  Administered 2023-12-08: 3 mL via RESPIRATORY_TRACT

## 2023-12-08 MED ORDER — FLUTICASONE-SALMETEROL 230-21 MCG/ACT IN AERO: 2.0000 | INHALATION_SPRAY | Freq: Two times a day (BID) | RESPIRATORY_TRACT | 0 refills | Status: DC

## 2023-12-08 NOTE — ED Triage Notes (Signed)
 Pt present with c/o persistent wheezing x months. Pt states he has not been able to sleep and feels worse. Pt states he has still been coughing. He is out of Advair 

## 2023-12-08 NOTE — ED Provider Notes (Signed)
 [facility]    CSN: [id number] Arrival date & time: 12/08/23  1454      History   Chief Complaint Chief Complaint  Patient presents with   Wheezing   Medication Refill    HPI Shawn Chang is a 63 y.o. male reports for asthma flare.  Patient has a history of asthma and has been out of his Advair  inhaler for about 3 to 4 months.  Shawn Chang does have an albuterol  inhaler which she uses but it is no longer controlling his wheezing.  Shawn Chang states this has persisted and is worse at night.  Shawn Chang denies any URI/cough cold symptoms.  No fevers.  No other concerns at this time.   Wheezing Medication Refill   Past Medical History:  Diagnosis Date   Acid reflux    Asthma    Coronary artery disease    Hyperlipidemia    Hypertension    Myocardial infarction [facility])    Retinal detachment     Patient Active Problem List   Diagnosis Date Noted   Medication management 10/05/2023   Hyperlipidemia 10/05/2023   Moderate persistent asthma without complication 10/05/2023   Snoring 02/02/2023   Chronic fatigue 02/02/2023   Exercise-induced asthma 02/10/2021   Orthopnea 02/10/2021   Wheezing 03/15/2020   Coronary artery disease of native artery of native heart with stable angina pectoris (HCC) 10/08/2015   Abnormal nuclear stress test 09/24/2015   Coronary atherosclerosis due to calcified coronary lesion 09/24/2015   Angina pectoris (HCC) 09/09/2015   Essential hypertension 02/15/2015    Past Surgical History:  Procedure Laterality Date   CARDIAC CATHETERIZATION N/A 10/01/2015   Procedure: Left Heart Cath and Coronary Angiography;  Surgeon: Gordy Bergamo, MD;  Location: [facility] INVASIVE CV LAB;  Service: Cardiovascular;  Laterality: N/A;   CARDIAC CATHETERIZATION N/A 10/08/2015   Procedure: Coronary Stent Intervention;  Surgeon: Gordy Bergamo, MD;  Location: [facility] INVASIVE CV LAB;  Service: Cardiovascular;  Laterality: N/A;   CORONARY ARTERY BYPASS GRAFT N/A 10/12/2015   Procedure: CORONARY ARTERY  BYPASS GRAFTING (CABG) x five , using left internal mammary artery and bilateral legs greater saphenous vein harvested endoscopically;  Surgeon: Maude Fleeta Ochoa, MD;  Location: [facility] OR;  Service: Open Heart Surgery;  Laterality: N/A;   CORONARY ARTERY BYPASS GRAFT     KNEE SURGERY     PERIPHERAL VASCULAR CATHETERIZATION  10/08/2015   Procedure: Abdominal Aortogram;  Surgeon: Gordy Bergamo, MD;  Location: [facility] INVASIVE CV LAB;  Service: Cardiovascular;;   PERIPHERAL VASCULAR CATHETERIZATION  10/08/2015   Procedure: Aortic Arch Angiography;  Surgeon: Gordy Bergamo, MD;  Location: [facility] INVASIVE CV LAB;  Service: Cardiovascular;;   shoulder surgery     TEE WITHOUT CARDIOVERSION N/A 10/12/2015   Procedure: TRANSESOPHAGEAL ECHOCARDIOGRAM (TEE);  Surgeon: Maude Fleeta Ochoa, MD;  Location: [facility] OR;  Service: Open Heart Surgery;  Laterality: N/A;   TONSILLECTOMY     Pt has also had sinus and throat surgery       Home Medications    Prior to Admission medications   Medication Sig Start Date End Date Taking? Authorizing Provider  predniSONE  (DELTASONE ) 20 MG tablet Take 2 tablets (40 mg total) by mouth daily with breakfast for 5 days. 12/08/23 12/13/23 Yes Seanpatrick Maisano, Jodi R, NP  albuterol  (VENTOLIN  HFA) 108 (90 Base) MCG/ACT inhaler Inhale 2 puffs into the lungs every 6 (six) hours as needed for wheezing or shortness of breath (Cough). 06/30/23   Christopher Savannah, PA-C  amLODipine  (NORVASC ) 5 MG tablet Take 0.5  tablets (2.5 mg total) by mouth daily. Patient taking differently: Take 5 mg by mouth daily. 09/13/23   Patwardhan, Newman PARAS, MD  aspirin  EC 81 MG tablet Take 81 mg by mouth daily.    [provider]  atorvastatin  (LIPITOR ) 40 MG tablet TAKE 1 TABLET BY MOUTH EVERY DAY 11/15/23   Patwardhan, Manish J, MD  cetirizine  (ZYRTEC  ALLERGY ) 10 MG tablet Take 1 tablet (10 mg total) by mouth at bedtime. 01/30/22 10/05/23  Joesph Shaver Scales, PA-C  fluticasone  (FLONASE ) 50 MCG/ACT nasal spray Place 1 spray into both nostrils  daily. Begin by using 2 sprays in each nare daily for 3 to 5 days, then decrease to 1 spray in each nare daily. 01/30/22   Joesph Shaver Scales, PA-C  fluticasone -salmeterol (ADVAIR  HFA) 230-21 MCG/ACT inhaler Inhale 2 puffs into the lungs 2 (two) times daily. 12/08/23 03/07/24  Naethan Bracewell, Jodi R, NP  guaifenesin  (HUMIBID E) 400 MG TABS tablet Take 1 tablet 3 times daily as needed for chest congestion and cough 01/30/22   Joesph Shaver Scales, PA-C  isosorbide  mononitrate (IMDUR ) 30 MG 24 hr tablet Take 0.5 tablets (15 mg total) by mouth in the morning. 08/23/23   Tolia, Sunit, DO  losartan  (COZAAR ) 100 MG tablet Take 1 tablet (100 mg total) by mouth every evening. 08/23/23   Tolia, Sunit, DO  metoprolol  succinate (TOPROL -XL) 100 MG 24 hr tablet Take 1 tablet (100 mg total) by mouth in the morning. TAKE 1 TABLET BY MOUTH EVERY DAY WITH OR IMMEDIATELY FOLLOWING A MEAL 08/23/23   Tolia, Sunit, DO  nitroGLYCERIN  (NITROSTAT ) 0.4 MG SL tablet Place 1 tablet (0.4 mg total) under the tongue every 5 (five) minutes as needed for chest pain. 08/23/23   Tolia, Sunit, DO  omeprazole (PRILOSEC) 10 MG capsule Take 10 mg by mouth daily.    [provider]    Family History Family History  Problem Relation Age of Onset   Heart disease Mother        May 2017   Heart disease Father    Hypertension Sister    Stroke Sister    Heart attack Brother        massive, survived   Heart attack Brother     Social History Social History   Tobacco Use   Smoking status: Former    Current packs/day: 0.00    Average packs/day: 1.5 packs/day for 39.0 years (58.5 ttl pk-yrs)    Types: Cigarettes    Start date: 08/30/1976    Quit date: 08/31/2015    Years since quitting: 8.2   Smokeless tobacco: Never  Vaping Use   Vaping status: Never Used  Substance Use Topics   Alcohol use: Yes    Alcohol/week: 0.0 standard drinks of alcohol    Comment: 1-3 beers per week   Drug use: No     Allergies   Other, Morphine ,  and Morphine  and codeine   Review of Systems Review of Systems  Respiratory:  Positive for wheezing.      Physical Exam Triage Vital Signs ED Triage Vitals  Encounter Vitals Group     BP 12/08/23 1526 124/80     Systolic BP Percentile --      Diastolic BP Percentile --      Pulse Rate 12/08/23 1526 89     Resp 12/08/23 1526 17     Temp 12/08/23 1526 98 F (36.7 C)     Temp Source 12/08/23 1526 Oral     SpO2 12/08/23 1526  91 %     Weight --      Height --      Head Circumference --      Peak Flow --      Pain Score 12/08/23 1525 0     Pain Loc --      Pain Education --      Exclude from Growth Chart --    No data found.  Updated Vital Signs BP 124/80 (BP Location: Left Arm)   Pulse 89   Temp 98 F (36.7 C) (Oral)   Resp 17   SpO2 92% Comment: after breathing treatment  Visual Acuity Right Eye Distance:   Left Eye Distance:   Bilateral Distance:    Right Eye Near:   Left Eye Near:    Bilateral Near:     Physical Exam Vitals and nursing note reviewed.  Constitutional:      General: Shawn Chang is not in acute distress.    Appearance: Normal appearance. Shawn Chang is not ill-appearing.  HENT:     Head: Normocephalic and atraumatic.  Eyes:     Pupils: Pupils are equal, round, and reactive to light.  Cardiovascular:     Rate and Rhythm: Normal rate and regular rhythm.     Heart sounds: Normal heart sounds.  Pulmonary:     Effort: Pulmonary effort is normal.     Breath sounds: Wheezing present. No rhonchi.  Skin:    General: Skin is warm and dry.  Neurological:     General: No focal deficit present.     Mental Status: Shawn Chang is alert and oriented to person, place, and time.  Psychiatric:        Mood and Affect: Mood normal.        Behavior: Behavior normal.      UC Treatments / Results  Labs (all labs ordered are listed, but only abnormal results are displayed) Labs Reviewed - No data to display  EKG   Radiology No results found.  Procedures Procedures  (including critical care time)  Medications Ordered in UC Medications  ipratropium-albuterol  (DUONEB) 0.5-2.5 (3) MG/3ML nebulizer solution 3 mL (3 mLs Nebulization Given 12/08/23 1602)    Initial Impression / Assessment and Plan / UC Course  I have reviewed the triage vital signs and the nursing notes.  Pertinent labs & imaging results that were available during my care of the patient were reviewed by me and considered in my medical decision making (see chart for details).     Reviewed exam and symptoms with patient.  Patient with asthma exacerbation symptoms.  Nebulizer in clinic with improvement in wheezing patient reports improvement in symptoms.  Refilled Advair  and will start prednisone  daily for 5 days.  Advised to follow-up with his PCP in 2 to 3 days for recheck.  States has a pulmonology appointment in March and will follow-up then.  Strict ER precautions reviewed and patient verbalized understanding. Final Clinical Impressions(s) / UC Diagnoses   Final diagnoses:  Moderate persistent asthma with acute exacerbation     Discharge Instructions      I refilled your Advair  and please start prednisone  daily for 5 days.  Please follow-up with your PCP or pulmonologist in 2 to 3 days for recheck.  Please go to the ER for any worsening symptoms.  Hope you feel better soon!     ED Prescriptions     Medication Sig Dispense Auth. Provider   fluticasone -salmeterol (ADVAIR  HFA) 230-21 MCG/ACT inhaler Inhale 2 puffs into the lungs 2 (  two) times daily. 360 each Caylei Sperry, Jodi R, NP   predniSONE  (DELTASONE ) 20 MG tablet Take 2 tablets (40 mg total) by mouth daily with breakfast for 5 days. 10 tablet Coila Wardell, Jodi R, NP      PDMP not reviewed this encounter.   Loreda Myla SAUNDERS, NP 12/08/23 1620

## 2023-12-08 NOTE — Discharge Instructions (Addendum)
 I refilled your Advair  and please start prednisone  daily for 5 days.  Please follow-up with your PCP or pulmonologist in 2 to 3 days for recheck.  Please go to the ER for any worsening symptoms.  Hope you feel better soon!

## 2023-12-09 ENCOUNTER — Telehealth: Payer: Self-pay

## 2023-12-09 MED ORDER — ADVAIR HFA 230-21 MCG/ACT IN AERO: 2.0000 | INHALATION_SPRAY | Freq: Two times a day (BID) | RESPIRATORY_TRACT | 12 refills | Status: DC

## 2023-12-09 NOTE — Telephone Encounter (Signed)
 Pt walked ni to let us  know about insurance not covering the inhaler sent in by provider

## 2024-01-12 ENCOUNTER — Other Ambulatory Visit: Payer: Self-pay | Admitting: Cardiology

## 2024-01-12 DIAGNOSIS — I2510 Atherosclerotic heart disease of native coronary artery without angina pectoris: Secondary | ICD-10-CM

## 2024-01-19 ENCOUNTER — Other Ambulatory Visit: Payer: Self-pay | Admitting: Cardiology

## 2024-01-19 DIAGNOSIS — I10 Essential (primary) hypertension: Secondary | ICD-10-CM

## 2024-01-28 ENCOUNTER — Ambulatory Visit: Payer: 59 | Admitting: Pulmonary Disease

## 2024-01-28 ENCOUNTER — Encounter: Payer: Self-pay | Admitting: Pulmonary Disease

## 2024-01-28 VITALS — BP 116/74 | HR 86 | Ht 66.0 in | Wt 179.6 lb

## 2024-01-28 DIAGNOSIS — Z87891 Personal history of nicotine dependence: Secondary | ICD-10-CM | POA: Diagnosis not present

## 2024-01-28 DIAGNOSIS — J4540 Moderate persistent asthma, uncomplicated: Secondary | ICD-10-CM | POA: Diagnosis not present

## 2024-01-28 MED ORDER — ADVAIR HFA 230-21 MCG/ACT IN AERO: 2.0000 | INHALATION_SPRAY | Freq: Two times a day (BID) | RESPIRATORY_TRACT | 3 refills | Status: AC

## 2024-01-28 NOTE — Progress Notes (Signed)
 Shawn Chang    [id number]    [birth year]  Primary Care Physician:Patient, No Pcp Per  Referring Physician: No referring provider defined for this encounter.  Chief complaint:  Follow up for evaluation of recurrent bronchitis.  HPI: [name] is a 63 y.o. with past medical history of CABG 2013, coronary artery disease. He has been referred by Dr. Jacinto Halim for evaluation of recurrent bronchitis. He's had 2 exacerbations and had to go on a prednisone taper in April and June of this year. He has daily symptoms of cough, wheeze. He has significant allergies and sinus problems. He had seen an allergist several years in the past and had skin testing which demonstrated sensitivity to pet dander, dust.   Pets: Dogs Occupation: He works as a Merchandiser, retail and leads an active outdoor life with hunting but is now into Designer, jewellery. Exposures: No mold, hot tub, Jacuzzi.  No feather pillows or comforters Smoking history: 59-pack-year smoker.  Quit in 2016 Travel history: No significant travel history Relevant family history: Father had emphysema from smoking.  Interim history:  Discussed the use of AI scribe software for clinical note transcription with the patient, who gave verbal consent to proceed.  History of Present Illness Shawn Chang is a 63 year old male with asthma who presents for follow-up regarding asthma management.  He has been using a quick-relief inhaler since 2017 and continues to use it as needed. He has visited the emergency room multiple times this month due to asthma exacerbations, often visiting an ER on [address] when needed.  He was switched to Advair at an unspecified time and uses it inconsistently, often forgetting doses. When he does not take Advair, he starts to feel 'a little wheezy' and experiences worsening symptoms, prompting him to go to the emergency room and resume taking inhaler.  He has had issues with refills in the past.   He last had an ED visit on 12/08/2023 for asthma exacerbation.  He has a history of coronary artery disease and underwent six bypass surgeries in 2016. He was advised against frequent antibiotic use to prevent resistance, as he may need them in the future due to his heart condition. Instead, he receives steroids like prednisone for asthma exacerbations.  He quit smoking in 2016 after his bypass surgery, having smoked up to a pack a day since age 59. His father had emphysema and smoked until his death. He has no known exposure to mold, hot tubs, jacuzzis, feather pillows, or comforters. He has dogs at home, which require regular washing to prevent asthma issues.  He experiences occasional earaches, which have been a recurring issue since his teenage years, but they typically resolve within an hour.    Outpatient Encounter Medications as of 01/28/2024  Medication Sig   albuterol (VENTOLIN HFA) 108 (90 Base) MCG/ACT inhaler Inhale 2 puffs into the lungs every 6 (six) hours as needed for wheezing or shortness of breath (Cough).   amLODipine (NORVASC) 5 MG tablet Take 0.5 tablets (2.5 mg total) by mouth daily. (Patient taking differently: Take 5 mg by mouth daily.)   aspirin EC 81 MG tablet Take 81 mg by mouth daily.   atorvastatin (LIPITOR) 40 MG tablet TAKE 1 TABLET BY MOUTH EVERY DAY   fluticasone (FLONASE) 50 MCG/ACT nasal spray Place 1 spray into both nostrils daily. Begin by using 2 sprays in each nare daily for 3 to 5 days, then decrease to 1 spray  in each nare daily.   guaifenesin (HUMIBID E) 400 MG TABS tablet Take 1 tablet 3 times daily as needed for chest congestion and cough   isosorbide mononitrate (IMDUR) 30 MG 24 hr tablet TAKE 1 TABLET BY MOUTH EVERY DAY   losartan (COZAAR) 100 MG tablet Take 1 tablet (100 mg total) by mouth every evening.   metoprolol succinate (TOPROL-XL) 100 MG 24 hr tablet Take 1 tablet (100 mg total) by mouth every morning. WITH OR IMMEDIATELY FOLLOWING A MEAL    nitroGLYCERIN (NITROSTAT) 0.4 MG SL tablet Place 1 tablet (0.4 mg total) under the tongue every 5 (five) minutes as needed for chest pain.   omeprazole (PRILOSEC) 10 MG capsule Take 10 mg by mouth daily.   [DISCONTINUED] ADVAIR HFA 230-21 MCG/ACT inhaler Inhale 2 puffs into the lungs 2 (two) times daily.   ADVAIR HFA 230-21 MCG/ACT inhaler Inhale 2 puffs into the lungs 2 (two) times daily.   cetirizine (ZYRTEC ALLERGY) 10 MG tablet Take 1 tablet (10 mg total) by mouth at bedtime.   No facility-administered encounter medications on file as of 01/28/2024.    Physical Exam: Blood pressure 116/74, pulse 86, height 5\' 6"  (1.676 m), weight 179 lb 9.6 oz (81.5 kg), SpO2 95%. Gen:      No acute distress HEENT:  EOMI, sclera anicteric Neck:     No masses; no thyromegaly Lungs:    Clear to auscultation bilaterally; normal respiratory effort CV:         Regular rate and rhythm; no murmurs Abd:      + bowel sounds; soft, non-tender; no palpable masses, no distension Ext:    No edema; adequate peripheral perfusion Skin:      Warm and dry; no rash Neuro: alert and oriented x 3 Psych: normal mood and affect   Data Reviewed: Imaging: CT chest 03/03/16-no acute lung abnormalities.  Chest x-ray 06/30/2023-mild centrilobular thickening I had reviewed the images personally.  PFTs 10/08/15 FVC 3.54 [82%) FEV1 2.54 [76%) F/F 72 Minimal obstructive airway disease.  09/29/16 FVC 2.50 (82%) FEV1 2.87 [87%) F/F 82 TLC 94% DLCO 86% Minimal obstructive airway disease  FENO 09/29/16- 14  Labs: CBC  06/16/16- Absolute eosinophil count- 800/microL 06/26/16-Absolute eosinophil count- 821/microL 10/05/2023-absolute eosinophil count-485/microL Blood allergy profile 06/26/16-sensitive for Dog dander. IgE-18  Assessment:  Assessment & Plan Asthma [name] likely has asthma based on his symptoms and history of atopic allergies. He's been on several prednisone tapers recently with daily symptoms of cough,  wheezing. Labs noted with elevated eosinophils.  He has intermittent exacerbations. He inconsistently uses Advair, leading to increased symptoms and emergency care needs. Regular use of a maintenance inhaler is crucial to control inflammation and prevent exacerbations, reducing the need for prednisone, which is preferred over antibiotics to avoid resistance.  - Renew Advair prescription for one year - Educate on the importance of daily use of Advair to maintain asthma control  Ex-smoker Former smoker with significant history, having quit in 2016 post-bypass surgery. Increased risk for lung nodules and other pulmonary issues due to smoking history. Annual CT scans are recommended for early detection of lung nodules, which are curable if caught early. - Order annual chest CT for lung cancer screening  Coronary artery disease status post coronary artery bypass grafting Coronary artery disease with six bypasses performed in 2016. Under the care of cardiologist Dr. Rosemary Holms.   Plan/Recommendations: - Continue Advair - Refer for low-dose screening CT  Chilton Greathouse MD [facility] Pager  [phone] 01/28/2024, 8:49 AM  CC: Elder Negus, MD

## 2024-01-28 NOTE — Patient Instructions (Signed)
 VISIT SUMMARY:  You came in today for a follow-up on your asthma management. We discussed your current treatment plan, including your use of Advair and quick-relief inhalers, and addressed your recent emergency room visits due to asthma exacerbations. We also reviewed your history of coronary artery disease and smoking cessation.  YOUR PLAN:  -ASTHMA: Asthma is a condition where your airways become inflamed and narrow, making it hard to breathe. You need to use Advair daily to keep your asthma under control and reduce the chances of needing emergency care. We renewed your Advair prescription for one year and discussed the importance of taking it consistently.  -SMOKING CESSATION: You quit smoking in 2016, which is great for your overall health. However, because of your smoking history, you are at a higher risk for lung issues. We recommend an annual chest CT scan to catch any lung nodules early, as they are more treatable when found early.   INSTRUCTIONS:  Please make sure to take your Advair daily as prescribed. Schedule your annual chest CT scan for lung cancer screening.

## 2024-02-24 ENCOUNTER — Ambulatory Visit
Admission: EM | Admit: 2024-02-24 | Discharge: 2024-02-24 | Disposition: A | Discharge status: Home / Self Care | Attending: Family Medicine | Admitting: Family Medicine

## 2024-02-24 DIAGNOSIS — H6501 Acute serous otitis media, right ear: Secondary | ICD-10-CM

## 2024-02-24 MED ORDER — AMOXICILLIN-POT CLAVULANATE 875-125 MG PO TABS: 1.0000 | ORAL_TABLET | Freq: Two times a day (BID) | ORAL | 0 refills | Status: DC

## 2024-02-24 MED ORDER — PREDNISONE 20 MG PO TABS: ORAL_TABLET | ORAL | 0 refills | Status: DC

## 2024-02-24 MED ORDER — CETIRIZINE HCL 10 MG PO TABS: 10.0000 mg | ORAL_TABLET | Freq: Every day | ORAL | 0 refills | Status: AC

## 2024-02-24 NOTE — ED Triage Notes (Signed)
 Pt c/o pain to both sides of neck and right side ear pain x 1 month-bleeding from right ear during the first week of sxs also c/o knot to right side of neck x 1 year-denies fevers and injury-states he has not sought medical tx for any c/o -takes tylenol  for pain-NAD-steady gait

## 2024-02-24 NOTE — Discharge Instructions (Addendum)
 Start Augmentin  for the right ear infection. Continue taking Zyrtec . We will use prednisone  to help with the inflammation, lymphadenopathy. Follow up with the ENT specialist as soon as possible.

## 2024-02-24 NOTE — ED Provider Notes (Signed)
 [facility]  Note:  This document was prepared using Conservation officer, historic buildings and may include unintentional dictation errors.  MRN: [id number] DOB: Mar 08, 1961  Subjective:   Shawn Chang is a 63 y.o. male presenting for 1 month history of persistent intermittent right ear pain that generally can be sharp and stabbing.  Radiates pain to the neck muscles and causes stiffness.  Has also noticed lymph node swelling around the ear.  Symptoms started out with a head cold and severe right ear pain.  He had bloody discharge for 1.5 weeks.  Slowly his sinus symptoms resolved but the ear continues to have issues.  Patient has allergies, is not taking his Zyrtec  now.  No current facility-administered medications for this encounter.  Current Outpatient Medications:    ADVAIR  HFA 230-21 MCG/ACT inhaler, Inhale 2 puffs into the lungs 2 (two) times daily., Disp: 360 each, Rfl: 3   albuterol  (VENTOLIN  HFA) 108 (90 Base) MCG/ACT inhaler, Inhale 2 puffs into the lungs every 6 (six) hours as needed for wheezing or shortness of breath (Cough)., Disp: 18 g, Rfl: 0   amLODipine  (NORVASC ) 5 MG tablet, Take 0.5 tablets (2.5 mg total) by mouth daily. (Patient taking differently: Take 5 mg by mouth daily.), Disp: , Rfl:    aspirin  EC 81 MG tablet, Take 81 mg by mouth daily., Disp: , Rfl:    atorvastatin  (LIPITOR ) 40 MG tablet, TAKE 1 TABLET BY MOUTH EVERY DAY, Disp: 90 tablet, Rfl: 3   cetirizine  (ZYRTEC  ALLERGY ) 10 MG tablet, Take 1 tablet (10 mg total) by mouth at bedtime., Disp: 90 tablet, Rfl: 1   fluticasone  (FLONASE ) 50 MCG/ACT nasal spray, Place 1 spray into both nostrils daily. Begin by using 2 sprays in each nare daily for 3 to 5 days, then decrease to 1 spray in each nare daily., Disp: 48 mL, Rfl: 1   guaifenesin  (HUMIBID E) 400 MG TABS tablet, Take 1 tablet 3 times daily as needed for chest congestion and cough, Disp: 21 tablet, Rfl: 0   isosorbide  mononitrate (IMDUR ) 30 MG  24 hr tablet, TAKE 1 TABLET BY MOUTH EVERY DAY, Disp: 90 tablet, Rfl: 2   losartan  (COZAAR ) 100 MG tablet, Take 1 tablet (100 mg total) by mouth every evening., Disp: , Rfl:    metoprolol  succinate (TOPROL -XL) 100 MG 24 hr tablet, Take 1 tablet (100 mg total) by mouth every morning. WITH OR IMMEDIATELY FOLLOWING A MEAL, Disp: 90 tablet, Rfl: 2   nitroGLYCERIN  (NITROSTAT ) 0.4 MG SL tablet, Place 1 tablet (0.4 mg total) under the tongue every 5 (five) minutes as needed for chest pain., Disp: 25 tablet, Rfl: 6   omeprazole (PRILOSEC) 10 MG capsule, Take 10 mg by mouth daily., Disp: , Rfl:    Allergies  Allergen Reactions   Other Shortness Of Breath    Dog dander: Allergy -induced asthma   Morphine  Nausea And Vomiting   Morphine  And Codeine Nausea And Vomiting    Past Medical History:  Diagnosis Date   Acid reflux    Asthma    Coronary artery disease    Hyperlipidemia    Hypertension    Myocardial infarction [facility])    Retinal detachment      Past Surgical History:  Procedure Laterality Date   CARDIAC CATHETERIZATION N/A 10/01/2015   Procedure: Left Heart Cath and Coronary Angiography;  Surgeon: Knox Perl, MD;  Location: [facility] INVASIVE CV LAB;  Service: Cardiovascular;  Laterality: N/A;   CARDIAC CATHETERIZATION N/A 10/08/2015  Procedure: Coronary Stent Intervention;  Surgeon: Knox Perl, MD;  Location: [facility] INVASIVE CV LAB;  Service: Cardiovascular;  Laterality: N/A;   CORONARY ARTERY BYPASS GRAFT N/A 10/12/2015   Procedure: CORONARY ARTERY BYPASS GRAFTING (CABG) x five , using left internal mammary artery and bilateral legs greater saphenous vein harvested endoscopically;  Surgeon: Heriberto London, MD;  Location: [facility] OR;  Service: Open Heart Surgery;  Laterality: N/A;   CORONARY ARTERY BYPASS GRAFT     KNEE SURGERY     PERIPHERAL VASCULAR CATHETERIZATION  10/08/2015   Procedure: Abdominal Aortogram;  Surgeon: Knox Perl, MD;  Location: [facility] INVASIVE CV LAB;  Service: Cardiovascular;;   PERIPHERAL  VASCULAR CATHETERIZATION  10/08/2015   Procedure: Aortic Arch Angiography;  Surgeon: Knox Perl, MD;  Location: [facility] INVASIVE CV LAB;  Service: Cardiovascular;;   shoulder surgery     TEE WITHOUT CARDIOVERSION N/A 10/12/2015   Procedure: TRANSESOPHAGEAL ECHOCARDIOGRAM (TEE);  Surgeon: Heriberto London, MD;  Location: [facility] OR;  Service: Open Heart Surgery;  Laterality: N/A;   TONSILLECTOMY     Pt has also had sinus and throat surgery    Family History  Problem Relation Age of Onset   Heart disease Mother        May 2017   Heart disease Father    Hypertension Sister    Stroke Sister    Heart attack Brother        massive, survived   Heart attack Brother     Social History   Tobacco Use   Smoking status: Former    Current packs/day: 0.00    Average packs/day: 1.5 packs/day for 39.0 years (58.5 ttl pk-yrs)    Types: Cigarettes    Start date: 08/30/1976    Quit date: 08/31/2015    Years since quitting: 8.4    Passive exposure: Past   Smokeless tobacco: Never  Vaping Use   Vaping status: Never Used  Substance Use Topics   Alcohol use: Yes    Comment: occ   Drug use: No    ROS   Objective:   Vitals: BP 127/76 (BP Location: Left Arm)   Temp (!) 82 F (27.8 C) (Oral)   Resp 20   SpO2 94%   Physical Exam Constitutional:      General: He is not in acute distress.    Appearance: Normal appearance. He is normal weight. He is not ill-appearing, toxic-appearing or diaphoretic.  HENT:     Head: Normocephalic and atraumatic.     Right Ear: Ear canal and external ear normal. No drainage, swelling or tenderness. No middle ear effusion. There is no impacted cerumen. Tympanic membrane is not erythematous or bulging.     Left Ear: Tympanic membrane, ear canal and external ear normal. No drainage, swelling or tenderness.  No middle ear effusion. There is no impacted cerumen. Tympanic membrane is not erythematous or bulging.     Ears:     Comments: TM with significant bulging, erythema  and effusion.  There is trace dried serous material inferiorly and superiorly in the proximal ear canal.    Nose: No congestion or rhinorrhea.     Mouth/Throat:     Mouth: Mucous membranes are moist.     Pharynx: No oropharyngeal exudate or posterior oropharyngeal erythema.  Eyes:     General: No scleral icterus.       Right eye: No discharge.        Left eye: No discharge.     Extraocular Movements: Extraocular movements  intact.     Conjunctiva/sclera: Conjunctivae normal.  Cardiovascular:     Rate and Rhythm: Normal rate.  Pulmonary:     Effort: Pulmonary effort is normal.  Musculoskeletal:     Cervical back: Normal range of motion and neck supple. No rigidity. No muscular tenderness.  Lymphadenopathy:     Cervical: Cervical adenopathy (right superior) present.  Neurological:     General: No focal deficit present.     Mental Status: He is alert and oriented to person, place, and time.  Psychiatric:        Mood and Affect: Mood normal.        Behavior: Behavior normal.     Assessment and Plan :   PDMP not reviewed this encounter.  1. Non-recurrent acute serous otitis media of right ear    Emphasized need to follow-up with an ENT specialist urgently.  Start Augmentin  for his significant right otitis media infection.  Recommend prednisone  for the inflammatory response.  Restart Zyrtec .  Counseled patient on potential for adverse effects with medications prescribed/recommended today, ER and return-to-clinic precautions discussed, patient verbalized understanding.    Adolph Hoop, PA-C 02/24/24 [id number]

## 2024-05-11 ENCOUNTER — Other Ambulatory Visit: Payer: Self-pay | Admitting: Cardiology

## 2024-05-11 DIAGNOSIS — I10 Essential (primary) hypertension: Secondary | ICD-10-CM

## 2024-06-10 ENCOUNTER — Other Ambulatory Visit: Payer: Self-pay | Admitting: Cardiology

## 2024-07-07 ENCOUNTER — Other Ambulatory Visit: Payer: Self-pay | Admitting: Cardiology

## 2024-10-06 ENCOUNTER — Emergency Department (HOSPITAL_COMMUNITY)

## 2024-10-06 ENCOUNTER — Telehealth: Payer: Self-pay | Admitting: Cardiology

## 2024-10-06 ENCOUNTER — Observation Stay (HOSPITAL_COMMUNITY)
Admission: EM | Admit: 2024-10-06 | Discharge: 2024-10-07 | Disposition: A | Attending: Emergency Medicine | Admitting: Emergency Medicine

## 2024-10-06 ENCOUNTER — Other Ambulatory Visit: Payer: Self-pay

## 2024-10-06 ENCOUNTER — Encounter (HOSPITAL_COMMUNITY): Payer: Self-pay | Admitting: *Deleted

## 2024-10-06 DIAGNOSIS — E785 Hyperlipidemia, unspecified: Secondary | ICD-10-CM

## 2024-10-06 DIAGNOSIS — I10 Essential (primary) hypertension: Secondary | ICD-10-CM

## 2024-10-06 DIAGNOSIS — R110 Nausea: Secondary | ICD-10-CM

## 2024-10-06 DIAGNOSIS — R079 Chest pain, unspecified: Secondary | ICD-10-CM | POA: Diagnosis not present

## 2024-10-06 LAB — TROPONIN I (HIGH SENSITIVITY)
Troponin I (High Sensitivity): 4 ng/L (ref <18)
Troponin I (High Sensitivity): 4 ng/L (ref <18)

## 2024-10-06 LAB — CBC
HCT: 47.7 % (ref 39.0–52.0)
Hemoglobin: 15.7 g/dL (ref 13.0–17.0)
MCH: 29.5 pg (ref 26.0–34.0)
MCHC: 32.9 g/dL (ref 30.0–36.0)
MCV: 89.7 fL (ref 80.0–100.0)
Platelets: 218 K/uL (ref 150–400)
RBC: 5.32 MIL/uL (ref 4.22–5.81)
RDW: 12.8 % (ref 11.5–15.5)
WBC: 8.7 K/uL (ref 4.0–10.5)
nRBC: 0 % (ref 0.0–0.2)

## 2024-10-06 LAB — BASIC METABOLIC PANEL WITH GFR
Anion gap: 13 (ref 5–15)
BUN: 18 mg/dL (ref 8–23)
CO2: 26 mmol/L (ref 22–32)
Calcium: 9.7 mg/dL (ref 8.9–10.3)
Chloride: 102 mmol/L (ref 98–111)
Creatinine, Ser: 1.33 mg/dL — ABNORMAL HIGH (ref 0.61–1.24)
GFR, Estimated: >60 mL/min (ref >60)
Glucose, Bld: 118 mg/dL — ABNORMAL HIGH (ref 70–99)
Potassium: 4.1 mmol/L (ref 3.5–5.1)
Sodium: 141 mmol/L (ref 135–145)

## 2024-10-06 LAB — ECHOCARDIOGRAM COMPLETE
AR max vel: 2.22 cm2
AV Area VTI: 2.17 cm2
AV Area mean vel: 2.47 cm2
AV Mean grad: 6 mmHg
AV Peak grad: 13.2 mmHg
Ao pk vel: 1.82 m/s
Area-P 1/2: 2.9 cm2
Calc EF: 56.8 %
Height: 66 in
S' Lateral: 3.3 cm
Single Plane A2C EF: 59.7 %
Single Plane A4C EF: 56.7 %
Weight: 2784 [oz_av]

## 2024-10-06 MED ORDER — ATORVASTATIN CALCIUM 40 MG PO TABS
40.0000 mg | ORAL_TABLET | Freq: Every day | ORAL | Status: DC
  Administered 2024-10-07: 40 mg via ORAL
  Filled 2024-10-06 (×2): qty 1

## 2024-10-06 MED ORDER — ACETAMINOPHEN 325 MG PO TABS: 650.0000 mg | ORAL_TABLET | ORAL | Status: DC | PRN

## 2024-10-06 MED ORDER — PANTOPRAZOLE SODIUM 40 MG PO TBEC
40.0000 mg | DELAYED_RELEASE_TABLET | Freq: Every day | ORAL | Status: DC
  Administered 2024-10-07: 40 mg via ORAL
  Filled 2024-10-06 (×2): qty 1

## 2024-10-06 MED ORDER — ISOSORBIDE MONONITRATE ER 30 MG PO TB24
30.0000 mg | ORAL_TABLET | Freq: Every day | ORAL | Status: DC
  Administered 2024-10-07: 30 mg via ORAL
  Filled 2024-10-06: qty 1

## 2024-10-06 MED ORDER — ENOXAPARIN SODIUM 40 MG/0.4ML IJ SOSY
40.0000 mg | PREFILLED_SYRINGE | INTRAMUSCULAR | Status: DC
  Administered 2024-10-06: 40 mg via SUBCUTANEOUS
  Filled 2024-10-06: qty 0.4

## 2024-10-06 MED ORDER — FLUTICASONE FUROATE-VILANTEROL 200-25 MCG/ACT IN AEPB: 1.0000 | INHALATION_SPRAY | Freq: Every day | RESPIRATORY_TRACT | Status: DC

## 2024-10-06 MED ORDER — ALBUTEROL SULFATE (2.5 MG/3ML) 0.083% IN NEBU: 2.5000 mg | INHALATION_SOLUTION | Freq: Four times a day (QID) | RESPIRATORY_TRACT | Status: DC | PRN

## 2024-10-06 MED ORDER — NITROGLYCERIN 0.4 MG SL SUBL: 0.4000 mg | SUBLINGUAL_TABLET | SUBLINGUAL | Status: DC | PRN

## 2024-10-06 MED ORDER — ONDANSETRON HCL 4 MG/2ML IJ SOLN: 4.0000 mg | Freq: Four times a day (QID) | INTRAMUSCULAR | Status: DC | PRN

## 2024-10-06 MED ORDER — ASPIRIN 81 MG PO TBEC
81.0000 mg | DELAYED_RELEASE_TABLET | Freq: Every day | ORAL | Status: DC
  Administered 2024-10-07: 81 mg via ORAL
  Filled 2024-10-06 (×2): qty 1

## 2024-10-06 MED ORDER — AMLODIPINE BESYLATE 5 MG PO TABS
5.0000 mg | ORAL_TABLET | Freq: Every day | ORAL | Status: DC
  Administered 2024-10-07: 5 mg via ORAL
  Filled 2024-10-06 (×2): qty 1

## 2024-10-06 MED ORDER — METOPROLOL SUCCINATE ER 100 MG PO TB24
100.0000 mg | ORAL_TABLET | Freq: Every morning | ORAL | Status: DC
  Administered 2024-10-07: 100 mg via ORAL
  Filled 2024-10-06: qty 1
  Filled 2024-10-06: qty 4

## 2024-10-06 NOTE — Telephone Encounter (Signed)
 Stat call for irregular heartbeat, chest pain, and nausea. Please advise

## 2024-10-06 NOTE — ED Notes (Signed)
 Asked phlebotomy to attempt to get troponin, phleb tech agreed.

## 2024-10-06 NOTE — H&P (Signed)
 Cardiology H&P   Patient ID: Shawn Chang MRN: [id number]; DOB: 1961/07/18  Admit date: 10/06/2024 Date of Consult: 10/06/2024  PCP:  Patient, No Pcp Per   [facility] Providers Cardiologist:  Newman PARAS Lawrence, MD     Patient Profile: Shawn Chang is a 63 y.o. male with a hx of CAD s/p CABG in 2016, hypertension, hyperlipidemia, GERD who is being seen 10/06/2024 for the evaluation of chest pain at the request of Dr. Levander.  History of Present Illness: [name] is a 63 year old male with above medical history who is followed by Dr. Lawrence.  Patient previously had CABG in 2016 with LIMA-LAD, lt radial-OM1, SVG-OM 2 and OM 3, SVG-PDA.  Most recent ischemic evaluation was a stress test in 02/2021 that showed a small sized, mild intensity, reversible perfusion defect in basal inferior/inferoseptal myocardium.  EF was 61%.  Overall felt to be low risk study.  Echocardiogram in 02/2021 showed EF 67% with grade 1 DD, moderate AI.  Patient had syncopal episode in 07/2023, felt to be orthostatic or due to vasovagal etiology.  Amlodipine  was discontinued and Imdur  was reduced.  Echocardiogram 09/10/2023 showed EF 55-60%, no wall motion abnormalities, grade 1 DD, normal RV systolic function, mild MR, mild AI.  Cardiac monitor showed no significant arrhythmias, patient called the office with complaints of hypertension and amlodipine  was resumed at 2.5 mg daily  Patient was last seen by Dr. Lawrence in 09/2023.  At that time, patient was doing well.  Patient presented to the ED on 10/06/2024.  Reported that at 2 AM, he had shortness of breath, sweating, left shoulder and hand pain, nausea.  Also had a lot of belching.  Initial vital signs showed BP 116/79, oxygen 99% on room air, heart rate 78 bpm. EKG showed normal sinus rhythm with heart rate 83 bpm, RBBB (old).  High-sensitivity troponin was negative x 2.  CBC within normal limits.  BMP with creatinine 1.33.  Chest x-ray with no  active cardiopulmonary disease.  On interview, patient tells me that he has been in his usual state of health recently.  About 2 weeks ago, he had an episode of chest pressure and burping.  Last about 2 minutes then resolved.  He stays active through his job.  Often has to carry heavy things and walk around quite a bit for work.  He denies having chest pain with exertion.  He has had some shortness of breath when walking outside in the cold.  He went to bed yesterday as usual.  Woke up about 2:30 AM with left shoulder and left hand pain.  He also felt nauseous and was burping quite a bit.  This had nausea, burping, and left shoulder pain came and went for a while.  He got up and started getting ready for work and continues to have episodes of nausea and burping.  When driving to work, he had left shoulder and left arm pain so he decided to come to the ED for evaluation.  Since being in the ED, he has been feeling well.  No longer having any nausea, burping, left arm pain.    Past Medical History:  Diagnosis Date   Acid reflux    Asthma    Coronary artery disease    Hyperlipidemia    Hypertension    Myocardial infarction [facility])    Retinal detachment     Past Surgical History:  Procedure Laterality Date   CARDIAC CATHETERIZATION N/A 10/01/2015   Procedure: Left Heart  Cath and Coronary Angiography;  Surgeon: Gordy Bergamo, MD;  Location: [facility] INVASIVE CV LAB;  Service: Cardiovascular;  Laterality: N/A;   CARDIAC CATHETERIZATION N/A 10/08/2015   Procedure: Coronary Stent Intervention;  Surgeon: Gordy Bergamo, MD;  Location: [facility] INVASIVE CV LAB;  Service: Cardiovascular;  Laterality: N/A;   CORONARY ARTERY BYPASS GRAFT N/A 10/12/2015   Procedure: CORONARY ARTERY BYPASS GRAFTING (CABG) x five , using left internal mammary artery and bilateral legs greater saphenous vein harvested endoscopically;  Surgeon: Maude Fleeta Ochoa, MD;  Location: [facility] OR;  Service: Open Heart Surgery;  Laterality: N/A;   CORONARY ARTERY  BYPASS GRAFT     KNEE SURGERY     PERIPHERAL VASCULAR CATHETERIZATION  10/08/2015   Procedure: Abdominal Aortogram;  Surgeon: Gordy Bergamo, MD;  Location: [facility] INVASIVE CV LAB;  Service: Cardiovascular;;   PERIPHERAL VASCULAR CATHETERIZATION  10/08/2015   Procedure: Aortic Arch Angiography;  Surgeon: Gordy Bergamo, MD;  Location: [facility] INVASIVE CV LAB;  Service: Cardiovascular;;   shoulder surgery     TEE WITHOUT CARDIOVERSION N/A 10/12/2015   Procedure: TRANSESOPHAGEAL ECHOCARDIOGRAM (TEE);  Surgeon: Maude Fleeta Ochoa, MD;  Location: [facility] OR;  Service: Open Heart Surgery;  Laterality: N/A;   TONSILLECTOMY     Pt has also had sinus and throat surgery     Scheduled Meds:  Continuous Infusions:  PRN Meds:   Allergies:    Allergies  Allergen Reactions   Other Shortness Of Breath    Dog dander: Allergy -induced asthma   Morphine  Nausea And Vomiting   Morphine  And Codeine Nausea And Vomiting    Social History:   Social History   Socioeconomic History   Marital status: Married    Spouse name: Not on file   Number of children: 2   Years of education: Not on file   Highest education level: Not on file  Occupational History   Not on file  Tobacco Use   Smoking status: Former    Current packs/day: 0.00    Average packs/day: 1.5 packs/day for 39.0 years (58.5 ttl pk-yrs)    Types: Cigarettes    Start date: 08/30/1976    Quit date: 08/31/2015    Years since quitting: 9.1    Passive exposure: Past   Smokeless tobacco: Never  Vaping Use   Vaping status: Never Used  Substance and Sexual Activity   Alcohol use: Yes    Comment: occ   Drug use: No   Sexual activity: Yes  Other Topics Concern   Not on file  Social History Narrative   Married, lives with spouse   2 children   Merchandiser, Retail w/ extensive traveling throughout the [place]  - travels 48weeks per year   Social Drivers of Health   Financial Resource Strain: Not on file  Food Insecurity: Not on file  Transportation Needs: Not on file   Physical Activity: Not on file  Stress: Not on file  Social Connections: Not on file  Intimate Partner Violence: Not on file    Family History:   Family History  Problem Relation Age of Onset   Heart disease Mother        May 2017   Heart disease Father    Hypertension Sister    Stroke Sister    Heart attack Brother        massive, survived   Heart attack Brother      ROS:  Please see the history of present illness.   All other ROS reviewed and negative.  Physical Exam/Data: Vitals:   10/06/24 1056 10/06/24 1057 10/06/24 1100 10/06/24 1115  BP:   127/84 134/86  Pulse: 70  62 63  Resp: 19  16 16   Temp:  98.1 F (36.7 C)    TempSrc:  Oral    SpO2: 96%  97% 99%  Weight:      Height:       No intake or output data in the 24 hours ending 10/06/24 1212    10/06/2024    6:52 AM 01/28/2024    8:22 AM 10/05/2023   11:39 AM  Last 3 Weights  Weight (lbs) 174 lb 179 lb 9.6 oz 176 lb  Weight (kg) 78.926 kg 81.466 kg 79.833 kg     Body mass index is 28.08 kg/m.  General:  Well nourished, well developed, in no acute distress.  Laying flat in bed with head elevated HEENT: normal Neck: no JVD Vascular: Radial pulses 2+ bilaterally Cardiac:  normal S1, S2; RRR; no murmur  Lungs:  clear to auscultation bilaterally, no wheezing, rhonchi or rales.  Normal work of breathing on room air Abd: soft, tenderness to palpation over left upper quadrant Ext: no edema in bilateral lower extremities Musculoskeletal:  No deformities Skin: warm and dry  Neuro:  CNs 2-12 intact, no focal abnormalities noted Psych:  Normal affect   EKG:  The EKG was personally reviewed and demonstrates: Normal sinus rhythm with RBBB Telemetry:  Telemetry was personally reviewed and demonstrates: Normal sinus rhythm  Relevant CV Studies:   Laboratory Data: High Sensitivity Troponin:   Recent Labs  Lab 10/06/24 0658 10/06/24 0910  TROPONINIHS 4 4     Chemistry Recent Labs  Lab 10/06/24 0658   NA 141  K 4.1  CL 102  CO2 26  GLUCOSE 118*  BUN 18  CREATININE 1.33*  CALCIUM  9.7  GFRNONAA >60  ANIONGAP 13    No results for input(s): PROT, ALBUMIN , AST, ALT, ALKPHOS, BILITOT in the last 168 hours. Lipids No results for input(s): CHOL, TRIG, HDL, LABVLDL, LDLCALC, CHOLHDL in the last 168 hours.  Hematology Recent Labs  Lab 10/06/24 0658  WBC 8.7  RBC 5.32  HGB 15.7  HCT 47.7  MCV 89.7  MCH 29.5  MCHC 32.9  RDW 12.8  PLT 218   Thyroid No results for input(s): TSH, FREET4 in the last 168 hours.  BNPNo results for input(s): BNP, PROBNP in the last 168 hours.  DDimer No results for input(s): DDIMER in the last 168 hours.  Radiology/Studies:  DG Chest 2 View Result Date: 10/06/2024 CLINICAL DATA:  Chest and abdominal pain. EXAM: CHEST - 2 VIEW COMPARISON:  06/30/2023 FINDINGS: The lungs are clear without focal pneumonia, edema, pneumothorax or pleural effusion. The cardiopericardial silhouette is within normal limits for size. No acute bony abnormality. IMPRESSION: No active cardiopulmonary disease. Electronically Signed   By: Camellia Candle M.D.   On: 10/06/2024 07:20     Assessment and Plan:  L shoulder pain, nausea, burping History of CAD - Patient previously had CABG in 2016 with LIMA-LAD, LT radial-OM1, SVG-OM 2 and OM 3, SVG-PDA.  Stress test in 02/2021 was low risk study - Patient had an episode of chest pressure associated with burping 2 weeks ago.  Last about 2 minutes and resolves on its own.  Woke up early this morning with left shoulder/hand pain, nausea, burping.  Symptoms came and went throughout the morning.  He did have an episode of chest pain with deep inhalation.  Decided come to  the ED to get checked out - Patient tells me that he is active through his job, often carrying heavy things and walking around.  He does not have chest pain with exertion - EKG with RBBB.  High-sensitivity troponin negative x 2. - Chest pain is  atypical, likely GERD related given burping.  Pain has completely resolved.  EKG and troponin reassuring.  However, given patient's cardiac history would recommend noninvasive evaluation.  Plan for stress test. Can be completed tomorrow AM  - Ordered echo  - Continue aspirin  81 mg daily, Lipitor  40 mg daily, Imdur  30 mg daily, Toprol  all succinate 100 mg daily, amlodipine  5 mg daily  Hypertension - BP well-controlled.  On amlodipine  5 mg daily, Imdur  30 mg daily, losartan  100 mg daily, metoprolol  succinate 100 mg daily at home  Hyperlipidemia - Continue lipitor  40 mg daily. LDL 28 in 09/2023  Admit to observation.    Risk Assessment/Risk Scores:   For questions or updates, please contact [facility] Please consult [email or web address] for contact info under     Signed, Rollo FABIENE Louder, PA-C  10/06/2024 12:12 PM

## 2024-10-06 NOTE — ED Triage Notes (Signed)
 Patient states since 2am this morning  he has had sob sweating left shoulder and hand pain and nausea all this is off and on, states currently no sx. C/o chest pain only with inspiration . C/o lots of belching earlier however that has subsided as well.

## 2024-10-06 NOTE — ED Provider Notes (Addendum)
 [facility] EMERGENCY DEPARTMENT AT [facility] Provider Note   CSN: [id number] Arrival date & time: 10/06/24  9361     Patient presents with: Chest Pain and Nausea   Shawn Chang is a 63 y.o. male.   HPI 89 male history of coronary artery disease status post bypass in 2017 presents today with left shoulder pain that woke him from sleep.  It was atraumatic and there is no known injury.  He states that the pain radiated down into his hand.  It lasted about an hour and then resolved.  He had 2 episodes since then but is currently pain-free.  He had some nausea and burping associated with this.  No shortness of breath although he has noted some dyspnea when he has been out in the cold over the past few days.  He denies any fever, chills, vomiting, or diarrhea.  He has not had any similar symptoms in the past.    Prior to Admission medications   Medication Sig Start Date End Date Taking? Authorizing Provider  ADVAIR  HFA 230-21 MCG/ACT inhaler Inhale 2 puffs into the lungs 2 (two) times daily. 01/28/24 01/27/25  Mannam, Praveen, MD  albuterol  (VENTOLIN  HFA) 108 (90 Base) MCG/ACT inhaler Inhale 2 puffs into the lungs every 6 (six) hours as needed for wheezing or shortness of breath (Cough). 06/30/23   Christopher Savannah, PA-C  amLODipine  (NORVASC ) 5 MG tablet TAKE 1 TABLET BY MOUTH EVERY DAY 07/09/24   Patwardhan, Newman PARAS, MD  amoxicillin -clavulanate (AUGMENTIN ) 875-125 MG tablet Take 1 tablet by mouth 2 (two) times daily. 02/24/24   Christopher Savannah, PA-C  aspirin  EC 81 MG tablet Take 81 mg by mouth daily.    [provider]  atorvastatin  (LIPITOR ) 40 MG tablet TAKE 1 TABLET BY MOUTH EVERY DAY 11/15/23   Patwardhan, Manish J, MD  cetirizine  (ZYRTEC  ALLERGY ) 10 MG tablet Take 1 tablet (10 mg total) by mouth daily. 02/24/24   Christopher Savannah, PA-C  fluticasone  (FLONASE ) 50 MCG/ACT nasal spray Place 1 spray into both nostrils daily. Begin by using 2 sprays in each nare daily for 3 to 5 days, then  decrease to 1 spray in each nare daily. 01/30/22   Joesph Shaver Scales, PA-C  guaifenesin  (HUMIBID E) 400 MG TABS tablet Take 1 tablet 3 times daily as needed for chest congestion and cough 01/30/22   Joesph Shaver Scales, PA-C  isosorbide  mononitrate (IMDUR ) 30 MG 24 hr tablet TAKE 1 TABLET BY MOUTH EVERY DAY 01/14/24   Patwardhan, Newman PARAS, MD  losartan  (COZAAR ) 100 MG tablet Take 1 tablet (100 mg total) by mouth every evening. 05/13/24   Patwardhan, Newman PARAS, MD  metoprolol  succinate (TOPROL -XL) 100 MG 24 hr tablet Take 1 tablet (100 mg total) by mouth every morning. WITH OR IMMEDIATELY FOLLOWING A MEAL 01/21/24   Patwardhan, Manish J, MD  nitroGLYCERIN  (NITROSTAT ) 0.4 MG SL tablet Place 1 tablet (0.4 mg total) under the tongue every 5 (five) minutes as needed for chest pain. 08/23/23   Tolia, Sunit, DO  omeprazole (PRILOSEC) 10 MG capsule Take 10 mg by mouth daily.    [provider]  predniSONE  (DELTASONE ) 20 MG tablet Take 2 tablets daily with breakfast. 02/24/24   Christopher Savannah, PA-C    Allergies: Other, Morphine , and Morphine  and codeine    Review of Systems  Updated Vital Signs BP 134/86   Pulse 63   Temp 98.1 F (36.7 C) (Oral)   Resp 16   Ht 1.676 m (5'  6)   Wt 78.9 kg   SpO2 99%   BMI 28.08 kg/m   Physical Exam Vitals reviewed.  Constitutional:      Appearance: He is well-developed.  HENT:     Head: Normocephalic and atraumatic.  Eyes:     Extraocular Movements: Extraocular movements intact.     Pupils: Pupils are equal, round, and reactive to light.  Cardiovascular:     Rate and Rhythm: Normal rate and regular rhythm.     Heart sounds: Normal heart sounds.  Pulmonary:     Effort: Pulmonary effort is normal.     Breath sounds: Normal breath sounds.  Abdominal:     General: Bowel sounds are normal.     Palpations: Abdomen is soft.  Musculoskeletal:        General: Normal range of motion.     Cervical back: Normal range of motion and neck supple.  Skin:     General: Skin is warm and dry.     Capillary Refill: Capillary refill takes less than 2 seconds.  Neurological:     General: No focal deficit present.     Mental Status: He is alert.  Psychiatric:        Mood and Affect: Mood normal.        Behavior: Behavior normal.     (all labs ordered are listed, but only abnormal results are displayed) Labs Reviewed  BASIC METABOLIC PANEL WITH GFR - Abnormal; Notable for the following components:      Result Value   Glucose, Bld 118 (*)    Creatinine, Ser 1.33 (*)    All other components within normal limits  CBC  HIV ANTIBODY (ROUTINE TESTING W REFLEX)  CBC  CREATININE, SERUM  TROPONIN I (HIGH SENSITIVITY)  TROPONIN I (HIGH SENSITIVITY)    EKG: EKG Interpretation Date/Time:  Monday October 06 2024 06:49:18 EST Ventricular Rate:  83 PR Interval:  170 QRS Duration:  136 QT Interval:  400 QTC Calculation: 470 R Axis:   15  Text Interpretation: Normal sinus rhythm Right bundle branch block Abnormal ECG When compared with ECG of 23-Aug-2023 16:15, No significant change since last tracing Confirmed by Levander Houston [phone]) on 10/06/2024 7:53:02 AM  Radiology: [facility] Chest 2 View Result Date: 10/06/2024 CLINICAL DATA:  Chest and abdominal pain. EXAM: CHEST - 2 VIEW COMPARISON:  06/30/2023 FINDINGS: The lungs are clear without focal pneumonia, edema, pneumothorax or pleural effusion. The cardiopericardial silhouette is within normal limits for size. No acute bony abnormality. IMPRESSION: No active cardiopulmonary disease. Electronically Signed   By: Camellia Candle M.D.   On: 10/06/2024 07:20     .Critical Care  Performed by: Levander Houston, MD Authorized by: Levander Houston, MD   Critical care provider statement:    Critical care time (minutes):  30   Critical care start time:  10/06/2024 12:37 PM   Critical care end time:  10/06/2024 12:37 PM   Critical care time was exclusive of:  Separately billable procedures and treating other patients  and teaching time   Critical care was necessary to treat or prevent imminent or life-threatening deterioration of the following conditions:  Circulatory failure and cardiac failure   Critical care was time spent personally by me on the following activities:  Development of treatment plan with patient or surrogate, discussions with consultants, evaluation of patient's response to treatment, examination of patient, ordering and review of laboratory studies, ordering and review of radiographic studies, ordering and performing treatments and interventions, pulse oximetry, re-evaluation  of patient's condition and review of old charts    Medications Ordered in the ED  aspirin  EC tablet 81 mg (has no administration in time range)  amLODipine  (NORVASC ) tablet 5 mg (has no administration in time range)  atorvastatin  (LIPITOR ) tablet 40 mg (has no administration in time range)  isosorbide  mononitrate (IMDUR ) 24 hr tablet 30 mg (has no administration in time range)  metoprolol  succinate (TOPROL -XL) 24 hr tablet 100 mg (has no administration in time range)  pantoprazole  (PROTONIX ) EC tablet 40 mg (has no administration in time range)  fluticasone  furoate-vilanterol (BREO ELLIPTA ) 200-25 MCG/ACT 1 puff (has no administration in time range)  albuterol  (PROVENTIL ) (2.5 MG/3ML) 0.083% nebulizer solution 2.5 mg (has no administration in time range)  nitroGLYCERIN  (NITROSTAT ) SL tablet 0.4 mg (has no administration in time range)  acetaminophen  (TYLENOL ) tablet 650 mg (has no administration in time range)  ondansetron  (ZOFRAN ) injection 4 mg (has no administration in time range)  enoxaparin  (LOVENOX ) injection 40 mg (has no administration in time range)                                    Medical Decision Making Amount and/or Complexity of Data Reviewed Labs: ordered. Radiology: ordered.  Risk Decision regarding hospitalization.   63 year old male presents today complaining of left shoulder and arm pain  that woke him from sleep and has waxed and waned.  He has also had some belching and nausea.  He has not had any active vomiting, diarrhea, or fever, or chills. Differential diagnosis includes but is not limited to intrinsic shoulder etiologies although no trauma, no redness is noted and no pain with movement doubt intrinsic shoulder, other referred pain including cardiac and lung etiologies, other referred pain such as abdominal etiologies.  EKG shows no acute abnormality with normal troponin.  Pain is dull and pressure-like.  Doubt aortic dissection although left radial pulse is nonpalpable from prior surgery.  Ulnar pulse is intact and normal. Concern for cardiac etiology.  Patient with normal troponin and no acute changes on EKG. Discussed with cardiology who will see for evaluation Patient is currently pain-free 12:36 PM Resting troponin.  Repeat troponin is negative. Awaiting cardiology consultation Cardiology chat states they will be admitting patient.    Final diagnoses:  Chest pain, unspecified type    ED Discharge Orders     None          Levander Houston, MD 10/06/24 1237    Levander Houston, MD 10/06/24 [phone]

## 2024-10-06 NOTE — Telephone Encounter (Signed)
 Spoke with ER, they have contacted our group in the hospital.

## 2024-10-06 NOTE — ED Notes (Signed)
 MD at bedside.

## 2024-10-07 ENCOUNTER — Other Ambulatory Visit: Payer: Self-pay | Admitting: Cardiology

## 2024-10-07 ENCOUNTER — Telehealth: Payer: Self-pay

## 2024-10-07 ENCOUNTER — Encounter (HOSPITAL_COMMUNITY): Payer: Self-pay | Admitting: Cardiology

## 2024-10-07 ENCOUNTER — Telehealth: Payer: Self-pay | Admitting: Cardiology

## 2024-10-07 ENCOUNTER — Observation Stay (HOSPITAL_COMMUNITY)

## 2024-10-07 DIAGNOSIS — I2510 Atherosclerotic heart disease of native coronary artery without angina pectoris: Secondary | ICD-10-CM | POA: Diagnosis not present

## 2024-10-07 LAB — CBC
HCT: 47.5 % (ref 39.0–52.0)
Hemoglobin: 15.6 g/dL (ref 13.0–17.0)
MCH: 29.3 pg (ref 26.0–34.0)
MCHC: 32.8 g/dL (ref 30.0–36.0)
MCV: 89.1 fL (ref 80.0–100.0)
Platelets: 210 K/uL (ref 150–400)
RBC: 5.33 MIL/uL (ref 4.22–5.81)
RDW: 12.7 % (ref 11.5–15.5)
WBC: 8.2 K/uL (ref 4.0–10.5)
nRBC: 0 % (ref 0.0–0.2)

## 2024-10-07 LAB — BASIC METABOLIC PANEL WITH GFR
Anion gap: 8 (ref 5–15)
BUN: 21 mg/dL (ref 8–23)
CO2: 27 mmol/L (ref 22–32)
Calcium: 9.4 mg/dL (ref 8.9–10.3)
Chloride: 106 mmol/L (ref 98–111)
Creatinine, Ser: 1.34 mg/dL — ABNORMAL HIGH (ref 0.61–1.24)
GFR, Estimated: 60 mL/min — ABNORMAL LOW (ref >60)
Glucose, Bld: 96 mg/dL (ref 70–99)
Potassium: 3.8 mmol/L (ref 3.5–5.1)
Sodium: 141 mmol/L (ref 135–145)

## 2024-10-07 LAB — LIPID PANEL
Cholesterol: 71 mg/dL (ref 0–200)
HDL: 28 mg/dL — ABNORMAL LOW (ref >40)
LDL Cholesterol: 24 mg/dL (ref 0–99)
Total CHOL/HDL Ratio: 2.5 ratio
Triglycerides: 93 mg/dL (ref <150)
VLDL: 19 mg/dL (ref 0–40)

## 2024-10-07 LAB — HIV ANTIBODY (ROUTINE TESTING W REFLEX): HIV Screen 4th Generation wRfx: NONREACTIVE

## 2024-10-07 MED ORDER — REGADENOSON 0.4 MG/5ML IV SOLN
0.4000 mg | Freq: Once | INTRAVENOUS | Status: AC
  Administered 2024-10-07: 0.4 mg via INTRAVENOUS

## 2024-10-07 MED ORDER — REGADENOSON 0.4 MG/5ML IV SOLN
INTRAVENOUS | Status: AC
  Filled 2024-10-07: qty 5

## 2024-10-07 MED ORDER — TECHNETIUM TC 99M TETROFOSMIN IV KIT
10.0000 | PACK | Freq: Once | INTRAVENOUS | Status: AC
  Administered 2024-10-07: 10.5 via INTRAVENOUS

## 2024-10-07 NOTE — Plan of Care (Signed)
  Problem: Cardiac: Goal: Ability to achieve and maintain adequate cardiovascular perfusion will improve 10/07/2024 1106 by Gail Cathryne SAILOR, RN Outcome: Progressing 10/07/2024 1105 by Gail Cathryne SAILOR, RN Outcome: Progressing

## 2024-10-07 NOTE — Telephone Encounter (Signed)
 error

## 2024-10-07 NOTE — Progress Notes (Addendum)
     Shawn Chang presented for a  nuclear stress test today.  I Waddell DELENA Donath, PA-C, provided direct supervision and was present during the stress portion of the study today, which was completed without significant symptoms, immediate complications, or acute ST changes on ECG. There was T wave inversions noted in lead V3 with deepening TWI in V2.   Patient had no symptoms prior. There was mild wheezing noted on exam prior to Lexiscan  being administered. Confirmed with Dr. Elmira and he was okay with proceeding with exam. Patient did not have any shortness of breath during the exam. He reported only mild epigastric pain, headache.   This was originally ordered as a treadmill stress, however after confirming with Dr. Elmira it was intended to be ordered as a Lexiscan . We proceeded with Lexiscan  imaging.  Stress imaging is pending at this time.  Preliminary ECG findings may be listed in the chart, but the stress test result will not be finalized until perfusion imaging is complete.  Waddell DELENA Donath, PA-C  10/07/2024, 11:00 AM

## 2024-10-07 NOTE — Progress Notes (Signed)
 Mobility Specialist Progress Note:    10/07/24 1545  Mobility  Activity Ambulated independently  Level of Assistance Independent after set-up  Assistive Device None  Distance Ambulated (ft) 15 ft  Range of Motion/Exercises Active  Activity Response Tolerated well  Mobility Referral Yes  Mobility visit 1 Mobility  Mobility Specialist Start Time (ACUTE ONLY) 1545  Mobility Specialist Stop Time (ACUTE ONLY) 1552  Mobility Specialist Time Calculation (min) (ACUTE ONLY) 7 min   Received pt laying in bed agreeable to quick in room session. No c/o any symptoms. Pt moving and ambulating well w/ no issues. Returned pt to bed w/ all needs met.   Venetia Keel Mobility Specialist Please Neurosurgeon or Rehab Office at [phone]

## 2024-10-07 NOTE — Telephone Encounter (Signed)
 TC from [employer], sports coach at [facility]. She requested we schedule a hospital follow up for the patient hospital visit. Pt was admitted at the ED for chest pain. Advised cardiology is an appropriate setting for the patient to follow up at. Case manager still requested follow up. Appointment scheduled.

## 2024-10-07 NOTE — Plan of Care (Signed)
   Problem: Cardiac: Goal: Ability to achieve and maintain adequate cardiovascular perfusion will improve Outcome: Progressing

## 2024-10-07 NOTE — Telephone Encounter (Signed)
 Contacted [facility] at the hospital. Pt will see cardiology for follow up.

## 2024-10-07 NOTE — TOC CM/SW Note (Addendum)
 Transition of Care [facility]) - Inpatient Brief Assessment   Patient Details  Name: Shawn Chang MRN: [id number] Date of Birth: 03/15/1961  Transition of Care [facility]) CM/SW Contact:    Waddell Barnie Rama, RN Phone Number: 10/07/2024, 12:25 PM   Clinical Narrative: From home with spouse, has no  PCP- states goes to [facility] because he travels a lot, Urgent Care says they will call patient to set up an apt with one of their primary doctors and he has insurance on file, states has no HH services in place at this time or DME at home.  States he will drive him self  home at dc and family is support system, states gets medications from [facility] in [place].  Pta self ambulatory.   There are no ICM needs identified  at this time.  Please place consult for ICM needs.     Transition of Care Asessment: Insurance and Status: Insurance coverage has been reviewed Patient has primary care physician: No (goes [facility] due to travels a lot) Home environment has been reviewed: home with wife Prior level of function:: indep Prior/Current Home Services: No current home services Social Drivers of Health Review: SDOH reviewed no interventions necessary Readmission risk has been reviewed: Yes Transition of care needs: no transition of care needs at this time

## 2024-10-07 NOTE — Discharge Summary (Signed)
 Discharge Summary   Shawn Chang ID: Shawn Chang MRN: [id number]; DOB: [birth year]  Admit date: 10/06/2024 Discharge date: 10/07/2024  PCP:  Shawn Chang, Shawn Chang   [facility] Providers Cardiologist:  Newman JINNY Lawrence, MD     Discharge Diagnoses  Principal Problem:   Chest pain   Diagnostic Studies/Procedures   Lexiscan  myoview   _____________   History of Present Illness   Shawn Chang is a 63 y.o. male with a hx of CAD s/p CABG in 2016, hypertension, hyperlipidemia, GERD who was seen 10/06/2024 for the evaluation of chest pain at the request of Dr. Levander. Shawn Chang previously had CABG in 2016 with LIMA-LAD, lt radial-OM1, SVG-OM 2 and OM 3, SVG-PDA.   Most recent ischemic evaluation was a stress test in 02/2021 that showed a small sized, mild intensity, reversible perfusion defect in basal inferior/inferoseptal myocardium.  EF was 61%.  Overall felt to be low risk study.  Echocardiogram in 02/2021 showed EF 67% with grade 1 DD, moderate AI.   Shawn Chang had syncopal episode in 07/2023, felt to be orthostatic or due to vasovagal etiology.  Amlodipine  was discontinued and Imdur  was reduced.  Echocardiogram 09/10/2023 showed EF 55-60%, Shawn wall motion abnormalities, grade 1 DD, normal RV systolic function, mild MR, mild AI.  Cardiac monitor showed Shawn significant arrhythmias, Shawn Chang called the office with complaints of hypertension and amlodipine  was resumed at 2.5 mg daily   Shawn Chang was last seen by Dr. Lawrence in 09/2023.  At that time, Shawn Chang was doing well.   Shawn Chang presented to the ED on 10/06/2024.  Reported that at 2 AM, he had shortness of breath, sweating, left shoulder and hand pain, nausea.  Also had a lot of belching.  Initial vital signs showed BP 116/79, oxygen 99% on room air, heart rate 78 bpm. EKG showed normal sinus rhythm with heart rate 83 bpm, RBBB (old).  High-sensitivity troponin was negative x 2.  CBC within normal limits.  BMP with creatinine 1.33.  Chest x-ray  with Shawn active cardiopulmonary disease.   Reported he had been in his usual state of health recently.  About 2 weeks prior to admission, he had an episode of chest pressure and burping.  Last about 2 minutes then resolved.  He stays active through his job.  Often has to carry heavy things and walk around quite a bit for work.  He denied having chest pain with exertion.  He has had some shortness of breath when walking outside in the cold. Woke up about 2:30 AM the morning of admission with left shoulder and left hand pain.  He also felt nauseous and was burping quite a bit.  He had nausea, burping, and left shoulder pain came and went for a while.  He got up and started getting ready for work and continues to have episodes of nausea and burping.  When driving to work, he had left shoulder and left arm pain so he decided to come to the ED for evaluation.  Since being in the ED, he has been feeling well.  Shawn longer having any nausea, burping, left arm pain.       Hospital Course    L shoulder pain, nausea, burping History of CAD -- CABG in 2016 with LIMA-LAD, LT radial-OM1, SVG-OM 2 and OM 3, SVG-PDA.  Stress test in 02/2021 was low risk study -- presented with episode of chest pressure, somewhat atypical -- EKG with RBBB.  High-sensitivity troponin negative x 2. -- underwent lexiscan  myoview , results  pending at discharge but reviewed by Dr. Elmira with Shawn concerning findings -- continue aspirin  81 mg daily, Lipitor  40 mg daily, Imdur  30 mg daily, Toprol  all succinate 100 mg daily, amlodipine  5 mg daily   Hypertension -- BP well-controlled -- continue amlodipine  5 mg daily, Imdur  30 mg daily, losartan  100 mg daily, metoprolol  succinate 100 mg daily at home   Hyperlipidemia -- Continue lipitor  40 mg daily   Shawn Chang was seen by Dr. Elmira and deemed stable for discharge home. Follow up arranged in the office.    Did the Shawn Chang have an acute coronary syndrome (MI, NSTEMI, STEMI, etc) this  admission?:  Shawn                               Did the Shawn Chang have a percutaneous coronary intervention (stent / angioplasty)?:  Shawn.    Discharge Vitals Blood pressure 123/63, pulse 91, temperature (!) 97.4 F (36.3 C), temperature source Oral, resp. rate 18, height 5' 6 (1.676 m), weight 80.8 kg, SpO2 95%.  Filed Weights   10/06/24 0652 10/06/24 2209 10/07/24 0459  Weight: 78.9 kg 80.8 kg 80.8 kg    Labs & Radiologic Studies  CBC Recent Labs    10/06/24 0658 10/07/24 0309  WBC 8.7 8.2  HGB 15.7 15.6  HCT 47.7 47.5  MCV 89.7 89.1  PLT 218 210   Basic Metabolic Panel Recent Labs    87/91/74 0658 10/07/24 0309  NA 141 141  K 4.1 3.8  CL 102 106  CO2 26 27  GLUCOSE 118* 96  BUN 18 21  CREATININE 1.33* 1.34*  CALCIUM  9.7 9.4   Liver Function Tests Shawn results for input(s): AST, ALT, ALKPHOS, BILITOT, PROT, ALBUMIN  in the last 72 hours. Shawn results for input(s): LIPASE, AMYLASE in the last 72 hours. High Sensitivity Troponin:   Recent Labs  Lab 10/06/24 0658 10/06/24 0910  TROPONINIHS 4 4    Shawn results for input(s): TRNPT in the last 720 hours.  BNP Invalid input(s): POCBNP Shawn results for input(s): PROBNP in the last 72 hours.  Shawn results for input(s): BNP in the last 72 hours.  D-Dimer Shawn results for input(s): DDIMER in the last 72 hours. Hemoglobin A1C Shawn results for input(s): HGBA1C in the last 72 hours. Fasting Lipid Panel Recent Labs    10/07/24 0309  CHOL 71  HDL 28*  LDLCALC 24  TRIG 93  CHOLHDL 2.5   Shawn results found for: LIPOA  Thyroid Function Tests Shawn results for input(s): TSH, T4TOTAL, T3FREE, THYROIDAB in the last 72 hours.  Invalid input(s): FREET3 _____________  ECHOCARDIOGRAM COMPLETE Result Date: 10/06/2024    ECHOCARDIOGRAM REPORT   Shawn Chang Name:   Shawn Chang [facility] Date of Exam: 10/06/2024 Medical Rec #:  [id number]      Height:       66.0 in Accession #:    [id number]     Weight:       174.0 lb  Date of Birth:  08-09-1961       BSA:          1.885 m Shawn Chang Age:    63 years       BP:           134/86 mmHg Shawn Chang Gender: M              HR:           62 bpm. Exam Location:  Inpatient  Procedure: 2D Echo and Strain Analysis (Both Spectral and Color Flow Doppler            were utilized during procedure). Indications:    CAD  History:        Shawn Chang has prior history of Echocardiogram examinations. CAD;                 Prior CABG.  Sonographer:    Charmaine Gaskins Referring Phys: [id number] ROLLO JONELLE LOUDER IMPRESSIONS  1. Left ventricular ejection fraction, by estimation, is 55 to 60%. Left ventricular ejection fraction by 2D MOD biplane is 56.8 %. The left ventricle has normal function. The left ventricle has Shawn regional wall motion abnormalities. Left ventricular diastolic parameters are consistent with Grade I diastolic dysfunction (impaired relaxation). The average left ventricular global longitudinal strain is -17.5 %. The global longitudinal strain is abnormal.  2. Right ventricular systolic function is normal. The right ventricular size is normal. There is normal pulmonary artery systolic pressure. The estimated right ventricular systolic pressure is 24.9 mmHg.  3. The mitral valve is grossly normal. Trivial mitral valve regurgitation.  4. The aortic valve is tricuspid. Aortic valve regurgitation is mild. Aortic valve sclerosis/calcification is present, without any evidence of aortic stenosis. Aortic valve mean gradient measures 6.0 mmHg.  5. Aortic dilatation noted. There is borderline dilatation of the aortic root, measuring 37 mm.  6. The inferior vena cava is normal in size with greater than 50% respiratory variability, suggesting right atrial pressure of 3 mmHg. Comparison(s): Changes from prior study are noted. 09/10/2023: LVEF 55-60%. FINDINGS  Left Ventricle: Left ventricular ejection fraction, by estimation, is 55 to 60%. Left ventricular ejection fraction by 2D MOD biplane is 56.8 %. The left  ventricle has normal function. The left ventricle has Shawn regional wall motion abnormalities. The average left ventricular global longitudinal strain is -17.5 %. Strain was performed and the global longitudinal strain is abnormal. The left ventricular internal cavity size was normal in size. There is Shawn left ventricular hypertrophy. Left ventricular diastolic parameters are consistent with Grade I diastolic dysfunction (impaired relaxation). Indeterminate filling pressures. Right Ventricle: The right ventricular size is normal. Shawn increase in right ventricular wall thickness. Right ventricular systolic function is normal. There is normal pulmonary artery systolic pressure. The tricuspid regurgitant velocity is 2.34 m/s, and  with an assumed right atrial pressure of 3 mmHg, the estimated right ventricular systolic pressure is 24.9 mmHg. Left Atrium: Left atrial size was normal in size. Right Atrium: Right atrial size was normal in size. Pericardium: There is Shawn evidence of pericardial effusion. Mitral Valve: The mitral valve is grossly normal. Trivial mitral valve regurgitation. Tricuspid Valve: The tricuspid valve is grossly normal. Tricuspid valve regurgitation is mild. Aortic Valve: The aortic valve is tricuspid. Aortic valve regurgitation is mild. Aortic valve sclerosis/calcification is present, without any evidence of aortic stenosis. Aortic valve mean gradient measures 6.0 mmHg. Aortic valve peak gradient measures 13.2 mmHg. Aortic valve area, by VTI measures 2.17 cm. Pulmonic Valve: The pulmonic valve was normal in structure. Pulmonic valve regurgitation is not visualized. Aorta: Aortic dilatation noted. There is borderline dilatation of the aortic root, measuring 37 mm. Venous: The inferior vena cava is normal in size with greater than 50% respiratory variability, suggesting right atrial pressure of 3 mmHg. IAS/Shunts: Shawn atrial level shunt detected by color flow Doppler.  LEFT VENTRICLE PLAX 2D  Biplane EF (MOD) LVIDd:         5.60 cm         LV Biplane EF:   Left LVIDs:         3.30 cm                          ventricular LV PW:         0.90 cm                          ejection LV IVS:        0.80 cm                          fraction by LVOT diam:     2.30 cm                          2D MOD LV SV:         79                               biplane is LV SV Index:   42                               56.8 %. LVOT Area:     4.15 cm                                Diastology                                LV e' medial:    6.74 cm/s LV Volumes (MOD)               LV E/e' medial:  9.1 LV vol d, MOD    60.0 ml       LV e' lateral:   7.72 cm/s A2C:                           LV E/e' lateral: 7.9 LV vol d, MOD    89.8 ml A4C:                           2D Longitudinal LV vol s, MOD    24.2 ml       Strain A2C:                           2D Strain GLS   -16.9 % LV vol s, MOD    38.9 ml       (A4C): A4C:                           2D Strain GLS   -18.1 % LV SV MOD A2C:   35.8 ml       (A3C): LV SV MOD A4C:   89.8 ml       2D Strain GLS   -17.5 % LV SV MOD BP:    41.9 ml       (  A2C):                                2D Strain GLS   -17.5 %                                Avg: RIGHT VENTRICLE RV Basal diam:  2.20 cm RV Mid diam:    2.00 cm TAPSE (M-mode): 1.5 cm LEFT ATRIUM             Index        RIGHT ATRIUM           Index LA diam:        3.30 cm 1.75 cm/m   RA Area:     13.50 cm LA Vol (A2C):   39.0 ml 20.69 ml/m  RA Volume:   31.20 ml  16.55 ml/m LA Vol (A4C):   39.5 ml 20.96 ml/m LA Biplane Vol: 39.2 ml 20.80 ml/m  AORTIC VALVE AV Area (Vmax):    2.22 cm AV Area (Vmean):   2.47 cm AV Area (VTI):     2.17 cm AV Vmax:           182.00 cm/s AV Vmean:          109.000 cm/s AV VTI:            0.362 m AV Peak Grad:      13.2 mmHg AV Mean Grad:      6.0 mmHg LVOT Vmax:         97.40 cm/s LVOT Vmean:        64.900 cm/s LVOT VTI:          0.189 m LVOT/AV VTI ratio: 0.52  AORTA Ao Root diam: 3.70 cm Ao Asc diam:   3.50 cm MITRAL VALVE               TRICUSPID VALVE MV Area (PHT): 2.90 cm    TR Peak grad:   21.9 mmHg MV Decel Time: 262 msec    TR Vmax:        234.00 cm/s MV E velocity: 61.30 cm/s MV A velocity: 84.90 cm/s  SHUNTS MV E/A ratio:  0.72        Systemic VTI:  0.19 m                            Systemic Diam: 2.30 cm Vinie Maxcy MD Electronically signed by Vinie Maxcy MD Signature Date/Time: 10/06/2024/2:21:18 PM    Final    DG Chest 2 View Result Date: 10/06/2024 CLINICAL DATA:  Chest and abdominal pain. EXAM: CHEST - 2 VIEW COMPARISON:  06/30/2023 FINDINGS: The lungs are clear without focal pneumonia, edema, pneumothorax or pleural effusion. The cardiopericardial silhouette is within normal limits for size. Shawn acute bony abnormality. IMPRESSION: Shawn active cardiopulmonary disease. Electronically Signed   By: Camellia Candle M.D.   On: 10/06/2024 07:20    Disposition Pt is being discharged home today in good condition.  Follow-up Plans & Appointments  Discharge Instructions     Call MD for:  difficulty breathing, headache or visual disturbances   Complete by: As directed    Call MD for:  persistant dizziness or light-headedness   Complete by: As directed    Diet - low sodium heart healthy   Complete by: As directed  Increase activity slowly   Complete by: As directed        Discharge Medications Allergies as of 10/07/2024       Reactions   Other Shortness Of Breath   Dog dander: Allergy -induced asthma   Morphine  Nausea And Vomiting   Morphine  And Codeine Nausea And Vomiting        Medication List     TAKE these medications    Advair  HFA 230-21 MCG/ACT inhaler Generic drug: fluticasone -salmeterol Inhale 2 puffs into the lungs 2 (two) times daily. What changed:  when to take this reasons to take this   amLODipine  5 MG tablet Commonly known as: NORVASC  TAKE 1 TABLET BY MOUTH EVERY DAY   aspirin  EC 81 MG tablet Take 81 mg by mouth daily.   atorvastatin  40 MG  tablet Commonly known as: LIPITOR  TAKE 1 TABLET BY MOUTH EVERY DAY   cetirizine  10 MG tablet Commonly known as: ZyrTEC  Allergy  Take 1 tablet (10 mg total) by mouth daily.   isosorbide  mononitrate 30 MG 24 hr tablet Commonly known as: IMDUR  TAKE 1 TABLET BY MOUTH EVERY DAY   losartan  100 MG tablet Commonly known as: COZAAR  Take 1 tablet (100 mg total) by mouth every evening.   metoprolol  succinate 100 MG 24 hr tablet Commonly known as: TOPROL -XL Take 1 tablet (100 mg total) by mouth every morning. WITH OR IMMEDIATELY FOLLOWING A MEAL   nitroGLYCERIN  0.4 MG SL tablet Commonly known as: NITROSTAT  Place 1 tablet (0.4 mg total) under the tongue every 5 (five) minutes as needed for chest pain.   omeprazole 10 MG capsule Commonly known as: PRILOSEC Take 10 mg by mouth daily.         Outstanding Labs/Studies  N/a   Duration of Discharge Encounter: APP Time: 15 minutes   Signed, Manuelita Rummer, NP 10/07/2024, 4:25 PM

## 2024-10-07 NOTE — Plan of Care (Signed)
  Problem: Education: Goal: Understanding of cardiac disease, CV risk reduction, and recovery process will improve Outcome: Adequate for Discharge Goal: Individualized Educational Video(s) Outcome: Adequate for Discharge   Problem: Activity: Goal: Ability to tolerate increased activity will improve Outcome: Adequate for Discharge   Problem: Cardiac: Goal: Ability to achieve and maintain adequate cardiovascular perfusion will improve 10/07/2024 1612 by Shawn Cathryne SAILOR, RN Outcome: Adequate for Discharge 10/07/2024 1106 by Shawn Cathryne SAILOR, RN Outcome: Progressing 10/07/2024 1105 by Shawn Cathryne SAILOR, RN Outcome: Progressing   Problem: Health Behavior/Discharge Planning: Goal: Ability to safely manage health-related needs after discharge will improve Outcome: Adequate for Discharge   Problem: Education: Goal: Knowledge of General Education information will improve Description: Including pain rating scale, medication(s)/side effects and non-pharmacologic comfort measures Outcome: Adequate for Discharge   Problem: Health Behavior/Discharge Planning: Goal: Ability to manage health-related needs will improve Outcome: Adequate for Discharge   Problem: Clinical Measurements: Goal: Ability to maintain clinical measurements within normal limits will improve Outcome: Adequate for Discharge Goal: Will remain free from infection Outcome: Adequate for Discharge Goal: Diagnostic test results will improve Outcome: Adequate for Discharge Goal: Respiratory complications will improve Outcome: Adequate for Discharge Goal: Cardiovascular complication will be avoided Outcome: Adequate for Discharge   Problem: Activity: Goal: Risk for activity intolerance will decrease Outcome: Adequate for Discharge   Problem: Nutrition: Goal: Adequate nutrition will be maintained Outcome: Adequate for Discharge   Problem: Coping: Goal: Level of anxiety will decrease Outcome: Adequate for Discharge    Problem: Elimination: Goal: Will not experience complications related to bowel motility Outcome: Adequate for Discharge Goal: Will not experience complications related to urinary retention Outcome: Adequate for Discharge   Problem: Pain Managment: Goal: General experience of comfort will improve and/or be controlled Outcome: Adequate for Discharge   Problem: Safety: Goal: Ability to remain free from injury will improve Outcome: Adequate for Discharge   Problem: Skin Integrity: Goal: Risk for impaired skin integrity will decrease Outcome: Adequate for Discharge

## 2024-10-07 NOTE — TOC Transition Note (Signed)
 Transition of Care [facility]) - Discharge Note   Patient Details  Name: Shawn Chang MRN: [id number] Date of Birth: Jan 12, 1961  [facility] CM/SW Contact:  Waddell Barnie Rama, RN Phone Number: 10/07/2024, 4:16 PM   Clinical Narrative:    For dc home today, has no needs, [facility] will contact patient to set up primary doctor follow up with them.          Patient Goals and CMS Choice            Discharge Placement                       Discharge Plan and Services Additional resources added to the After Visit Summary for                                       Social Drivers of Health (SDOH) Interventions SDOH Screenings   Food Insecurity: No Food Insecurity (10/06/2024)  Housing: Low Risk  (10/06/2024)  Transportation Needs: No Transportation Needs (10/06/2024)  Utilities: Not At Risk (10/06/2024)  Tobacco Use: Medium Risk (10/07/2024)     Readmission Risk Interventions     No data to display

## 2024-10-08 LAB — LIPOPROTEIN A (LPA): Lipoprotein (a): 31 nmol/L — ABNORMAL HIGH (ref <75.0)

## 2024-10-09 ENCOUNTER — Observation Stay

## 2024-10-10 ENCOUNTER — Encounter: Payer: Self-pay | Admitting: Cardiology

## 2024-10-14 ENCOUNTER — Other Ambulatory Visit: Payer: Self-pay | Admitting: Cardiology

## 2024-10-14 DIAGNOSIS — I10 Essential (primary) hypertension: Secondary | ICD-10-CM

## 2024-10-15 MED ORDER — METOPROLOL SUCCINATE ER 100 MG PO TB24: 100.0000 mg | ORAL_TABLET | Freq: Every morning | ORAL | 0 refills | Status: AC

## 2024-10-20 ENCOUNTER — Other Ambulatory Visit: Payer: Self-pay | Admitting: Cardiology

## 2024-10-20 DIAGNOSIS — I2510 Atherosclerotic heart disease of native coronary artery without angina pectoris: Secondary | ICD-10-CM

## 2024-10-21 MED ORDER — ISOSORBIDE MONONITRATE ER 30 MG PO TB24: 30.0000 mg | ORAL_TABLET | Freq: Every day | ORAL | 0 refills | Status: DC

## 2024-10-28 ENCOUNTER — Telehealth: Payer: Self-pay | Admitting: Cardiology

## 2024-10-28 NOTE — Telephone Encounter (Signed)
 Thank you. Lyle, can you please follow up on this tomorrow? This result is overdue from the hospital.  Thanks MJP

## 2024-10-28 NOTE — Telephone Encounter (Signed)
 Left message for patient to call back

## 2024-10-28 NOTE — Telephone Encounter (Signed)
Patient called to follow-up on his test results. 

## 2024-10-28 NOTE — Telephone Encounter (Signed)
 Sent message to RT that was present during the stress test to see if Radiology can get this read. Pt updated via MyChart.

## 2024-10-28 NOTE — Telephone Encounter (Signed)
 It does not look like Dr. Elmira ordered this. Rollo, do you mind reviewing results.

## 2024-10-28 NOTE — Telephone Encounter (Signed)
 Patient is returning a phone call.

## 2024-10-28 NOTE — Progress Notes (Deleted)
" °  Cardiology Office Note   Date:  10/28/2024  ID:  Shawn Chang, DOB 09-18-1961, MRN [id number] PCP: Patient, No Pcp Per  [facility] Providers Cardiologist:  Newman PARAS Lawrence, MD { Click to update primary MD,subspecialty MD or APP then REFRESH:1}    History of Present Illness Shawn Chang is a 63 y.o. male with history of CAD s/p CABG in 2016 (LIMA-LAD, Lt radial -OM1, SVG- OM2,OM3, SVG-PDA), hypertension, hyperlipidemia, tobacco abuse, and exercise-induced asthma.     He had abnormal nuclear stress test and coronary calcium  score 2879 09/17/2015. Cardiac catheterization 10/01/2015 severe multi vessel disease. 10/08/2015 Underwent planned PCI to RCA that was aborted  and urgent CABG indicated. Carotid US  10/10/2015 bilateral patent carotid arteries. He underwent CABG 10/12/2015 with uneventful post operative recovery.   Stress test 02/28/2021 was low risk with small, mild intensity, reversible perfusion defect. Had some moderate exertional dyspnea in 2024 and recommended cardiac catheterization if symptoms persist. Unwitnessed syncopal episode 07/2023. EKG unremarkable, BP medications titrated down d/t weight loss, and heart monitor x2 weeks predominately NSR. He was last seen by Dr. Lawrence and noted to be doing well.   Presented to the ED 10/06/24 with SOB, diaphoresis, and left shoulder and hand pain. Cardiac workup unremarkable. Echo 10/06/24 LVEF 55-60%, no RWMA, grade 1 DD, RV normal, trivial mitral valve regurgitation, mild aortic regurgitation, and dilation of aortic root measuring 38 mm. He underwent lexiscan  Myoview  10/07/24 prior to discharge. Results reviewed by Dr. Lawrence with no concerning findings.     He presents today for hospital follow up in the setting of chest pain.    ROS: All systems negative unless otherwise indicated in HPI.   Studies Reviewed      *** Risk Assessment/Calculations {Does this patient have ATRIAL FIBRILLATION?:[id number]} No BP  recorded.  {Refresh Note OR Click here to enter BP  :1}***       Physical Exam VS:  There were no vitals taken for this visit.       Wt Readings from Last 3 Encounters:  10/07/24 178 lb 2.1 oz (80.8 kg)  01/28/24 179 lb 9.6 oz (81.5 kg)  10/05/23 176 lb (79.8 kg)    GEN: Well nourished, well developed in no acute distress NECK: No JVD; No carotid bruits CARDIAC: ***RRR, no murmurs, rubs, gallops RESPIRATORY:  Clear to auscultation without rales, wheezing or rhonchi  ABDOMEN: Soft, non-tender, non-distended EXTREMITIES:  No edema; No deformity   ASSESSMENT AND PLAN ***    {Are you ordering a CV Procedure (e.g. stress test, cath, DCCV, TEE, etc)?   Press F2        :[id number]}  Dispo: ***  Signed, Mardy KATHEE Pizza, FNP  "

## 2024-10-29 ENCOUNTER — Ambulatory Visit: Admitting: Physician Assistant

## 2024-10-29 LAB — NM MYOCAR MULTI W/SPECT W/WALL MOTION / EF
Base ST Depression (mm): 0 mm
Exercise duration (min): 5 min
Exercise duration (sec): 26 s
Peak HR: 104 {beats}/min
Rest HR: 64 {beats}/min
ST Depression (mm): 0 mm

## 2024-10-29 NOTE — Telephone Encounter (Signed)
 LVM and also sent [facility] message. 10/29/24 (Dr. Jeffrie has resulted the Nuc Med stress test; he was Reader B that day on 10/07/24) it was WNL.

## 2024-11-15 ENCOUNTER — Other Ambulatory Visit: Payer: Self-pay | Admitting: Cardiology

## 2024-11-15 DIAGNOSIS — I10 Essential (primary) hypertension: Secondary | ICD-10-CM

## 2024-11-16 ENCOUNTER — Other Ambulatory Visit: Payer: Self-pay | Admitting: Cardiology

## 2024-11-16 DIAGNOSIS — I2510 Atherosclerotic heart disease of native coronary artery without angina pectoris: Secondary | ICD-10-CM

## 2024-11-19 ENCOUNTER — Other Ambulatory Visit: Payer: Self-pay | Admitting: Cardiology

## 2024-11-25 MED ORDER — ATORVASTATIN CALCIUM 40 MG PO TABS: 40.0000 mg | ORAL_TABLET | Freq: Every day | ORAL | 0 refills | Status: AC

## 2024-12-01 ENCOUNTER — Ambulatory Visit: Admitting: Emergency Medicine

## 2024-12-01 ENCOUNTER — Telehealth: Payer: Self-pay | Admitting: Emergency Medicine

## 2024-12-01 ENCOUNTER — Encounter: Payer: Self-pay | Admitting: Emergency Medicine

## 2024-12-01 VITALS — BP 124/82 | HR 76 | Ht 66.0 in | Wt 173.0 lb

## 2024-12-01 DIAGNOSIS — E785 Hyperlipidemia, unspecified: Secondary | ICD-10-CM | POA: Diagnosis not present

## 2024-12-01 DIAGNOSIS — I2510 Atherosclerotic heart disease of native coronary artery without angina pectoris: Secondary | ICD-10-CM

## 2024-12-01 DIAGNOSIS — J45990 Exercise induced bronchospasm: Secondary | ICD-10-CM | POA: Diagnosis not present

## 2024-12-01 DIAGNOSIS — I10 Essential (primary) hypertension: Secondary | ICD-10-CM

## 2024-12-01 DIAGNOSIS — Z951 Presence of aortocoronary bypass graft: Secondary | ICD-10-CM | POA: Diagnosis not present

## 2024-12-01 MED ORDER — ISOSORBIDE MONONITRATE ER 30 MG PO TB24: 45.0000 mg | ORAL_TABLET | Freq: Every day | ORAL | 0 refills | Status: AC

## 2024-12-01 MED ORDER — NITROGLYCERIN 0.4 MG SL SUBL: 0.4000 mg | SUBLINGUAL_TABLET | SUBLINGUAL | 3 refills | Status: AC | PRN

## 2024-12-01 NOTE — Assessment & Plan Note (Signed)
 BP is well-controlled in office today He reports one episode in 2024 of hypotension where they decreased amlodipine  to 2.5 mg daily Given normal blood pressure in office and increase in Imdur  as above, will have patient hold low-dose amlodipine  to prevent recurrent hypotension. Patient is to keep track of BPs until his next visit and report any BPs consistently averaging <100 or >140 Hold amlodipine  2.5 mg daily until we observe BP trends on increased imdur  dosing, may resume if BP's consistently above goal of 130/80 Continue losartan  100 mg daily Continue metoprolol  succinate 100 mg daily

## 2024-12-01 NOTE — Assessment & Plan Note (Signed)
 Mild expiratory wheeze throughout, patient states is exacerbated by cold air Advised patient to schedule follow-up with pulmonology to better manage his exercise-induced asthma symptoms, none at rest Continue inhalers as prescribed Could consider titrating off beta-blocker if asthma symptoms persist at rest and normal climate to see if symptoms improve.
